# Patient Record
Sex: Female | Born: 1950
Health system: Southern US, Community
[De-identification: ages and names within clinical notes are randomized; demographics above are authoritative.]

## PROBLEM LIST (undated history)

## (undated) DIAGNOSIS — I2510 Atherosclerotic heart disease of native coronary artery without angina pectoris: Secondary | ICD-10-CM

## (undated) DIAGNOSIS — I10 Essential (primary) hypertension: Secondary | ICD-10-CM

## (undated) DIAGNOSIS — M8000XA Age-related osteoporosis with current pathological fracture, unspecified site, initial encounter for fracture: Secondary | ICD-10-CM

## (undated) DIAGNOSIS — C44222 Squamous cell carcinoma of skin of right ear and external auricular canal: Secondary | ICD-10-CM

## (undated) DIAGNOSIS — J449 Chronic obstructive pulmonary disease, unspecified: Secondary | ICD-10-CM

## (undated) DIAGNOSIS — M4850XA Collapsed vertebra, not elsewhere classified, site unspecified, initial encounter for fracture: Secondary | ICD-10-CM

## (undated) HISTORY — DX: Squamous cell carcinoma of skin of right ear and external auricular canal: C44.222

## (undated) HISTORY — DX: Collapsed vertebra, not elsewhere classified, site unspecified, initial encounter for fracture: M48.50XA

## (undated) HISTORY — PX: TONSILLECTOMY: SUR1361

## (undated) HISTORY — DX: Atherosclerotic heart disease of native coronary artery without angina pectoris: I25.10

## (undated) HISTORY — DX: Age-related osteoporosis with current pathological fracture, unspecified site, initial encounter for fracture: M80.00XA

## (undated) HISTORY — PX: EYE SURGERY: SHX253

---

## 2015-02-23 ENCOUNTER — Encounter (HOSPITAL_COMMUNITY): Payer: Self-pay

## 2015-02-23 ENCOUNTER — Observation Stay (HOSPITAL_COMMUNITY)
Admission: EM | Admit: 2015-02-23 | Discharge: 2015-02-24 | Disposition: A | Discharge status: Home / Self Care | Payer: Medicare Other | Attending: Internal Medicine | Admitting: Internal Medicine

## 2015-02-23 ENCOUNTER — Emergency Department (HOSPITAL_COMMUNITY): Payer: Medicare Other

## 2015-02-23 DIAGNOSIS — R109 Unspecified abdominal pain: Principal | ICD-10-CM | POA: Insufficient documentation

## 2015-02-23 DIAGNOSIS — E876 Hypokalemia: Secondary | ICD-10-CM | POA: Diagnosis not present

## 2015-02-23 DIAGNOSIS — Z87891 Personal history of nicotine dependence: Secondary | ICD-10-CM | POA: Diagnosis not present

## 2015-02-23 DIAGNOSIS — R740 Nonspecific elevation of levels of transaminase and lactic acid dehydrogenase [LDH]: Secondary | ICD-10-CM

## 2015-02-23 DIAGNOSIS — R55 Syncope and collapse: Secondary | ICD-10-CM | POA: Diagnosis not present

## 2015-02-23 DIAGNOSIS — R7401 Elevation of levels of liver transaminase levels: Secondary | ICD-10-CM

## 2015-02-23 DIAGNOSIS — Z87442 Personal history of urinary calculi: Secondary | ICD-10-CM | POA: Insufficient documentation

## 2015-02-23 LAB — CBC WITH DIFFERENTIAL/PLATELET
Basophils Absolute: 0 10*3/uL (ref 0.0–0.1)
Basophils Relative: 0 %
Eosinophils Absolute: 0 10*3/uL (ref 0.0–0.7)
Eosinophils Relative: 1 %
HCT: 39.7 % (ref 36.0–46.0)
Hemoglobin: 13.9 g/dL (ref 12.0–15.0)
Lymphocytes Relative: 4 %
Lymphs Abs: 0.2 10*3/uL — ABNORMAL LOW (ref 0.7–4.0)
MCH: 31 pg (ref 26.0–34.0)
MCHC: 35 g/dL (ref 30.0–36.0)
MCV: 88.4 fL (ref 78.0–100.0)
Monocytes Absolute: 0.1 10*3/uL (ref 0.1–1.0)
Monocytes Relative: 2 %
Neutro Abs: 4.1 10*3/uL (ref 1.7–7.7)
Neutrophils Relative %: 93 %
Platelets: 129 10*3/uL — ABNORMAL LOW (ref 150–400)
RBC: 4.49 MIL/uL (ref 3.87–5.11)
RDW: 12.8 % (ref 11.5–15.5)
WBC: 4.4 10*3/uL (ref 4.0–10.5)

## 2015-02-23 LAB — COMPREHENSIVE METABOLIC PANEL
ALT: 352 U/L — ABNORMAL HIGH (ref 14–54)
AST: 446 U/L — ABNORMAL HIGH (ref 15–41)
Albumin: 3.7 g/dL (ref 3.5–5.0)
Alkaline Phosphatase: 142 U/L — ABNORMAL HIGH (ref 38–126)
Anion gap: 11 (ref 5–15)
BUN: 18 mg/dL (ref 6–20)
CO2: 27 mmol/L (ref 22–32)
Calcium: 9 mg/dL (ref 8.9–10.3)
Chloride: 94 mmol/L — ABNORMAL LOW (ref 101–111)
Creatinine, Ser: 0.69 mg/dL (ref 0.44–1.00)
GFR calc Af Amer: >60 mL/min (ref >60)
GFR calc non Af Amer: >60 mL/min (ref >60)
Glucose, Bld: 138 mg/dL — ABNORMAL HIGH (ref 65–99)
Potassium: 3 mmol/L — ABNORMAL LOW (ref 3.5–5.1)
Sodium: 132 mmol/L — ABNORMAL LOW (ref 135–145)
Total Bilirubin: 1 mg/dL (ref 0.3–1.2)
Total Protein: 7.1 g/dL (ref 6.5–8.1)

## 2015-02-23 LAB — URINE MICROSCOPIC-ADD ON
Bacteria, UA: NONE SEEN
Squamous Epithelial / LPF: NONE SEEN
WBC, UA: NONE SEEN WBC/hpf (ref 0–5)

## 2015-02-23 LAB — URINALYSIS, ROUTINE W REFLEX MICROSCOPIC
Glucose, UA: NEGATIVE mg/dL
Hgb urine dipstick: NEGATIVE
Ketones, ur: 40 mg/dL — AB
Leukocytes, UA: NEGATIVE
Nitrite: NEGATIVE
Protein, ur: 30 mg/dL — AB
Specific Gravity, Urine: 1.02 (ref 1.005–1.030)
pH: 6 (ref 5.0–8.0)

## 2015-02-23 LAB — CBG MONITORING, ED: Glucose-Capillary: 130 mg/dL — ABNORMAL HIGH (ref 65–99)

## 2015-02-23 LAB — LACTIC ACID, PLASMA
Lactic Acid, Venous: 2.4 mmol/L (ref 0.5–2.0)
Lactic Acid, Venous: 1.7 mmol/L (ref 0.5–2.0)

## 2015-02-23 LAB — ETHANOL: Alcohol, Ethyl (B): <5 mg/dL (ref <5)

## 2015-02-23 LAB — LIPASE, BLOOD: Lipase: 25 U/L (ref 11–51)

## 2015-02-23 LAB — ACETAMINOPHEN LEVEL: Acetaminophen (Tylenol), Serum: <10 ug/mL — ABNORMAL LOW (ref 10–30)

## 2015-02-23 LAB — TROPONIN I: Troponin I: 0.04 ng/mL — ABNORMAL HIGH (ref <0.031)

## 2015-02-23 MED ORDER — ONDANSETRON HCL 4 MG PO TABS: 4.0000 mg | ORAL_TABLET | Freq: Four times a day (QID) | ORAL | Status: DC | PRN

## 2015-02-23 MED ORDER — POTASSIUM CHLORIDE 10 MEQ/100ML IV SOLN: 10.0000 meq | INTRAVENOUS | Status: DC

## 2015-02-23 MED ORDER — ONDANSETRON HCL 4 MG/2ML IJ SOLN: 4.0000 mg | Freq: Four times a day (QID) | INTRAMUSCULAR | Status: DC | PRN

## 2015-02-23 MED ORDER — ALPRAZOLAM 0.5 MG PO TABS
0.5000 mg | ORAL_TABLET | Freq: Once | ORAL | Status: AC
  Administered 2015-02-24: 0.5 mg via ORAL
  Filled 2015-02-23: qty 1

## 2015-02-23 MED ORDER — SODIUM CHLORIDE 0.9 % IV SOLN
INTRAVENOUS | Status: AC
  Administered 2015-02-23: via INTRAVENOUS

## 2015-02-23 MED ORDER — POTASSIUM CHLORIDE 10 MEQ/100ML IV SOLN
10.0000 meq | Freq: Once | INTRAVENOUS | Status: AC
  Administered 2015-02-23: 10 meq via INTRAVENOUS
  Filled 2015-02-23: qty 100

## 2015-02-23 MED ORDER — ENOXAPARIN SODIUM 40 MG/0.4ML ~~LOC~~ SOLN
40.0000 mg | SUBCUTANEOUS | Status: DC
  Administered 2015-02-24: 40 mg via SUBCUTANEOUS
  Filled 2015-02-23: qty 0.4

## 2015-02-23 MED ORDER — SODIUM CHLORIDE 0.9 % IV SOLN
INTRAVENOUS | Status: DC
  Administered 2015-02-23: 21:00:00 via INTRAVENOUS

## 2015-02-23 MED ORDER — SODIUM CHLORIDE 0.9 % IV SOLN
INTRAVENOUS | Status: DC
  Administered 2015-02-23 – 2015-02-24 (×2): via INTRAVENOUS

## 2015-02-23 MED ORDER — OXYCODONE HCL 5 MG PO TABS: 5.0000 mg | ORAL_TABLET | ORAL | Status: DC | PRN

## 2015-02-23 NOTE — ED Notes (Signed)
MD Thurnell Garbe at bedside.

## 2015-02-23 NOTE — ED Notes (Signed)
I was nauseated, and I have been having pain in my back and abdomen. Possible kidney stones. She has vomited twice and passed out 3 times. The fire department came out and her vital signs were good and we agreed to bring her here.

## 2015-02-23 NOTE — H&P (Signed)
PCP:   No primary care provider on file.   Chief Complaint:  Beth [name] out  HPI:   64 year old female who  has a past medical history of Kidney stone. today came to the hospital with chief complaint of passing out. Patient had 2 episodes of syncope today. The first episode occurred when she was getting up from bed and second episode when her husband and grandson were putting her back to bed. Each episode lasted only for a few seconds. There was no associated seizure-like activity, no tongue biting or loss of bladder or bowel control.  No history of seizures in the past. Patient has been having intermittent nausea and vomiting for past 2 days. She also had right flank pain which they thought was due to kidney stone and was getting Apple Cider vinegar twice a day along with lemon water as recommended by her niece who is herbalist.  She denies any fever. No history of heart problems. Patient does not take any antihypertensive medications.  In the ED lab work revealed transaminitis. And hypokalemia. Patient denies taking excessive Tylenol. Acetaminophen level was less than 10. Alcohol level negative.  Patient also had mild elevation of troponin and lactate of  2.4.   Allergies:   Allergies  Allergen Reactions  . Cefzil [Cefprozil] Other (See Comments)    Burning sensation in lower legs.       Past Medical History  Diagnosis Date  . Kidney stone     Past Surgical History  Procedure Laterality Date  . Tonsillectomy    . Eye surgery      Prior to Admission medications   Medication Sig Start Date End Date Taking? Authorizing Provider  acetaminophen (TYLENOL) 500 MG tablet Take 1,000 mg by mouth every 6 (six) hours as needed for mild pain.   Yes Historical Provider, MD    Social History:  reports that she has quit smoking. She does not have any smokeless tobacco history on file. She reports that she does not drink alcohol. Her drug history is not on file.  No family history on  file.  Filed Weights   02/23/15 1938  Weight: 56.7 kg (125 lb)    All the positives are listed in BOLD  Review of Systems:  HEENT: Headache, blurred vision, runny nose, sore throat Neck: Hypothyroidism, hyperthyroidism,,lymphadenopathy Chest : Shortness of breath, history of COPD, Asthma Heart : Chest pain, history of coronary arterey disease GI:  Nausea, vomiting, diarrhea, constipation, GERD GU: Dysuria, urgency, frequency of urination, hematuria Neuro: Stroke, seizures, syncope Psych: Depression, anxiety, hallucinations   Physical Exam: Blood pressure 113/68, pulse 86, temperature 98.4 F (36.9 C), temperature source Oral, resp. rate 18, height '5\' 4"'$  (1.626 m), weight 56.7 kg (125 lb), SpO2 96 %. Constitutional:   Patient is a well-developed and well-nourished female* in no acute distress and cooperative with exam. Head: Normocephalic and atraumatic Mouth: Mucus membranes moist Eyes: PERRL, EOMI, conjunctivae normal Neck: Supple, No Thyromegaly Cardiovascular: RRR, S1 normal, S2 normal Pulmonary/Chest: CTAB, no wheezes, rales, or rhonchi Abdominal: Soft. Non-tender, non-distended, bowel sounds are normal, no masses, organomegaly, or guarding present.  Neurological: A&O x3, Strength is normal and symmetric bilaterally, cranial nerve II-XII are grossly intact, no focal motor deficit, sensory intact to light touch bilaterally.  Extremities : No Cyanosis, Clubbing or Edema  Labs on Admission:  Basic Metabolic Panel:  Recent Labs Lab 02/23/15 2005  NA 132*  K 3.0*  CL 94*  CO2 27  GLUCOSE 138*  BUN 18  CREATININE 0.69  CALCIUM 9.0   Liver Function Tests:  Recent Labs Lab 02/23/15 2005  AST 446*  ALT 352*  ALKPHOS 142*  BILITOT 1.0  PROT 7.1  ALBUMIN 3.7    Recent Labs Lab 02/23/15 2005  LIPASE 25   No results for input(s): AMMONIA in the last 168 hours. CBC:  Recent Labs Lab 02/23/15 2005  WBC 4.4  NEUTROABS 4.1  HGB 13.9  HCT 39.7  MCV 88.4   PLT 129*   Cardiac Enzymes:  Recent Labs Lab 02/23/15 2005  TROPONINI 0.04*     CBG:  Recent Labs Lab 02/23/15 2015  GLUCAP 130*    Radiological Exams on Admission: Dg Chest 2 View  02/23/2015  CLINICAL DATA:  Nausea and vomiting.  Syncope today. EXAM: CHEST  2 VIEW COMPARISON:  Included lung bases from CT abdomen, performed concurrently. FINDINGS: The lungs are hyperinflated. Linear atelectasis in the right middle lobe. There is a prominent right epicardial fat pad. Heart size is normal. Mild tortuosity of the thoracic aorta. No pulmonary edema, confluent airspace disease, pleural effusion or pneumothorax. Mild compression deformity in the mid thoracic vertebra. IMPRESSION: 1. Hyperinflation.  Linear atelectasis in the right middle lobe. 2. Compression deformity in the midthoracic spine, age indeterminate Electronically Signed   By: Jeb Levering M.D.   On: 02/23/2015 21:02   Ct Renal Stone Study  02/23/2015  CLINICAL DATA:  Nausea and vomiting, bilateral flank pain radiating into mid lower back. EXAM: CT ABDOMEN AND PELVIS WITHOUT CONTRAST TECHNIQUE: Multidetector CT imaging of the abdomen and pelvis was performed following the standard protocol without IV contrast. COMPARISON:  None. FINDINGS: Liver, gallbladder, spleen, pancreas, and adrenal glands are within normal limits for a noncontrast study. Kidneys appear normal without stone or hydronephrosis. No ureteral or bladder calculi identified. Bladder is decompressed but grossly normal in appearance. Fairly extensive diverticulosis noted throughout the sigmoid and lower descending colon but no focal inflammatory change to suggest acute diverticulitis. Bowel is normal in caliber throughout. No bowel wall thickening or evidence of bowel wall inflammation. Appendix is normal. Heavy atherosclerotic changes seen along the walls of the normal-caliber abdominal aorta. No periaortic fluid or edema. No free fluid or abscess collections  seen. No free intraperitoneal air. No enlarged lymph nodes seen. Adnexal regions are unremarkable. Degenerative changes are seen throughout the thoracolumbar spine but no acute osseous abnormality. Mild scarring/ atelectasis noted at each lung base. IMPRESSION: 1. Colonic diverticulosis without evidence of acute diverticulitis. 2. Overall, no evidence of acute intra-abdominal or intrapelvic abnormality. No renal or ureteral calculi. No bowel obstruction or evidence of bowel wall inflammation. No free fluid. No free intraperitoneal air. No evidence of acute solid organ abnormality. Appendix is normal. 3. Degenerative changes of the thoracolumbar spine, moderate in degree. No acute osseous abnormality. 4. Prominent atherosclerotic calcifications of the abdominal aorta. No aortic aneurysm. Electronically Signed   By: Franki Cabot M.D.   On: 02/23/2015 21:03    EKG: Independently reviewed. Normal sinus rhythm   Assessment/Plan Active Problems:   Syncope   Transaminitis   Hypokalemia   Elevated troponin  Syncope  Patient presenting with syncope likely orthostatic from dehydration. We'll check orthostatic vital signs every shift. Will start IV fluid hydration normal saline at 125 mL per hour Check echocardiogram in a.m. Patient has mild elevation of troponin 0.04, will cycle the cardiac enzymes. EKG shows no significant abnormality.  Right flank pain Resolved,? Cause Patient denies pain at this time, CT abdomen pelvis shows no  kidney stone Continue oxycodone when necessary for pain  Elevated lactate Patient has mild elevation of lactate 2.4, likely from dehydration and hypotension. Will repeat lactic acid levels.  Transaminitis Patient has elevated ALT and AST along with alkaline phosphatase , CT abdomen showed no significant abnormality. Will check abdominal ultrasound in a.m.  Hypokalemia  Likely from nausea and vomiting and poor by mouth intake. Will replace potassium and check BMP in  a.m.  DVT prophylaxis Lovenox  Code status: Full code  Family discussion: Admission, patients condition and plan of care including tests being ordered have been discussed with the patient and her husband at bedside* who indicate understanding and agree with the plan and Code Status.   Time Spent on Admission: 60 min  [facility] Pager: [phone] 02/23/2015, 10:26 PM  If 7PM-7AM, please contact night-coverage  [email or web address]  Password [email or web address]

## 2015-02-23 NOTE — ED Notes (Signed)
CRITICAL VALUE ALERT  Critical value received:  Lactic Acid 2.4  Date of notification:  02/23/15  Time of notification:  2110  Critical value read back:Yes.    Nurse who received alert:  JRM  MD notified (1st page):  EDP  Time of first page:  2110  MD notified (2nd page):  Time of second page:  Responding MD:  EDP  Time MD responded:  2110

## 2015-02-23 NOTE — ED Provider Notes (Signed)
CSN: [id number]     Arrival date & time 02/23/15  1933 History   First MD Initiated Contact with Patient 02/23/15 1943     Chief Complaint  Patient presents with  . Loss of Consciousness  . Flank Pain  . Emesis     HPI  Pt was seen at [facility]. Per pt and her family, c/o sudden onset and resolution of 3 separate episodes of syncope that occurred today. Pt states for the past 2 weeks she has had intermittent low back and abd "pain" that she "thought was a kidney stone." Has been associated with several episodes of N/V/D, decreased PO intake, and generalized weakness. Pt's family states they went to check on her and "found her on the floor," s/p syncopal episode. Pt states she "just gets lightheaded" before she "passes out." Denies CP/palpitations, no SOB/cough, no black or blood in stools or emesis, no fevers, no rash, no focal motor weakness, no tingling/numbness in extremities, no slurred speech, no facial droop, no headache, no neck pain.    Past Medical History  Diagnosis Date  . Kidney stone    Past Surgical History  Procedure Laterality Date  . Tonsillectomy    . Eye surgery      Social History  Substance Use Topics  . Smoking status: Former Research scientist (life sciences)  . Smokeless tobacco: None  . Alcohol Use: No    Review of Systems ROS: Statement: All systems negative except as marked or noted in the HPI; Constitutional: Negative for fever and chills. ; ; Eyes: Negative for eye pain, redness and discharge. ; ; ENMT: Negative for ear pain, hoarseness, nasal congestion, sinus pressure and sore throat. ; ; Cardiovascular: Negative for chest pain, palpitations, diaphoresis, dyspnea and peripheral edema. ; ; Respiratory: Negative for cough, wheezing and stridor. ; ; Gastrointestinal: +N/V/D, abd pain. Negative for blood in stool, hematemesis, jaundice and rectal bleeding. . ; ; Genitourinary: Negative for dysuria and hematuria. ; ; Musculoskeletal: +LBP. Negative for neck pain. Negative for swelling and  deformity..; ; Skin: Negative for pruritus, rash, abrasions, blisters, bruising and skin lesion.; ; Neuro: Negative for headache and neck stiffness. Negative for extremity weakness, paresthesias, involuntary movement, seizure and +lightheadedness, +syncope.      Allergies  Cefzil  Home Medications   Prior to Admission medications   Not on File   BP 106/62 mmHg  Pulse 91  Temp(Src) 98.4 F (36.9 C) (Oral)  Resp 23  Ht '5\' 4"'$  (1.626 m)  Wt 125 lb (56.7 kg)  BMI 21.45 kg/m2  SpO2 97%   20:06 Orthostatic Vital Signs AH  Orthostatic Lying  - BP- Lying: 115/59 mmHg ; Pulse- Lying: 86  Orthostatic Sitting - BP- Sitting: 106/62 mmHg ; Pulse- Sitting: 90  Orthostatic Standing at 0 minutes - BP- Standing at 0 minutes: 110/57 mmHg ; Pulse- Standing at 0 minutes: 108       Physical Exam  1950: Physical examination:  Nursing notes reviewed; Vital signs and O2 SAT reviewed;  Constitutional: Well developed, Well nourished, In no acute distress; Head:  Normocephalic, atraumatic; Eyes: EOMI, PERRL, No scleral icterus; ENMT: Mouth and pharynx normal, Mucous membranes dry; Neck: Supple, Full range of motion, No lymphadenopathy; Cardiovascular: Regular rate and rhythm, No gallop; Respiratory: Breath sounds clear & equal bilaterally, No wheezes.  Speaking full sentences with ease, Normal respiratory effort/excursion; Chest: Nontender, Movement normal; Abdomen: Soft, Nontender, Nondistended, Normal bowel sounds; Genitourinary: No CVA tenderness; Spine:  No midline CS, TS, LS tenderness. No rash.;; Extremities: Pulses normal,  No tenderness, No edema, No calf edema or asymmetry.; Neuro: AA&Ox3, Major CN grossly intact. No facial droop. Speech clear. No gross focal motor or sensory deficits in extremities.; Skin: Color normal, Warm, Dry.   ED Course  Procedures (including critical care time) Labs Review   Imaging Review  I have personally reviewed and evaluated these images and lab results as part of  my medical decision-making.   EKG Interpretation   Date/Time:  Saturday February 23 2015 20:05:27 EST Ventricular Rate:  87 PR Interval:  117 QRS Duration: 99 QT Interval:  377 QTC Calculation: 453 R Axis:   75 Text Interpretation:  Sinus rhythm Borderline short PR interval No old  tracing to compare Confirmed by [facility]  MD, Nunzio Cory [phone]) on  02/23/2015 8:28:35 PM      MDM  MDM Reviewed: nursing note and vitals Interpretation: ECG, labs, x-ray and CT scan      Results for orders placed or performed during the hospital encounter of 02/23/15  Urinalysis, Routine w reflex microscopic  Result Value Ref Range   Color, Urine YELLOW YELLOW   APPearance CLEAR CLEAR   Specific Gravity, Urine 1.020 1.005 - 1.030   pH 6.0 5.0 - 8.0   Glucose, UA NEGATIVE NEGATIVE mg/dL   Hgb urine dipstick NEGATIVE NEGATIVE   Bilirubin Urine MODERATE (A) NEGATIVE   Ketones, ur 40 (A) NEGATIVE mg/dL   Protein, ur 30 (A) NEGATIVE mg/dL   Nitrite NEGATIVE NEGATIVE   Leukocytes, UA NEGATIVE NEGATIVE  Comprehensive metabolic panel  Result Value Ref Range   Sodium 132 (L) 135 - 145 mmol/L   Potassium 3.0 (L) 3.5 - 5.1 mmol/L   Chloride 94 (L) 101 - 111 mmol/L   CO2 27 22 - 32 mmol/L   Glucose, Bld 138 (H) 65 - 99 mg/dL   BUN 18 6 - 20 mg/dL   Creatinine, Ser 0.69 0.44 - 1.00 mg/dL   Calcium 9.0 8.9 - 10.3 mg/dL   Total Protein 7.1 6.5 - 8.1 g/dL   Albumin 3.7 3.5 - 5.0 g/dL   AST 446 (H) 15 - 41 U/L   ALT 352 (H) 14 - 54 U/L   Alkaline Phosphatase 142 (H) 38 - 126 U/L   Total Bilirubin 1.0 0.3 - 1.2 mg/dL   GFR calc non Af Amer >60 >60 mL/min   GFR calc Af Amer >60 >60 mL/min   Anion gap 11 5 - 15  Lipase, blood  Result Value Ref Range   Lipase 25 11 - 51 U/L  Troponin I  Result Value Ref Range   Troponin I 0.04 (H) <0.031 ng/mL  Lactic acid, plasma  Result Value Ref Range   Lactic Acid, Venous 2.4 (HH) 0.5 - 2.0 mmol/L  CBC with Differential  Result Value Ref Range   WBC 4.4  4.0 - 10.5 K/uL   RBC 4.49 3.87 - 5.11 MIL/uL   Hemoglobin 13.9 12.0 - 15.0 g/dL   HCT 39.7 36.0 - 46.0 %   MCV 88.4 78.0 - 100.0 fL   MCH 31.0 26.0 - 34.0 pg   MCHC 35.0 30.0 - 36.0 g/dL   RDW 12.8 11.5 - 15.5 %   Platelets 129 (L) 150 - 400 K/uL   Neutrophils Relative % 93 %   Neutro Abs 4.1 1.7 - 7.7 K/uL   Lymphocytes Relative 4 %   Lymphs Abs 0.2 (L) 0.7 - 4.0 K/uL   Monocytes Relative 2 %   Monocytes Absolute 0.1 0.1 - 1.0 K/uL  Eosinophils Relative 1 %   Eosinophils Absolute 0.0 0.0 - 0.7 K/uL   Basophils Relative 0 %   Basophils Absolute 0.0 0.0 - 0.1 K/uL  Urine microscopic-add on  Result Value Ref Range   Squamous Epithelial / LPF NONE SEEN NONE SEEN   WBC, UA NONE SEEN 0 - 5 WBC/hpf   RBC / HPF 0-5 0 - 5 RBC/hpf   Bacteria, UA NONE SEEN NONE SEEN   Urine-Other MUCOUS PRESENT   CBG monitoring, ED  Result Value Ref Range   Glucose-Capillary 130 (H) 65 - 99 mg/dL   Comment 1 Notify RN    Comment 2 Document in Chart    Dg Chest 2 View 02/23/2015  CLINICAL DATA:  Nausea and vomiting.  Syncope today. EXAM: CHEST  2 VIEW COMPARISON:  Included lung bases from CT abdomen, performed concurrently. FINDINGS: The lungs are hyperinflated. Linear atelectasis in the right middle lobe. There is a prominent right epicardial fat pad. Heart size is normal. Mild tortuosity of the thoracic aorta. No pulmonary edema, confluent airspace disease, pleural effusion or pneumothorax. Mild compression deformity in the mid thoracic vertebra. IMPRESSION: 1. Hyperinflation.  Linear atelectasis in the right middle lobe. 2. Compression deformity in the midthoracic spine, age indeterminate Electronically Signed   By: Jeb Levering M.D.   On: 02/23/2015 21:02   Ct Renal Stone Study 02/23/2015  CLINICAL DATA:  Nausea and vomiting, bilateral flank pain radiating into mid lower back. EXAM: CT ABDOMEN AND PELVIS WITHOUT CONTRAST TECHNIQUE: Multidetector CT imaging of the abdomen and pelvis was performed  following the standard protocol without IV contrast. COMPARISON:  None. FINDINGS: Liver, gallbladder, spleen, pancreas, and adrenal glands are within normal limits for a noncontrast study. Kidneys appear normal without stone or hydronephrosis. No ureteral or bladder calculi identified. Bladder is decompressed but grossly normal in appearance. Fairly extensive diverticulosis noted throughout the sigmoid and lower descending colon but no focal inflammatory change to suggest acute diverticulitis. Bowel is normal in caliber throughout. No bowel wall thickening or evidence of bowel wall inflammation. Appendix is normal. Heavy atherosclerotic changes seen along the walls of the normal-caliber abdominal aorta. No periaortic fluid or edema. No free fluid or abscess collections seen. No free intraperitoneal air. No enlarged lymph nodes seen. Adnexal regions are unremarkable. Degenerative changes are seen throughout the thoracolumbar spine but no acute osseous abnormality. Mild scarring/ atelectasis noted at each lung base. IMPRESSION: 1. Colonic diverticulosis without evidence of acute diverticulitis. 2. Overall, no evidence of acute intra-abdominal or intrapelvic abnormality. No renal or ureteral calculi. No bowel obstruction or evidence of bowel wall inflammation. No free fluid. No free intraperitoneal air. No evidence of acute solid organ abnormality. Appendix is normal. 3. Degenerative changes of the thoracolumbar spine, moderate in degree. No acute osseous abnormality. 4. Prominent atherosclerotic calcifications of the abdominal aorta. No aortic aneurysm. Electronically Signed   By: Franki Cabot M.D.   On: 02/23/2015 21:03    2145:  Potassium repleted IV. LFT's elevated, but pt denies overuse of etoh or APAP; additional labs ordered. Tropoin mildly elevated, but EKG without acute STTW changes and pt denies CP. Judicious IVF given for mildly elevated lactic acid. Dx and testing d/w pt and family.  Questions answered.   Verb understanding, agreeable to observation admit.  T/C to [facility] Dr. Darrick Meigs, case discussed, including:  HPI, pertinent PM/SHx, VS/PE, dx testing, ED course and treatment:  Agreeable to admit, requests to write temporary orders, obtain observation tele bed to team APAdmits.  Francine Graven, DO 02/26/15 2025

## 2015-02-24 ENCOUNTER — Observation Stay (HOSPITAL_COMMUNITY): Payer: Medicare Other

## 2015-02-24 DIAGNOSIS — R55 Syncope and collapse: Secondary | ICD-10-CM | POA: Diagnosis not present

## 2015-02-24 DIAGNOSIS — E876 Hypokalemia: Secondary | ICD-10-CM | POA: Diagnosis not present

## 2015-02-24 DIAGNOSIS — R740 Nonspecific elevation of levels of transaminase and lactic acid dehydrogenase [LDH]: Secondary | ICD-10-CM | POA: Diagnosis not present

## 2015-02-24 LAB — CBC
HCT: 41.8 % (ref 36.0–46.0)
Hemoglobin: 13.9 g/dL (ref 12.0–15.0)
MCH: 30 pg (ref 26.0–34.0)
MCHC: 33.3 g/dL (ref 30.0–36.0)
MCV: 90.1 fL (ref 78.0–100.0)
Platelets: 134 10*3/uL — ABNORMAL LOW (ref 150–400)
RBC: 4.64 MIL/uL (ref 3.87–5.11)
RDW: 13.2 % (ref 11.5–15.5)
WBC: 3.3 10*3/uL — ABNORMAL LOW (ref 4.0–10.5)

## 2015-02-24 LAB — COMPREHENSIVE METABOLIC PANEL
ALT: 363 U/L — ABNORMAL HIGH (ref 14–54)
AST: 424 U/L — ABNORMAL HIGH (ref 15–41)
Albumin: 3.5 g/dL (ref 3.5–5.0)
Alkaline Phosphatase: 125 U/L (ref 38–126)
Anion gap: 9 (ref 5–15)
BUN: 13 mg/dL (ref 6–20)
CO2: 23 mmol/L (ref 22–32)
Calcium: 8.5 mg/dL — ABNORMAL LOW (ref 8.9–10.3)
Chloride: 107 mmol/L (ref 101–111)
Creatinine, Ser: 0.65 mg/dL (ref 0.44–1.00)
GFR calc Af Amer: >60 mL/min (ref >60)
GFR calc non Af Amer: >60 mL/min (ref >60)
Glucose, Bld: 88 mg/dL (ref 65–99)
Potassium: 4.2 mmol/L (ref 3.5–5.1)
Sodium: 139 mmol/L (ref 135–145)
Total Bilirubin: 0.5 mg/dL (ref 0.3–1.2)
Total Protein: 6.8 g/dL (ref 6.5–8.1)

## 2015-02-24 LAB — TROPONIN I
Troponin I: 0.03 ng/mL (ref <0.031)
Troponin I: 0.03 ng/mL (ref <0.031)
Troponin I: <0.03 ng/mL (ref <0.031)

## 2015-02-24 LAB — MAGNESIUM: Magnesium: 1.8 mg/dL (ref 1.7–2.4)

## 2015-02-24 MED ORDER — POTASSIUM CHLORIDE CRYS ER 20 MEQ PO TBCR
40.0000 meq | EXTENDED_RELEASE_TABLET | ORAL | Status: AC
  Administered 2015-02-24 (×2): 40 meq via ORAL
  Filled 2015-02-24 (×2): qty 2

## 2015-02-24 NOTE — Discharge Instructions (Signed)
Discontinue use of Tylenol indefinitely.

## 2015-02-24 NOTE — Progress Notes (Signed)
Patient d/c home  before able to sign.

## 2015-02-24 NOTE — Discharge Summary (Signed)
Physician Discharge Summary  Beth [name] FAO:[id number] DOB: 08-03-1950 DOA: 02/23/2015  PCP: No primary care provider on file.  Admit date: 02/23/2015 Discharge date: 02/24/2015  Time spent: 45 minutes  Recommendations for Outpatient Follow-up:  -Will be discharged home today. -Advised to follow-up with primary care provider in 2 weeks at which time her liver function tests should be evaluated to ensure downward trend.   Discharge Diagnoses:  Active Problems:   Syncope   Transaminitis   Hypokalemia   Discharge Condition: Stable and improved  Filed Weights   02/23/15 1938 02/23/15 2342  Weight: 56.7 kg (125 lb) 56.7 kg (125 lb)    History of present illness:  As per Dr. Darrick Meigs 54/60: 64 year old female who  has a past medical history of Kidney stone. today came to the hospital with chief complaint of passing out. Patient had 2 episodes of syncope today. The first episode occurred when she was getting up from bed and second episode when her husband and grandson were putting her back to bed. Each episode lasted only for a few seconds. There was no associated seizure-like activity, no tongue biting or loss of bladder or bowel control.  No history of seizures in the past. Patient has been having intermittent nausea and vomiting for past 2 days. She also had right flank pain which they thought was due to kidney stone and was getting Apple Cider vinegar twice a day along with lemon water as recommended by her niece who is herbalist.  She denies any fever. No history of heart problems. Patient does not take any antihypertensive medications.  In the ED lab work revealed transaminitis. And hypokalemia. Patient denies taking excessive Tylenol. Acetaminophen level was less than 10. Alcohol level negative.  Patient also had mild elevation of troponin and lactate of 2.4.    Hospital Course:   Syncopal episode -Has had no arrhythmias on telemetry. -Suspect her syncope was related  to hypotension and orthostasis from dehydration. -Blood pressure has normalized, she is ambulating without dizziness or gait imbalance. -2-D echocardiogram has been done however results are pending at time of discharge, I do not anticipate any abnormalities given lack of findings on chest auscultation.  Transaminitis -Abdominal ultrasound shows fatty liver. -AST and ALT in the 400s and 300s respectively. -Suspect related to hypotension as well. -We'll need close follow-up in the outpatient setting. -Advised to cease Tylenol use.  Elevated lactic acid -Suspect related to dehydration and hypotension culminating in poor perfusion, resolved.  Procedures:  None   Consultations:  None  Discharge Instructions      Discharge Instructions    Increase activity slowly    Complete by:  As directed             Medication List    STOP taking these medications        acetaminophen 500 MG tablet  Commonly known as:  TYLENOL       Allergies  Allergen Reactions  . Cefzil [Cefprozil] Other (See Comments)    Burning sensation in lower legs.    Follow-up Information    Schedule an appointment as soon as possible for a visit in 2 weeks to follow up.   Why:  with your regular provider       The results of significant diagnostics from this hospitalization (including imaging, microbiology, ancillary and laboratory) are listed below for reference.    Significant Diagnostic Studies: Dg Chest 2 View  02/23/2015  CLINICAL DATA:  Nausea and vomiting.  Syncope today.  EXAM: CHEST  2 VIEW COMPARISON:  Included lung bases from CT abdomen, performed concurrently. FINDINGS: The lungs are hyperinflated. Linear atelectasis in the right middle lobe. There is a prominent right epicardial fat pad. Heart size is normal. Mild tortuosity of the thoracic aorta. No pulmonary edema, confluent airspace disease, pleural effusion or pneumothorax. Mild compression deformity in the mid thoracic vertebra.  IMPRESSION: 1. Hyperinflation.  Linear atelectasis in the right middle lobe. 2. Compression deformity in the midthoracic spine, age indeterminate Electronically Signed   By: Jeb Levering M.D.   On: 02/23/2015 21:02   US Abdomen Complete  02/24/2015  CLINICAL DATA:  Bilateral flank pain. Nausea and vomiting for 2 days. EXAM: ULTRASOUND ABDOMEN COMPLETE COMPARISON:  CT of the abdomen and pelvis 02/23/2015 FINDINGS: Gallbladder: No gallstones or wall thickening visualized. No sonographic Murphy sign noted. Maximal wall thickness is 1.8 mm, within normal limits. Common bile duct: Diameter: 1.8 mm, within normal limits Liver: The liver is mildly hyperechoic. No discrete lesions are present. IVC: No abnormality visualized. Pancreas: Visualized portion unremarkable. Spleen: The spleen is of normal size and echotexture measuring 7.6 cm maximally. Right Kidney: Length: 9.9 cm, within normal limits. Echogenicity within normal limits. No mass or hydronephrosis visualized. Left Kidney: Length: 9.9 cm, within normal limits. Echogenicity within normal limits. No mass or hydronephrosis visualized. Abdominal aorta: No aneurysm visualized. Other findings: None. IMPRESSION: 1. Mild fatty infiltration of liver. 2. No other acute or focal lesion to explain the patient's symptoms. Electronically Signed   By: San Morelle M.D.   On: 02/24/2015 14:07   Ct Renal Stone Study  02/23/2015  CLINICAL DATA:  Nausea and vomiting, bilateral flank pain radiating into mid lower back. EXAM: CT ABDOMEN AND PELVIS WITHOUT CONTRAST TECHNIQUE: Multidetector CT imaging of the abdomen and pelvis was performed following the standard protocol without IV contrast. COMPARISON:  None. FINDINGS: Liver, gallbladder, spleen, pancreas, and adrenal glands are within normal limits for a noncontrast study. Kidneys appear normal without stone or hydronephrosis. No ureteral or bladder calculi identified. Bladder is decompressed but grossly normal in  appearance. Fairly extensive diverticulosis noted throughout the sigmoid and lower descending colon but no focal inflammatory change to suggest acute diverticulitis. Bowel is normal in caliber throughout. No bowel wall thickening or evidence of bowel wall inflammation. Appendix is normal. Heavy atherosclerotic changes seen along the walls of the normal-caliber abdominal aorta. No periaortic fluid or edema. No free fluid or abscess collections seen. No free intraperitoneal air. No enlarged lymph nodes seen. Adnexal regions are unremarkable. Degenerative changes are seen throughout the thoracolumbar spine but no acute osseous abnormality. Mild scarring/ atelectasis noted at each lung base. IMPRESSION: 1. Colonic diverticulosis without evidence of acute diverticulitis. 2. Overall, no evidence of acute intra-abdominal or intrapelvic abnormality. No renal or ureteral calculi. No bowel obstruction or evidence of bowel wall inflammation. No free fluid. No free intraperitoneal air. No evidence of acute solid organ abnormality. Appendix is normal. 3. Degenerative changes of the thoracolumbar spine, moderate in degree. No acute osseous abnormality. 4. Prominent atherosclerotic calcifications of the abdominal aorta. No aortic aneurysm. Electronically Signed   By: Franki Cabot M.D.   On: 02/23/2015 21:03    Microbiology: No results found for this or any previous visit (from the past 240 hour(s)).   Labs: Basic Metabolic Panel:  Recent Labs Lab 02/23/15 2005 02/24/15 0333 02/24/15 0627  NA 132*  --  139  K 3.0*  --  4.2  CL 94*  --  107  CO2 27  --  23  GLUCOSE 138*  --  88  BUN 18  --  13  CREATININE 0.69  --  0.65  CALCIUM 9.0  --  8.5*  MG  --  1.8  --    Liver Function Tests:  Recent Labs Lab 02/23/15 2005 02/24/15 0627  AST 446* 424*  ALT 352* 363*  ALKPHOS 142* 125  BILITOT 1.0 0.5  PROT 7.1 6.8  ALBUMIN 3.7 3.5    Recent Labs Lab 02/23/15 2005  LIPASE 25   No results for  input(s): AMMONIA in the last 168 hours. CBC:  Recent Labs Lab 02/23/15 2005 02/24/15 0627  WBC 4.4 3.3*  NEUTROABS 4.1  --   HGB 13.9 13.9  HCT 39.7 41.8  MCV 88.4 90.1  PLT 129* 134*   Cardiac Enzymes:  Recent Labs Lab 02/23/15 2005 02/24/15 0333 02/24/15 0627 02/24/15 1205  TROPONINI 0.04* 0.03 0.03 <0.03   BNP: BNP (last 3 results) No results for input(s): BNP in the last 8760 hours.  ProBNP (last 3 results) No results for input(s): PROBNP in the last 8760 hours.  CBG:  Recent Labs Lab 02/23/15 2015  GLUCAP 130*       Signed:  HERNANDEZ ACOSTA,ESTELA  [facility] Pager: [phone] 02/24/2015, 3:16 PM

## 2015-02-24 NOTE — Progress Notes (Signed)
Discharged PT per MD order and protocol. Reviewed discharge teaching and handouts given. Pt verbalized understanding and left with all belongings. VSS. IV catheter D/C.  Patient wheeled down by staff member. Oswald Hillock, RN

## 2015-02-25 LAB — HEPATITIS PANEL, ACUTE
HCV Ab: <0.1 s/co ratio (ref 0.0–0.9)
Hep A IgM: NEGATIVE
Hep B C IgM: NEGATIVE
Hepatitis B Surface Ag: NEGATIVE

## 2015-12-09 DIAGNOSIS — Z23 Encounter for immunization: Secondary | ICD-10-CM | POA: Diagnosis not present

## 2017-01-25 ENCOUNTER — Ambulatory Visit (INDEPENDENT_AMBULATORY_CARE_PROVIDER_SITE_OTHER): Payer: Medicare Other | Admitting: Physician Assistant

## 2017-01-25 ENCOUNTER — Other Ambulatory Visit: Payer: Self-pay

## 2017-01-25 ENCOUNTER — Encounter: Payer: Self-pay | Admitting: Physician Assistant

## 2017-01-25 ENCOUNTER — Ambulatory Visit (INDEPENDENT_AMBULATORY_CARE_PROVIDER_SITE_OTHER): Payer: Medicare Other

## 2017-01-25 VITALS — BP 150/98 | HR 102 | Temp 98.0°F | Resp 16 | Ht 62.5 in | Wt 123.4 lb

## 2017-01-25 DIAGNOSIS — R059 Cough, unspecified: Secondary | ICD-10-CM

## 2017-01-25 DIAGNOSIS — R05 Cough: Secondary | ICD-10-CM | POA: Diagnosis not present

## 2017-01-25 DIAGNOSIS — J209 Acute bronchitis, unspecified: Secondary | ICD-10-CM | POA: Diagnosis not present

## 2017-01-25 MED ORDER — AZITHROMYCIN 250 MG PO TABS: ORAL_TABLET | ORAL | 0 refills | Status: DC

## 2017-01-25 MED ORDER — PREDNISONE 20 MG PO TABS: ORAL_TABLET | ORAL | 0 refills | Status: DC

## 2017-01-25 MED ORDER — BENZONATATE 100 MG PO CAPS: 100.0000 mg | ORAL_CAPSULE | Freq: Three times a day (TID) | ORAL | 0 refills | Status: DC | PRN

## 2017-01-25 NOTE — Progress Notes (Signed)
Beth [name]  MRN: [id number] DOB: September 30, 1950  PCP: Patient, No Pcp Per  Subjective:  Pt is a 66 year old female who presents to clinic for cough x > one week.  Endorses shob. She is taking chloricedin and tylenol. Denies fever, chills, night sweats, n/v.  H/o chronic bronchitis, no treatment.  She lives in [place] and is having a very hard time finding a PCP.   Review of Systems  Constitutional: Negative for chills, diaphoresis, fatigue and fever.  Respiratory: Positive for cough and shortness of breath. Negative for chest tightness and wheezing.   Cardiovascular: Negative for chest pain and palpitations.    Patient Active Problem List   Diagnosis Date Noted  . Faintness   . Syncope 02/23/2015  . Transaminitis 02/23/2015  . Hypokalemia 02/23/2015    Current Outpatient Medications on File Prior to Visit  Medication Sig Dispense Refill  . acetaminophen (TYLENOL) 500 MG tablet Take 500 mg every 6 (six) hours as needed by mouth.     No current facility-administered medications on file prior to visit.     Allergies  Allergen Reactions  . Cefzil [Cefprozil] Other (See Comments)    Burning sensation in lower legs.      Objective:  BP (!) 184/98   Pulse (!) 105   Temp 98 F (36.7 C) (Oral)   Resp 16   Ht 5' 2.5" (1.588 m)   Wt 123 lb 6.4 oz (56 kg)   SpO2 96%   BMI 22.21 kg/m   Physical Exam  Constitutional: She is oriented to person, place, and time and well-developed, well-nourished, and in no distress. No distress.  HENT:  Right Ear: Tympanic membrane normal.  Left Ear: Tympanic membrane normal.  Mouth/Throat: Oropharynx is clear and moist and mucous membranes are normal.  Cardiovascular: Normal rate, regular rhythm and normal heart sounds.  Pulmonary/Chest: Effort normal and breath sounds normal. She has no wheezes. She has no rales.  Neurological: She is alert and oriented to person, place, and time. GCS score is 15.  Skin: Skin is warm and dry.    Psychiatric: Mood, memory, affect and judgment normal.  Vitals reviewed.   Dg Chest 2 View  Result Date: 01/25/2017 CLINICAL DATA:  Cough for 10 days. EXAM: CHEST  2 VIEW COMPARISON:  02/23/2015 FINDINGS: The cardiac silhouette, mediastinal and hilar contours are normal in stable. There is tortuosity and calcification of the thoracic aorta. Prominent epicardial fat noted on the right side. The lungs are clear. No infiltrates, edema or effusions. No worrisome pulmonary lesions. Mild chronic emphysematous changes are noted with hyperinflation. The bony thorax is intact. Stable midthoracic compression deformities. IMPRESSION: Chronic emphysematous changes. No acute cardiopulmonary findings. Electronically Signed   By: Marijo Sanes M.D.   On: 01/25/2017 12:22    Assessment and Plan :  1. Cough - DG Chest 2 View; Future - predniSONE (DELTASONE) 20 MG tablet; Take 3 PO QAM x3days, 2 PO QAM x3days, 1 PO QAM x3days  Dispense: 18 tablet; Refill: 0 - benzonatate (TESSALON) 100 MG capsule; Take 1-2 capsules (100-200 mg total) 3 (three) times daily as needed by mouth for cough.  Dispense: 40 capsule; Refill: 0 2. Acute bronchitis, unspecified organism - azithromycin (ZITHROMAX) 250 MG tablet; Take 2 tabs PO x 1 dose, then 1 tab PO QD x 4 days  Dispense: 6 tablet; Refill: 0 - Negative chest x-ray. Worsening cough and tachycardia. Plan to cover with Azithromycin. Stay well hydrated. RTC in 5-7 days if no improvement. She  understands and agrees with plan.    Mercer Pod, PA-C  [facility] 01/25/2017 11:46 AM

## 2017-01-25 NOTE — Patient Instructions (Addendum)
Come back if you are not improving in 5-7 days. Stay well hydrated. Get lost of rest.  Take your blood pressure a few times a week. If it is >140/90, come back and we will start blood pressure medications (see below for DASH diet)  Advil or ibuprofen for pain. Do not take Aspirin.  Throat lozenges (if you are not at risk for choking) or sprays may be used to soothe your throat. Drink enough water and fluids to keep your urine clear or pale yellow. For sore throat: ? Gargle with 8 oz of salt water ( tsp of salt per 1 qt of water) as often as every 1-2 hours to soothe your throat.  Gargle liquid benadryl.  Use Elderberry syrup.   For sore throat try using a honey-based tea. Use 3 teaspoons of honey with juice squeezed from half lemon. Place shaved pieces of ginger into 1/2-1 cup of water and warm over stove top. Then mix the ingredients and repeat every 4 hours as needed.  Cough Syrup Recipe: Sweet Lemon & Honey Thyme  Ingredients a handful of fresh thyme sprigs   1 pint of water (2 cups)  1/2 cup honey (raw is best, but regular will do)  1/2 lemon chopped Instructions 1. Place the lemon in the pint jar and cover with the honey. The honey will macerate the lemons and draw out liquids which taste so delicious! 2. Meanwhile, toss the thyme leaves into a saucepan and cover them with the water. 3. Bring the water to a gentle simmer and reduce it to half, about a cup of tea. 4. When the tea is reduced and cooled a bit, strain the sprigs & leaves, add it into the pint jar and stir it well. 5. Give it a shake and use a spoonful as needed. 6. Store your homemade cough syrup in the refrigerator for about a month.  What causes a cough? In adults, common causes of a cough include: ?An infection of the airways or lungs (such as the common cold) ?Postnasal drip - Postnasal drip is when mucus from the nose drips down or flows along the back of the throat. Postnasal drip can happen when people  have: .A cold .Allergies .A sinus infection - The sinuses are hollow areas in the bones of the face that open into the nose. ?Lung conditions, like asthma and chronic obstructive pulmonary disease (COPD) - Both of these conditions can make it hard to breathe. COPD is usually caused by smoking. ?Acid reflux - Acid reflux is when the acid that is normally in your stomach backs up into your esophagus (the tube that carries food from your mouth to your stomach). ?A side effect from blood pressure medicines called "ACE inhibitors" ?Smoking cigarettes  Is there anything I can do on my own to get rid of my cough? Yes. To help get rid of your cough, you can: ?Use a humidifier in your bedroom ?Use an over-the-counter cough medicine, or suck on cough drops or hard candy ?Stop smoking, if you smoke ?If you have allergies, avoid the things you are allergic to (like pollen, dust, animals, or mold) If you have acid reflux, your doctor or nurse will tell you which lifestyle changes can help reduce symptoms.   DASH Eating Plan DASH stands for "Dietary Approaches to Stop Hypertension." The DASH eating plan is a healthy eating plan that has been shown to reduce high blood pressure (hypertension). It may also reduce your risk for type 2 diabetes, heart disease,  and stroke. The DASH eating plan may also help with weight loss. What are tips for following this plan? General guidelines  Avoid eating more than 2,300 mg (milligrams) of salt (sodium) a day. If you have hypertension, you may need to reduce your sodium intake to 1,500 mg a day.  Limit alcohol intake to no more than 1 drink a day for nonpregnant women and 2 drinks a day for men. One drink equals 12 oz of beer, 5 oz of wine, or 1 oz of hard liquor.  Work with your health care provider to maintain a healthy body weight or to lose weight. Ask what an ideal weight is for you.  Get at least 30 minutes of exercise that causes your heart to beat faster  (aerobic exercise) most days of the week. Activities may include walking, swimming, or biking.  Work with your health care provider or diet and nutrition specialist (dietitian) to adjust your eating plan to your individual calorie needs. Reading food labels  Check food labels for the amount of sodium per serving. Choose foods with less than 5 percent of the Daily Value of sodium. Generally, foods with less than 300 mg of sodium per serving fit into this eating plan.  To find whole grains, look for the word "whole" as the first word in the ingredient list. Shopping  Buy products labeled as "low-sodium" or "no salt added."  Buy fresh foods. Avoid canned foods and premade or frozen meals. Cooking  Avoid adding salt when cooking. Use salt-free seasonings or herbs instead of table salt or sea salt. Check with your health care provider or pharmacist before using salt substitutes.  Do not fry foods. Cook foods using healthy methods such as baking, boiling, grilling, and broiling instead.  Cook with heart-healthy oils, such as olive, canola, soybean, or sunflower oil. Meal planning   Eat a balanced diet that includes: ? 5 or more servings of fruits and vegetables each day. At each meal, try to fill half of your plate with fruits and vegetables. ? Up to 6-8 servings of whole grains each day. ? Less than 6 oz of lean meat, poultry, or fish each day. A 3-oz serving of meat is about the same size as a deck of cards. One egg equals 1 oz. ? 2 servings of low-fat dairy each day. ? A serving of nuts, seeds, or beans 5 times each week. ? Heart-healthy fats. Healthy fats called Omega-3 fatty acids are found in foods such as flaxseeds and coldwater fish, like sardines, salmon, and mackerel.  Limit how much you eat of the following: ? Canned or prepackaged foods. ? Food that is high in trans fat, such as fried foods. ? Food that is high in saturated fat, such as fatty meat. ? Sweets, desserts, sugary  drinks, and other foods with added sugar. ? Full-fat dairy products.  Do not salt foods before eating.  Try to eat at least 2 vegetarian meals each week.  Eat more home-cooked food and less restaurant, buffet, and fast food.  When eating at a restaurant, ask that your food be prepared with less salt or no salt, if possible. What foods are recommended? The items listed may not be a complete list. Talk with your dietitian about what dietary choices are best for you. Grains Whole-grain or whole-wheat bread. Whole-grain or whole-wheat pasta. Brown rice. [place]. Bulgur. Whole-grain and low-sodium cereals. Pita bread. Low-fat, low-sodium crackers. Whole-wheat flour tortillas. Vegetables Fresh or frozen vegetables (raw, steamed, roasted, or  grilled). Low-sodium or reduced-sodium tomato and vegetable juice. Low-sodium or reduced-sodium tomato sauce and tomato paste. Low-sodium or reduced-sodium canned vegetables. Fruits All fresh, dried, or frozen fruit. Canned fruit in natural juice (without added sugar). Meat and other protein foods Skinless chicken or [place]. Ground chicken or [place]. Pork with fat trimmed off. Fish and seafood. Egg whites. Dried beans, peas, or lentils. Unsalted nuts, nut butters, and seeds. Unsalted canned beans. Lean cuts of beef with fat trimmed off. Low-sodium, lean deli meat. Dairy Low-fat (1%) or fat-free (skim) milk. Fat-free, low-fat, or reduced-fat cheeses. Nonfat, low-sodium ricotta or cottage cheese. Low-fat or nonfat yogurt. Low-fat, low-sodium cheese. Fats and oils Soft margarine without trans fats. Vegetable oil. Low-fat, reduced-fat, or light mayonnaise and salad dressings (reduced-sodium). Canola, safflower, olive, soybean, and sunflower oils. Avocado. Seasoning and other foods Herbs. Spices. Seasoning mixes without salt. Unsalted popcorn and pretzels. Fat-free sweets. What foods are not recommended? The items listed may not be a complete list. Talk  with your dietitian about what dietary choices are best for you. Grains Baked goods made with fat, such as croissants, muffins, or some breads. Dry pasta or rice meal packs. Vegetables Creamed or fried vegetables. Vegetables in a cheese sauce. Regular canned vegetables (not low-sodium or reduced-sodium). Regular canned tomato sauce and paste (not low-sodium or reduced-sodium). Regular tomato and vegetable juice (not low-sodium or reduced-sodium). Angie Fava. Olives. Fruits Canned fruit in a light or heavy syrup. Fried fruit. Fruit in cream or butter sauce. Meat and other protein foods Fatty cuts of meat. Ribs. Fried meat. Berniece Salines. Sausage. Bologna and other processed lunch meats. Salami. Fatback. Hotdogs. Bratwurst. Salted nuts and seeds. Canned beans with added salt. Canned or smoked fish. Whole eggs or egg yolks. Chicken or [place] with skin. Dairy Whole or 2% milk, cream, and half-and-half. Whole or full-fat cream cheese. Whole-fat or sweetened yogurt. Full-fat cheese. Nondairy creamers. Whipped toppings. Processed cheese and cheese spreads. Fats and oils Butter. Stick margarine. Lard. Shortening. Ghee. Bacon fat. Tropical oils, such as coconut, palm kernel, or palm oil. Seasoning and other foods Salted popcorn and pretzels. Onion salt, garlic salt, seasoned salt, table salt, and sea salt. Worcestershire sauce. Tartar sauce. Barbecue sauce. Teriyaki sauce. Soy sauce, including reduced-sodium. Steak sauce. Canned and packaged gravies. Fish sauce. Oyster sauce. Cocktail sauce. Horseradish that you find on the shelf. Ketchup. Mustard. Meat flavorings and tenderizers. Bouillon cubes. Hot sauce and Tabasco sauce. Premade or packaged marinades. Premade or packaged taco seasonings. Relishes. Regular salad dressings. Where to find more information:  National Heart, Lung, and [place]: https://wilson-eaton.com/  American Heart Association: www.heart.org Summary  The DASH eating plan is a healthy eating plan  that has been shown to reduce high blood pressure (hypertension). It may also reduce your risk for type 2 diabetes, heart disease, and stroke.  With the DASH eating plan, you should limit salt (sodium) intake to 2,300 mg a day. If you have hypertension, you may need to reduce your sodium intake to 1,500 mg a day.  When on the DASH eating plan, aim to eat more fresh fruits and vegetables, whole grains, lean proteins, low-fat dairy, and heart-healthy fats.  Work with your health care provider or diet and nutrition specialist (dietitian) to adjust your eating plan to your individual calorie needs. This information is not intended to replace advice given to you by your health care provider. Make sure you discuss any questions you have with your health care provider. Document Released: 02/12/2011 Document Revised: 02/17/2016 Document Reviewed: 02/17/2016 Elsevier  Interactive Patient Education  2017 [employer].   IF you received an x-ray today, you will receive an invoice from [facility]. Please contact [facility] at [phone] with questions or concerns regarding your invoice.   IF you received labwork today, you will receive an invoice from [facility]. Please contact [facility] at [phone] with questions or concerns regarding your invoice.   Our billing staff will not be able to assist you with questions regarding bills from these companies.  You will be contacted with the lab results as soon as they are available. The fastest way to get your results is to activate your My Chart account. Instructions are located on the last page of this paperwork. If you have not heard from [place] regarding the results in 2 weeks, please contact this office.

## 2017-02-12 ENCOUNTER — Encounter: Payer: Self-pay | Admitting: Physician Assistant

## 2017-02-18 ENCOUNTER — Other Ambulatory Visit: Payer: Self-pay

## 2017-02-18 ENCOUNTER — Ambulatory Visit (INDEPENDENT_AMBULATORY_CARE_PROVIDER_SITE_OTHER): Payer: Medicare Other

## 2017-02-18 ENCOUNTER — Encounter: Payer: Self-pay | Admitting: Family Medicine

## 2017-02-18 ENCOUNTER — Ambulatory Visit (INDEPENDENT_AMBULATORY_CARE_PROVIDER_SITE_OTHER): Payer: Medicare Other | Admitting: Family Medicine

## 2017-02-18 VITALS — BP 150/100 | HR 119 | Temp 98.3°F | Resp 16 | Ht 62.5 in | Wt 125.2 lb

## 2017-02-18 DIAGNOSIS — I160 Hypertensive urgency: Secondary | ICD-10-CM | POA: Diagnosis not present

## 2017-02-18 DIAGNOSIS — Z1239 Encounter for other screening for malignant neoplasm of breast: Secondary | ICD-10-CM

## 2017-02-18 DIAGNOSIS — M4850XA Collapsed vertebra, not elsewhere classified, site unspecified, initial encounter for fracture: Secondary | ICD-10-CM

## 2017-02-18 DIAGNOSIS — M549 Dorsalgia, unspecified: Secondary | ICD-10-CM

## 2017-02-18 DIAGNOSIS — Z1231 Encounter for screening mammogram for malignant neoplasm of breast: Secondary | ICD-10-CM

## 2017-02-18 DIAGNOSIS — Z9189 Other specified personal risk factors, not elsewhere classified: Secondary | ICD-10-CM | POA: Diagnosis not present

## 2017-02-18 DIAGNOSIS — Z780 Asymptomatic menopausal state: Secondary | ICD-10-CM

## 2017-02-18 DIAGNOSIS — R740 Nonspecific elevation of levels of transaminase and lactic acid dehydrogenase [LDH]: Secondary | ICD-10-CM | POA: Diagnosis not present

## 2017-02-18 DIAGNOSIS — M4804 Spinal stenosis, thoracic region: Secondary | ICD-10-CM | POA: Diagnosis not present

## 2017-02-18 DIAGNOSIS — Z13820 Encounter for screening for osteoporosis: Secondary | ICD-10-CM | POA: Diagnosis not present

## 2017-02-18 DIAGNOSIS — R7401 Elevation of levels of liver transaminase levels: Secondary | ICD-10-CM

## 2017-02-18 HISTORY — DX: Collapsed vertebra, not elsewhere classified, site unspecified, initial encounter for fracture: M48.50XA

## 2017-02-18 LAB — POCT CBC
Granulocyte percent: 70.5 %G (ref 37–80)
HCT, POC: 43.2 % (ref 37.7–47.9)
Hemoglobin: 14.3 g/dL (ref 12.2–16.2)
Lymph, poc: 1.9 (ref 0.6–3.4)
MCH, POC: 30.1 pg (ref 27–31.2)
MCHC: 33.2 g/dL (ref 31.8–35.4)
MCV: 90.6 fL (ref 80–97)
MID (cbc): 0.3 (ref 0–0.9)
MPV: 7.2 fL (ref 0–99.8)
POC Granulocyte: 5.4 (ref 2–6.9)
POC LYMPH PERCENT: 25.1 %L (ref 10–50)
POC MID %: 4.4 %M (ref 0–12)
Platelet Count, POC: 261 10*3/uL (ref 142–424)
RBC: 4.77 M/uL (ref 4.04–5.48)
RDW, POC: 13.9 %
WBC: 7.7 10*3/uL (ref 4.6–10.2)

## 2017-02-18 LAB — POC MICROSCOPIC URINALYSIS (UMFC): Mucus: ABSENT

## 2017-02-18 LAB — POCT URINALYSIS DIP (MANUAL ENTRY)
Blood, UA: NEGATIVE
Glucose, UA: NEGATIVE mg/dL
Leukocytes, UA: NEGATIVE
Nitrite, UA: NEGATIVE
Protein Ur, POC: NEGATIVE mg/dL
Spec Grav, UA: >=1.03 — AB (ref 1.010–1.025)
Urobilinogen, UA: 0.2 E.U./dL
pH, UA: 5.5 (ref 5.0–8.0)

## 2017-02-18 MED ORDER — CLONIDINE HCL 0.1 MG PO TABS
0.1000 mg | ORAL_TABLET | Freq: Once | ORAL | Status: AC
  Administered 2017-02-18: 0.1 mg via ORAL

## 2017-02-18 MED ORDER — HYDROCODONE-ACETAMINOPHEN 5-325 MG PO TABS: 1.0000 | ORAL_TABLET | Freq: Four times a day (QID) | ORAL | 0 refills | Status: DC | PRN

## 2017-02-18 MED ORDER — TIZANIDINE HCL 4 MG PO TABS: 4.0000 mg | ORAL_TABLET | Freq: Four times a day (QID) | ORAL | 0 refills | Status: DC | PRN

## 2017-02-18 NOTE — Progress Notes (Signed)
Subjective:    Patient ID: Beth [name], female    DOB: 1950/06/24, 66 y.o.   MRN: [id number] Chief Complaint  Patient presents with  . Back Pain    upper mid back for a couple weeks has gotten worse in last few days , pressure under left eye on/off x 2 wks    HPI  Beth [name] is a 66 yo pt who is new to me. She was seen at our office once last mo for an acute cough diagnosed as bronchitis and was complaining with upper back pain at that time which initially felt better after bronchitis treatment but then 2 weeks later began to worsen again so she was advised to add tylenol into her prn advil, keep well hydrated, and try self massage with a tennis ball or foam roller after a heating pad.   She has never had any back issues until she became ill with bronchitis last mo.   Trying to cut down on smoking  HTN: Monitors BP at home - several days ago BP was 139/83.  Has to grip sides of ribs to help with pain   H/o transaminitis  Has been taking 4 regular tylenol a day along with 4 advil a day for the last several weeks.   Past Medical History:  Diagnosis Date  . Kidney stone    Past Surgical History:  Procedure Laterality Date  . EYE SURGERY    . TONSILLECTOMY     Current Outpatient Medications on File Prior to Visit  Medication Sig Dispense Refill  . acetaminophen (TYLENOL) 500 MG tablet Take 500 mg every 6 (six) hours as needed by mouth.    Marland Kitchen azithromycin (ZITHROMAX) 250 MG tablet Take 2 tabs PO x 1 dose, then 1 tab PO QD x 4 days (Patient not taking: Reported on 02/18/2017) 6 tablet 0  . benzonatate (TESSALON) 100 MG capsule Take 1-2 capsules (100-200 mg total) 3 (three) times daily as needed by mouth for cough. (Patient not taking: Reported on 02/18/2017) 40 capsule 0  . predniSONE (DELTASONE) 20 MG tablet Take 3 PO QAM x3days, 2 PO QAM x3days, 1 PO QAM x3days (Patient not taking: Reported on 02/18/2017) 18 tablet 0   No current facility-administered medications on file prior  to visit.    Allergies  Allergen Reactions  . Cefzil [Cefprozil] Other (See Comments)    Burning sensation in lower legs.    History reviewed. No pertinent family history. Social History   Socioeconomic History  . Marital status: Married    Spouse name: None  . Number of children: None  . Years of education: None  . Highest education level: None  Social Needs  . Financial resource strain: None  . Food insecurity - worry: None  . Food insecurity - inability: None  . Transportation needs - medical: None  . Transportation needs - non-medical: None  Occupational History  . None  Tobacco Use  . Smoking status: Former Research scientist (life sciences)  . Smokeless tobacco: Never Used  Substance and Sexual Activity  . Alcohol use: No  . Drug use: No  . Sexual activity: None  Other Topics Concern  . None  Social History Narrative  . None   Depression screen [facility] 2/9 02/18/2017 01/25/2017  Decreased Interest 0 0  Down, Depressed, Hopeless 0 0  PHQ - 2 Score 0 0    Review of Systems See hpi    Objective:   Physical Exam  Constitutional: She is oriented to person, place, and time.  She appears well-developed and well-nourished. No distress.  HENT:  Head: Normocephalic and atraumatic.  Right Ear: External ear normal.  Left Ear: External ear normal.  Eyes: Conjunctivae are normal. No scleral icterus.  Neck: Normal range of motion. Neck supple. No thyromegaly present.  Cardiovascular: Regular rhythm, normal heart sounds and intact distal pulses. Tachycardia present.  Pulmonary/Chest: Effort normal and breath sounds normal. No respiratory distress.  Musculoskeletal: She exhibits no edema.  Lymphadenopathy:    She has no cervical adenopathy.  Neurological: She is alert and oriented to person, place, and time.  Skin: Skin is warm and dry. She is not diaphoretic. No erythema.  Psychiatric: She has a normal mood and affect. Her behavior is normal.      Pulse (!) 119   Temp 98.3 F (36.8 C)    Resp 16   Ht 5' 2.5" (1.588 m)   Wt 125 lb 3.2 oz (56.8 kg)   SpO2 92%   BMI 22.53 kg/m  EKG: Sinus tach, no acute ischemic changes noted. No significant change noted when compared to prior EKG done 02/23/2015.   I have personally reviewed the EKG tracing and agree with the computer interpretation of Sinus tachycardia - short PR syndrome PRi=114. Low voltage with rightward P-axis and rotation - possible pulmonary disease ABNORMAL.  Results for orders placed or performed in visit on 02/18/17  POCT CBC  Result Value Ref Range   WBC 7.7 4.6 - 10.2 K/uL   Lymph, poc 1.9 0.6 - 3.4   POC LYMPH PERCENT 25.1 10 - 50 %L   MID (cbc) 0.3 0 - 0.9   POC MID % 4.4 0 - 12 %M   POC Granulocyte 5.4 2 - 6.9   Granulocyte percent 70.5 37 - 80 %G   RBC 4.77 4.04 - 5.48 M/uL   Hemoglobin 14.3 12.2 - 16.2 g/dL   HCT, POC 43.2 37.7 - 47.9 %   MCV 90.6 80 - 97 fL   MCH, POC 30.1 27 - 31.2 pg   MCHC 33.2 31.8 - 35.4 g/dL   RDW, POC 13.9 %   Platelet Count, POC 261 142 - 424 K/uL   MPV 7.2 0 - 99.8 fL  POCT urinalysis dipstick  Result Value Ref Range   Color, UA yellow yellow   Clarity, UA clear clear   Glucose, UA negative negative mg/dL   Bilirubin, UA small (A) negative   Ketones, POC UA small (15) (A) negative mg/dL   Spec Grav, UA >=1.030 (A) 1.010 - 1.025   Blood, UA negative negative   pH, UA 5.5 5.0 - 8.0   Protein Ur, POC negative negative mg/dL   Urobilinogen, UA 0.2 0.2 or 1.0 E.U./dL   Nitrite, UA Negative Negative   Leukocytes, UA Negative Negative  POCT Microscopic Urinalysis ([facility])  Result Value Ref Range   WBC,UR,HPF,POC Few (A) None WBC/hpf   RBC,UR,HPF,POC None None RBC/hpf   Bacteria Few (A) None, Too numerous to count   Mucus Absent Absent   Epithelial Cells, UR Per Microscopy Few (A) None, Too numerous to count cells/hpf   Crystals Moderate        Assessment & Plan:   1. Hypertensive urgency   2. Mid-back pain, acute   3. Transaminitis   4. Non-traumatic  compression fracture of vertebral column, initial encounter ([place])   5. Screening for breast cancer   6. Screening for osteoporosis   7. At high risk for osteoporosis   8. Postmenopausal estrogen deficiency  Orders Placed This Encounter  Procedures  . DG Thoracic Spine 2 View    Standing Status:   Future    Number of Occurrences:   1    Standing Expiration Date:   02/18/2018    Order Specific Question:   Reason for Exam (SYMPTOM  OR DIAGNOSIS REQUIRED)    Answer:   acute bilateral mid back pain - around T10-T12 - numerous risk factors for osteoporosis/compression frx    Order Specific Question:   Preferred imaging location?    Answer:   [facility]  . DG Bone Density     02/19/17-SPOKE W/ JOHN @ [facility] HE IS GOING TO HAVE THE ORDER CORRECTED AND RESENT-BC    Standing Status:   Future    Standing Expiration Date:   04/21/2018    Order Specific Question:   Reason for Exam (SYMPTOM  OR DIAGNOSIS REQUIRED)    Answer:   3 thoracic compression fracture, screen for osteoporosis, postmenopausal estrogen deficiency    Order Specific Question:   Preferred imaging location?    Answer:   [facility]  . MM Digital Screening    Standing Status:   Future    Standing Expiration Date:   04/21/2018    Order Specific Question:   Reason for Exam (SYMPTOM  OR DIAGNOSIS REQUIRED)    Answer:   screening for breast cancer    Order Specific Question:   Preferred imaging location?    Answer:   [facility]  . Comprehensive metabolic panel  . Ambulatory referral to Orthopedic Surgery    Referral Priority:   Medium    Referral Type:   Surgical    Referral Reason:   Specialty Services Required    Requested Specialty:   Orthopedic Surgery    Number of Visits Requested:   1  . POCT CBC  . POCT urinalysis dipstick  . POCT Microscopic Urinalysis ([facility])  . EKG 12-Lead    Meds ordered this encounter  Medications  . cloNIDine (CATAPRES) tablet 0.1 mg  . HYDROcodone-acetaminophen (NORCO/VICODIN) 5-325  MG tablet    Sig: Take 1 tablet by mouth every 6 (six) hours as needed for moderate pain.    Dispense:  20 tablet    Refill:  0  . tiZANidine (ZANAFLEX) 4 MG tablet    Sig: Take 1 tablet (4 mg total) by mouth every 6 (six) hours as needed for muscle spasms.    Dispense:  30 tablet    Refill:  0     Delman Cheadle, M.D.  Primary Care at [facility] [address] [place], [place] [address] [phone] phone [phone] fax  02/21/17 10:27 AM

## 2017-02-18 NOTE — Patient Instructions (Addendum)
You need to schedule your mammogram and your DEXA bone scan.  Please call [facility] at [phone] to schedule.    IF you received an x-ray today, you will receive an invoice from [facility]. Please contact [facility] at [phone] with questions or concerns regarding your invoice.   IF you received labwork today, you will receive an invoice from [facility]. Please contact [facility] at [phone] with questions or concerns regarding your invoice.   Our billing staff will not be able to assist you with questions regarding bills from these companies.  You will be contacted with the lab results as soon as they are available. The fastest way to get your results is to activate your My Chart account. Instructions are located on the last page of this paperwork. If you have not heard from [place] regarding the results in 2 weeks, please contact this office.       Spinal Compression Fracture A spinal compression fracture is a collapse of the bones that form the spine (vertebrae). With this type of fracture, the vertebrae become squashed (compressed) into a wedge shape. Most compression fractures happen in the middle or lower part of the spine. What are the causes? This condition may be caused by:  Thinning and loss of density in the bones (osteoporosis). This is the most common cause.  A fall.  A car or motorcycle accident.  Cancer.  Trauma, such as a heavy, direct hit to the head.  What increases the risk? You may be at greater risk for a spinal compression fracture if you:  Are 31 years old or older.  Have osteoporosis.  Have certain types of cancer, including: ? Multiple myeloma. ? Lymphoma. ? Prostate cancer. ? Lung cancer. ? Breast cancer.  What are the signs or symptoms? Symptoms of this condition include:  Severe pain.  Pain that gets worse over time.  Pain that is worse when you stand, walk, sit, or bend.  Sudden pain  that is so bad that it is hard for you to move.  Bending or humping of the spine.  Gradual loss of height.  Numbness, tingling, or weakness in the back and legs.  Trouble walking.  Your symptoms will depend on the cause of the fracture and how quickly it develops. For example, fractures that are caused by osteoporosis can cause few symptoms, no symptoms, or symptoms that develop slowly over time. How is this diagnosed? This condition may be diagnosed based on symptoms, medical history, and a physical exam. During the physical exam, your health care provider may tap along the length of your spine to check for tenderness. Tests may be done to confirm the diagnosis. They may include:  A bone density test to check for osteoporosis.  Imaging tests, such as a spine X-ray, a CT scan, or MRI.  How is this treated? Treatment for this condition depends on the cause and severity of the condition.Some fractures, such as those that are caused by osteoporosis, may heal on their own with supportive care. This may include:  Pain medicine.  Rest.  A back brace.  Physical therapy exercises.  Medicine that reduces bone pain.  Calcium and vitamin D supplements.  Fractures that cause the back to become misshapen, cause nerve pain or weakness, or do not respond to other treatment may be treated with a surgical procedure, such as:  Vertebroplasty. In this procedure, bone cement is injected into the collapsed vertebrae to stabilize them.  Balloon kyphoplasty. In this procedure,  the collapsed vertebrae are expanded with a balloon and then bone cement is injected into them.  Spinal fusion. In this procedure, the collapsed vertebrae are connected (fused) to normal vertebrae.  Follow these instructions at home: General instructions  Take medicines only as directed by your health care provider.  Do not drive or operate heavy machinery while taking pain medicine.  If directed, apply ice to the  injured area: ? Put ice in a plastic bag. ? Place a towel between your skin and the bag. ? Leave the ice on for 30 minutes every two hours at first. Then apply the ice as needed.  Wear your neck brace or back brace as directed by your health care provider.  Do not drink alcohol. Alcohol can interfere with your treatment.  Keep all follow-up visits as directed by your health care provider. This is important. It can help to prevent permanent injury, disability, and long-lasting (chronic) pain. Activity  Stay in bed (on bed rest) only as directed by your health care provider. Being on bed rest for too long can make your condition worse.  Return to your normal activities as directed by your health care provider. Ask what activities are safe for you.  Do exercises to improve motion and strength in your back (physical therapy), as recommended by your health care provider.  Exercise regularly as directed by your health care provider. Contact a health care provider if:  You have a fever.  You develop a cough that makes your pain worse.  Your pain medicine is not helping.  Your pain does not get better over time.  You cannot return to your normal activities as planned or expected. Get help right away if:  Your pain is very bad and it suddenly gets worse.  You are unable to move any body part (paralysis) that is below the level of your injury.  You have numbness, tingling, or weakness in any body part that is below the level of your injury.  You cannot control your bladder or bowels. This information is not intended to replace advice given to you by your health care provider. Make sure you discuss any questions you have with your health care provider. Document Released: 02/23/2005 Document Revised: 10/22/2015 Document Reviewed: 02/27/2014 Elsevier Interactive Patient Education  [employer].

## 2017-02-19 ENCOUNTER — Telehealth: Payer: Self-pay | Admitting: Family Medicine

## 2017-02-19 ENCOUNTER — Telehealth: Payer: Self-pay

## 2017-02-19 ENCOUNTER — Ambulatory Visit: Payer: Self-pay | Admitting: *Deleted

## 2017-02-19 LAB — COMPREHENSIVE METABOLIC PANEL
ALT: 19 IU/L (ref 0–32)
AST: 30 IU/L (ref 0–40)
Albumin/Globulin Ratio: 1.8 (ref 1.2–2.2)
Albumin: 4.7 g/dL (ref 3.6–4.8)
Alkaline Phosphatase: 101 IU/L (ref 39–117)
BUN/Creatinine Ratio: 28 (ref 12–28)
BUN: 18 mg/dL (ref 8–27)
Bilirubin Total: <0.2 mg/dL (ref 0.0–1.2)
CO2: 25 mmol/L (ref 20–29)
Calcium: 9.9 mg/dL (ref 8.7–10.3)
Chloride: 99 mmol/L (ref 96–106)
Creatinine, Ser: 0.64 mg/dL (ref 0.57–1.00)
GFR calc Af Amer: 106 mL/min/{1.73_m2} (ref >59)
GFR calc non Af Amer: 92 mL/min/{1.73_m2} (ref >59)
Globulin, Total: 2.6 g/dL (ref 1.5–4.5)
Glucose: 121 mg/dL — ABNORMAL HIGH (ref 65–99)
Potassium: 4.3 mmol/L (ref 3.5–5.2)
Sodium: 140 mmol/L (ref 134–144)
Total Protein: 7.3 g/dL (ref 6.0–8.5)

## 2017-02-19 MED ORDER — ONDANSETRON HCL 4 MG PO TABS: 4.0000 mg | ORAL_TABLET | Freq: Three times a day (TID) | ORAL | 0 refills | Status: DC | PRN

## 2017-02-19 MED ORDER — METHOCARBAMOL 500 MG PO TABS: 500.0000 mg | ORAL_TABLET | Freq: Three times a day (TID) | ORAL | 0 refills | Status: DC

## 2017-02-19 NOTE — Telephone Encounter (Signed)
Message sent to Dr. Brigitte Pulse.  Advised in person.

## 2017-02-19 NOTE — Addendum Note (Signed)
Addended by: Mercie Eon, Virjean Boman B on: 02/19/2017 12:40 PM   Modules accepted: Orders

## 2017-02-19 NOTE — Telephone Encounter (Signed)
Pt's husband states that the patient is vomiting from the Zanaflex. Every time she takes it she gets nauseated and vomits. Wanted to know if something else can be called in for her. Awaiting a call back. Pt was also triaged.

## 2017-02-19 NOTE — Telephone Encounter (Signed)
Received call from the breast center. They are unable to use osteoperosis screening or post menopausal as dx codes for the bone density exam. They said that they cannot use these because medicare will not pay for these tests with those dx codes.

## 2017-02-19 NOTE — Telephone Encounter (Signed)
  Reason for Disposition . Vomiting a prescription medication  Answer Assessment - Initial Assessment Questions 1. NAUSEA SEVERITY: "How bad is the nausea?" (e.g., mild, moderate, severe; dehydration, weight loss)   - MILD: loss of appetite without change in eating habits   - MODERATE: decreased oral intake without significant weight loss, dehydration, or malnutrition   - SEVERE: inadequate caloric or fluid intake, significant weight loss, symptoms of dehydration     severe 2. ONSET: "When did the nausea begin?"     Last night 3. VOMITING: "Any vomiting?" If so, ask: "How many times today?"     yes 4. RECURRENT SYMPTOM: "Have you had nausea before?" If so, ask: "When was the last time?" "What happened that time?"     n/a 5. CAUSE: "What do you think is causing the nausea?"      medication 6. PREGNANCY: "Is there any chance you are pregnant?" (e.g., unprotected intercourse, missed birth control pill, broken condom)     no  Protocols used: VOMITING-A-AH, NAUSEA-A-AH

## 2017-02-19 NOTE — Telephone Encounter (Signed)
So what will they pay for? If I knew, I would have used it. . .   Please call and ask if they did not tell youl

## 2017-02-19 NOTE — Telephone Encounter (Signed)
Pt's husband states that every time she takes the muscle relaxer she gets nauseated and vomits. Requesting a new muscle relaxer called in for patient.  Home care advice given to husband for patient.

## 2017-02-19 NOTE — Telephone Encounter (Signed)
Copied from [place]. Topic: Inquiry >> Feb 19, 2017  2:38 PM Neva Seat wrote: Dr Brigitte Pulse ordered Tizanidine yesterday was picked up - 30 pills Dr. Linna Darner ordered Methocarbamol for pt today.  Pharmacist is asking if pt needs to stop the Tizanidine? Isac Caddy [phone]

## 2017-02-19 NOTE — Telephone Encounter (Signed)
Can just stop the zanaflex. By no means is that a necessary medicine. Pt has prn hydrocodone and has an appt sched w/ me for f/u on Mon 12/17 morning which can be changed to tomorrow Sat 12/15 if pain is uncontrolled.

## 2017-02-19 NOTE — Telephone Encounter (Addendum)
Correction, Dr. Brigitte Pulse was not in office.  Checked with Dr. Linna Darner. He rx change to Robaxin 500mg  #30, 1 tab TID and Zofran 4mg  #10, 1q/6-8 hrs.    Called husband, discussed pt needs food on her stomach to be able to take Robaxin and Vicodin.  Discussed meds above Dr. Linna Darner prescribed.  Stressed good positions for sleep, with knees flexed, pillow under knees to keep muscles relaxed. Advised Zofran may make pt sleepy.  Advised to take Tylenol ES - 2 every 8 hours if pain is not severe.  Advised to give pt Gatoraid and force fluids if possible to avoid dehydration.  Advised to use stool softener and stimulant when using Vicodin to ensure pt does not become constipated.   Pts husband verbalized understanding.

## 2017-02-20 NOTE — Telephone Encounter (Signed)
Phone call to [facility] in [place]. Spoke with pharmacy staff, medication has already been clarified. Closing note.

## 2017-02-20 NOTE — Telephone Encounter (Signed)
Duplicate encounter, closing note.

## 2017-02-22 ENCOUNTER — Other Ambulatory Visit: Payer: Self-pay

## 2017-02-22 ENCOUNTER — Ambulatory Visit (INDEPENDENT_AMBULATORY_CARE_PROVIDER_SITE_OTHER): Payer: Medicare Other | Admitting: Family Medicine

## 2017-02-22 ENCOUNTER — Encounter: Payer: Self-pay | Admitting: Family Medicine

## 2017-02-22 ENCOUNTER — Telehealth: Payer: Self-pay | Admitting: Family Medicine

## 2017-02-22 VITALS — BP 148/94 | HR 99 | Temp 98.3°F | Resp 16 | Ht 62.5 in | Wt 123.8 lb

## 2017-02-22 DIAGNOSIS — I160 Hypertensive urgency: Secondary | ICD-10-CM | POA: Diagnosis not present

## 2017-02-22 DIAGNOSIS — H61891 Other specified disorders of right external ear: Secondary | ICD-10-CM

## 2017-02-22 DIAGNOSIS — M4850XD Collapsed vertebra, not elsewhere classified, site unspecified, subsequent encounter for fracture with routine healing: Secondary | ICD-10-CM | POA: Diagnosis not present

## 2017-02-22 DIAGNOSIS — H9201 Otalgia, right ear: Secondary | ICD-10-CM

## 2017-02-22 MED ORDER — LIDOCAINE-PRILOCAINE 2.5-2.5 % EX CREA: 1.0000 "application " | TOPICAL_CREAM | CUTANEOUS | 0 refills | Status: DC | PRN

## 2017-02-22 MED ORDER — MELOXICAM 15 MG PO TABS: 15.0000 mg | ORAL_TABLET | Freq: Every day | ORAL | 0 refills | Status: DC

## 2017-02-22 MED ORDER — LISINOPRIL-HYDROCHLOROTHIAZIDE 10-12.5 MG PO TABS: 1.0000 | ORAL_TABLET | Freq: Every day | ORAL | 1 refills | Status: DC

## 2017-02-22 MED ORDER — ALENDRONATE SODIUM 70 MG PO TABS: 70.0000 mg | ORAL_TABLET | ORAL | 0 refills | Status: DC

## 2017-02-22 MED ORDER — MUPIROCIN 2 % EX OINT: 1.0000 "application " | TOPICAL_OINTMENT | Freq: Four times a day (QID) | CUTANEOUS | 0 refills | Status: DC

## 2017-02-22 NOTE — Progress Notes (Signed)
Subjective:    Patient ID: Beth [name], female    DOB: 06-18-1950, 66 y.o.   MRN: [id number] Chief Complaint  Patient presents with  . Follow-up    hypertensive urgency     HPI  Beth [name] is a delightful 66 yo woman who has been out of medical care for several years.   She was c/o severe back pain and cough, and was found to have 3 thoracic compression frxs - one which had progressed since her last imaging several weeks prior so referred to ortho spine surgery since she was still in such pain. She reports her back pain is better today - still there but not cringing in pain.  She has used a few vicodin which has helped. Unable to tolerate the zanaflex due to n/v. However, her back is finally started to feel better!!  HTN: Monitors BP at home - several days ago BP was 139/83. BP uncontrolled at last OV 4d prior but pt attributed to white coat HTN and pain. Given clonidone 0.1mg  in office with minimal response.     Her ear has had a sore on it for a very long time - years. She has tried neosporin which helps sometimes.  Last night she woke up 3x with her ear being in pain which is much worse than prior.    Past Medical History:  Diagnosis Date  . Kidney stone    Past Surgical History:  Procedure Laterality Date  . EYE SURGERY    . TONSILLECTOMY     Current Outpatient Medications on File Prior to Visit  Medication Sig Dispense Refill  . acetaminophen (TYLENOL) 500 MG tablet Take 500 mg every 6 (six) hours as needed by mouth.    Marland Kitchen HYDROcodone-acetaminophen (NORCO/VICODIN) 5-325 MG tablet Take 1 tablet by mouth every 6 (six) hours as needed for moderate pain. 20 tablet 0  . methocarbamol (ROBAXIN) 500 MG tablet Take 1 tablet (500 mg total) by mouth 3 (three) times daily. 30 tablet 0   No current facility-administered medications on file prior to visit.    Allergies  Allergen Reactions  . Cefzil [Cefprozil] Other (See Comments)    Burning sensation in lower legs.   [name]  [Tizanidine Hcl] Nausea And Vomiting   History reviewed. No pertinent family history. Social History   Socioeconomic History  . Marital status: Married    Spouse name: None  . Number of children: None  . Years of education: None  . Highest education level: None  Social Needs  . Financial resource strain: None  . Food insecurity - worry: None  . Food insecurity - inability: None  . Transportation needs - medical: None  . Transportation needs - non-medical: None  Occupational History  . None  Tobacco Use  . Smoking status: Former Research scientist (life sciences)  . Smokeless tobacco: Never Used  Substance and Sexual Activity  . Alcohol use: No  . Drug use: No  . Sexual activity: None  Other Topics Concern  . None  Social History Narrative  . None   Depression screen [facility] 2/9 02/22/2017 02/18/2017 01/25/2017  Decreased Interest 0 0 0  Down, Depressed, Hopeless 0 0 0  PHQ - 2 Score 0 0 0    Review of Systems See hpi    Objective:   Physical Exam  Constitutional: She is oriented to person, place, and time. She appears well-developed and well-nourished. No distress.  HENT:  Head: Normocephalic and atraumatic.  Right Ear: External ear normal.  Left Ear:  External ear normal.  Intermittently painful subcu nodule on pinna  Eyes: Conjunctivae are normal. No scleral icterus.  Neck: Normal range of motion. Neck supple. No thyromegaly present.  Cardiovascular: Normal rate, regular rhythm, normal heart sounds and intact distal pulses.  Pulmonary/Chest: Effort normal and breath sounds normal. No respiratory distress.  Musculoskeletal: She exhibits no edema.  Lymphadenopathy:    She has no cervical adenopathy.  Neurological: She is alert and oriented to person, place, and time.  Skin: Skin is warm and dry. She is not diaphoretic. No erythema.  Psychiatric: She has a normal mood and affect. Her behavior is normal.      BP (!) 178/90   Pulse 99   Temp 98.3 F (36.8 C)   Resp 16   Ht 5' 2.5"  (1.588 m)   Wt 123 lb 12.8 oz (56.2 kg)   SpO2 93%   BMI 22.28 kg/m     Assessment & Plan:   1. Nodule of external ear, right - appears benign subcu nodule - cyst vs granuloma but as slowly growing overtime which is causing increasing pain will refer to ENT for further eval.   2. Non-traumatic compression fracture of vertebra with routine healing, subsequent encounter - start qam meloxicam - watch closely to ensure doesn't elevated BP. Sxs are improving. Has ortho spine referral P. Needs to sched baseline DEXA but due to current frx will def need to at last start bisphosphonate and ca/vit D supp so start on fosamax.  3. Hypertensive urgency - severe white coat HTN but still mildly elev outside of office - start lisinopril-hctz 10-12.5  4. Pain of right ear structure - try topical numbing qhs while ENT appt P since wakes her from sleep o/n when she lays on it    Orders Placed This Encounter  Procedures  . Ambulatory referral to ENT    Referral Priority:   Routine    Referral Type:   Consultation    Referral Reason:   Specialty Services Required    Requested Specialty:   Otolaryngology    Number of Visits Requested:   1    Meds ordered this encounter  Medications  . lidocaine-prilocaine (EMLA) cream    Sig: Apply 1 application topically as needed. To right ear lesion    Dispense:  30 g    Refill:  0  . lisinopril-hydrochlorothiazide (PRINZIDE,ZESTORETIC) 10-12.5 MG tablet    Sig: Take 1 tablet by mouth daily.    Dispense:  30 tablet    Refill:  1  . meloxicam (MOBIC) 15 MG tablet    Sig: Take 1 tablet (15 mg total) by mouth daily.    Dispense:  30 tablet    Refill:  0  . mupirocin ointment (BACTROBAN) 2 %    Sig: Apply 1 application topically 4 (four) times daily.    Dispense:  30 g    Refill:  0  . alendronate (FOSAMAX) 70 MG tablet    Sig: Take 1 tablet (70 mg total) by mouth once a week. Take with a full glass of water on an empty stomach.    Dispense:  12 tablet     Refill:  0    Delman Cheadle, M.D.  Primary Care at [facility] [address] [place], [place] [address] [phone] phone [phone] fax  02/28/17 9:59 AM

## 2017-02-22 NOTE — Patient Instructions (Addendum)
Start extra-strength acetaminophen (500mg ) 2 tabs 2-3 times a day and take the meloxicam every morning for your back pain. Do not use with any other otc pain medication other than tylenol/acetaminophen - so no aleve, ibuprofen, motrin, advil, etc.  You can using the methocarbamol and/or hydrocodone during the day as needed for additional pain control.     Start the blood pressure medicine lisinopril-hctz every morning.  Wet warm compresses to right ear several times throughout the day followed by mupirocin.   Apply the lidocaine cream 1-2 hours before bed and reapply as needed.  Start the fosamax to help keep your bones strong.Take this once a week, first thing in the morning on an empty stomach with water only. For the next 30 minutes you must remain upright (do not lay back down as we want to use gravity to ensure that the pill remains in the stomach). It is very difficult for your body to absorb this medicine so wait at least 30 minutes before eating, drinking, or taking any other medications/vitamins/supplements. It is recommended to have any needed dental work completed prior to starting this medication. We will get a DEXA bone scan asap to get a baseline osteoporosis score that we can recheck every 2 years to see if we have you on the right treatment.   While your body can take calcium out of your bones, it can no longer put calcium back into your bones which is why it is so important to make sure you that you are getting an adequate oral intake of 1200 mg of calcium on a daily basis through your diet and/or supplements. Calcium cannot be absorbed in large doses so do not take >500mg  at a time. You cannot absorb calcium well without adequate vitamin D so make sure to get at least 800 iu of vitamin D daily. Weight-bearing exercise for at least 30 minutes 3 days a week can also help.  The main dietary sources of calcium include milk and other dairy products, such as cottage cheese, yogurt, or  hard cheese, and green vegetables, such as kale and broccoli. A rough method of estimating dietary calcium intake is to multiply the number of dairy servings consumed each day by 300 mg. One serving is 8 oz of milk (236 mL) or yogurt (224 g), 1 oz (28 g) of hard cheese, or 16 oz (448 g) of cottage cheese.   Recheck in 2 weeks for BP. DO take you medicine that morning.  You need to schedule your mammogram and your DEXA bone scan.  Please call [place] at [phone] to schedule.   IF you received an x-ray today, you will receive an invoice from [facility]. Please contact [facility] at [phone] with questions or concerns regarding your invoice.   IF you received labwork today, you will receive an invoice from [place]. Please contact [facility] at [phone] with questions or concerns regarding your invoice.   Our billing staff will not be able to assist you with questions regarding bills from these companies.  You will be contacted with the lab results as soon as they are available. The fastest way to get your results is to activate your My Chart account. Instructions are located on the last page of this paperwork. If you have not heard from [place] regarding the results in 2 weeks, please contact this office.     Calcium Intake Recommendations Calcium is a mineral that affects many functions in the body, including:  Blood clotting.  Blood vessel function.  Nerve impulse conduction.  Hormone secretion.  Muscle contraction.  Bone and teeth functions.  Most of your body's calcium supply is stored in your bones and teeth. When your calcium stores are low, you may be at risk for low bone mass, bone loss, and bone fractures. Consuming enough calcium helps to grow healthy bones and teeth and to prevent breakdown over time. It is very important that you get enough calcium if you are:  A child undergoing rapid growth.  An adolescent  girl.  A pre- or post-menopausal woman.  A woman whose menstrual cycle has stopped due to anorexia nervosa or regular intense exercise.  An individual with lactose intolerance or a milk allergy.  A vegetarian.  What is my plan? Try to consume the recommended amount of calcium daily based on your age. Depending on your overall health, your health care provider may recommend increased calcium intake.General daily calcium intake recommendations by age are:  Birth to 6 months: 200 mg.  Infants 7 to 12 months: 260 mg.  Children 1 to 3 years: 700 mg.  Children 4 to 8 years: 1,000 mg.  Children 9 to 13 years: 1,300 mg.  Teens 14 to 18 years: 1,300 mg.  Adults 19 to 50 years: 1,000 mg.  Adult women 51 to 70 years: 1,200 mg.  Adult men 51 to 70 years: 1,000 mg.  Adults 71 years and older: 1,200 mg.  Pregnant and breastfeeding teens: 1,300 mg.  Pregnant and breastfeeding adults: 1,000 mg.  What do I need to know about calcium intake?  In order for the body to absorb calcium, it needs vitamin D. You can get vitamin D through: ? Direct exposure of the skin to sunlight. ? Foods, such as egg yolks, liver, saltwater fish, and fortified milk. ? Supplements.  Consuming too much calcium may cause: ? Constipation. ? Decreased absorption of iron and zinc. ? Kidney stones.  Calcium supplements may interact with certain medicines. Check with your health care provider before starting any calcium supplements.  Try to get most of your calcium from food. What foods can I eat? Grains  Fortified oatmeal. Fortified ready-to-eat cereals. Fortified frozen waffles. Vegetables Turnip greens. Broccoli. Fruits Fortified orange juice. Meats and Other Protein Sources Canned sardines with bones. Canned salmon with bones. Soy beans. Tofu. Baked beans. Almonds. [place] nuts. Sunflower seeds. Dairy Milk. Yogurt. Cheese. Cottage cheese. Beverages Fortified soy milk. Fortified rice  milk. Sweets/Desserts Pudding. Ice Cream. Milkshakes. Blackstrap molasses. The items listed above may not be a complete list of recommended foods or beverages. Contact your dietitian for more options. What foods can affect my calcium intake? It may be more difficult for your body to use calcium or calcium may leave your body more quickly if you consume large amounts of:  Sodium.  Protein.  Caffeine.  Alcohol.  This information is not intended to replace advice given to you by your health care provider. Make sure you discuss any questions you have with your health care provider. Document Released: 10/08/2003 Document Revised: 09/13/2015 Document Reviewed: 08/01/2013 Elsevier Interactive Patient Education  2018 [place] protect organs, store calcium, and anchor muscles. Good health habits, such as eating nutritious foods and exercising regularly, are important for maintaining healthy bones. They can also help to prevent a condition that causes bones to lose density and become weak and brittle (osteoporosis). Why is bone mass important? Bone mass refers to the amount of bone tissue that you have. The  higher your bone mass, the stronger your bones. An important step toward having healthy bones throughout life is to have strong and dense bones during childhood. A young adult who has a high bone mass is more likely to have a high bone mass later in life. Bone mass at its greatest it is called peak bone mass. A large decline in bone mass occurs in older adults. In women, it occurs about the time of menopause. During this time, it is important to practice good health habits, because if more bone is lost than what is replaced, the bones will become less healthy and more likely to break (fracture). If you find that you have a low bone mass, you may be able to prevent osteoporosis or further bone loss by changing your diet and lifestyle. How can I find out if my bone mass is  low? Bone mass can be measured with an X-ray test that is called a bone mineral density (BMD) test. This test is recommended for all women who are age 64 or older. It may also be recommended for men who are age 26 or older, or for people who are more likely to develop osteoporosis due to:  Having bones that break easily.  Having a long-term disease that weakens bones, such as kidney disease or rheumatoid arthritis.  Having menopause earlier than normal.  Taking medicine that weakens bones, such as steroids, thyroid hormones, or hormone treatment for breast cancer or prostate cancer.  Smoking.  Drinking three or more alcoholic drinks each day.  What are the nutritional recommendations for healthy bones? To have healthy bones, you need to get enough of the right minerals and vitamins. Most nutrition experts recommend getting these nutrients from the foods that you eat. Nutritional recommendations vary from person to person. Ask your health care provider what is healthy for you. Here are some general guidelines. Calcium Recommendations Calcium is the most important (essential) mineral for bone health. Most people can get enough calcium from their diet, but supplements may be recommended for people who are at risk for osteoporosis. Good sources of calcium include:  Dairy products, such as low-fat or nonfat milk, cheese, and yogurt.  Dark green leafy vegetables, such as bok choy and broccoli.  Calcium-fortified foods, such as orange juice, cereal, bread, soy beverages, and tofu products.  Nuts, such as almonds.  Follow these recommended amounts for daily calcium intake:  Children, age 57?3: 700 mg.  Children, age 2?8: 1,000 mg.  Children, age 22?13: 1,300 mg.  Teens, age 68?18: 1,300 mg.  Adults, age 81?50: 1,000 mg.  Adults, age 24?70: ? Men: 1,000 mg. ? Women: 1,200 mg.  Adults, age 23 or older: 1,200 mg.  Pregnant and breastfeeding females: ? Teens: 1,300 mg. ? Adults:  1,000 mg.  Vitamin D Recommendations Vitamin D is the most essential vitamin for bone health. It helps the body to absorb calcium. Sunlight stimulates the skin to make vitamin D, so be sure to get enough sunlight. If you live in a cold climate or you do not get outside often, your health care provider may recommend that you take vitamin D supplements. Good sources of vitamin D in your diet include:  Egg yolks.  Saltwater fish.  Milk and cereal fortified with vitamin D.  Follow these recommended amounts for daily vitamin D intake:  Children and teens, age 67?18: 9 international units.  Adults, age 28 or younger: 400-800 international units.  Adults, age 33 or older: 800-1,000 international units.  Other  Nutrients Other nutrients for bone health include:  Phosphorus. This mineral is found in meat, poultry, dairy foods, nuts, and legumes. The recommended daily intake for adult men and adult women is 700 mg.  Magnesium. This mineral is found in seeds, nuts, dark green vegetables, and legumes. The recommended daily intake for adult men is 400?420 mg. For adult women, it is 310?320 mg.  Vitamin K. This vitamin is found in green leafy vegetables. The recommended daily intake is 120 mg for adult men and 90 mg for adult women.  What type of physical activity is best for building and maintaining healthy bones? Weight-bearing and strength-building activities are important for building and maintaining peak bone mass. Weight-bearing activities cause muscles and bones to work against gravity. Strength-building activities increases muscle strength that supports bones. Weight-bearing and muscle-building activities include:  Walking and hiking.  Jogging and running.  Dancing.  Gym exercises.  Lifting weights.  Tennis and racquetball.  Climbing stairs.  Aerobics.  Adults should get at least 30 minutes of moderate physical activity on most days. Children should get at least 60 minutes of  moderate physical activity on most days. Ask your health care provide what type of exercise is best for you. Where can I find more information? For more information, check out the following websites:  [place]: [employer].se  [employer] of Health: http://www.niams.AnonymousEar.fr.asp  This information is not intended to replace advice given to you by your health care provider. Make sure you discuss any questions you have with your health care provider. Document Released: 05/16/2003 Document Revised: 09/13/2015 Document Reviewed: 02/28/2014 Elsevier Interactive Patient Education  [employer].

## 2017-02-22 NOTE — Telephone Encounter (Signed)
Copied from [place] [id number]. Topic: Quick Communication - See Telephone Encounter >> Feb 22, 2017  4:04 PM Ether Griffins B wrote: CRM for notification. See Telephone encounter for:  Pt calling in pharmacy hasnt received the fosamax. Advised pt to contact pharmacy again. 02/22/17.

## 2017-02-23 ENCOUNTER — Ambulatory Visit: Payer: Medicare Other | Admitting: Physician Assistant

## 2017-02-24 ENCOUNTER — Telehealth: Payer: Self-pay | Admitting: Family Medicine

## 2017-02-24 DIAGNOSIS — M4850XA Collapsed vertebra, not elsewhere classified, site unspecified, initial encounter for fracture: Secondary | ICD-10-CM

## 2017-02-24 DIAGNOSIS — E2839 Other primary ovarian failure: Secondary | ICD-10-CM

## 2017-02-24 NOTE — Telephone Encounter (Signed)
Incoming call from [place] at [facility]  She states she needs different diagnosis for bone density exam to be covered. Beth [name] states screening for osteoporosis and risk for osteoporosis can't be used as diagnosis for first exam. Postmenopausal also can't be accepted by medicare as diagnosis.   Estrogen deficiency, compression fracture diagnosis would work for [employer] to cover exam.   New order pended for provider signature. Provider, please sign.   Beth [name] states she will call patient to schedule once new order is in.

## 2017-02-25 NOTE — Telephone Encounter (Signed)
Perfect!  Signed.  [place] YOU SO SO SO SO MUCH for finding out what does cover, entering new order and new diagnosis and linking it, Lizzie!!!! You ARE WONDERFUL!!!!!!!!!

## 2017-03-03 ENCOUNTER — Telehealth: Payer: Self-pay | Admitting: Family Medicine

## 2017-03-03 NOTE — Telephone Encounter (Signed)
Copied from [place] [id number]. Topic: Quick Communication - See Telephone Encounter >> Mar 03, 2017 11:31 AM Aurelio Brash B wrote: CRM for notification. See Telephone encounter for:  Pt was prescribed meloxican  but has not taken any after reading the possible side effects,  pt is requesting  a different med   [facility]  03/03/17.

## 2017-03-05 NOTE — Telephone Encounter (Signed)
Message forwarded to Dr. Brigitte Pulse - Pt has appt 12/31

## 2017-03-07 NOTE — Telephone Encounter (Signed)
Meloxicam has some of the least side effects in that class so no nsaid that I can switch her to that is necessarily any better - if she doesn't want to use that, it is perfectly find to use otc naproxen 2 tabs twice a day with food instead - similar side effect profile and has to watch out for irritation of the stomach lining/GI bleed so stop if any abdominal pain or black/tarry stools.

## 2017-03-08 ENCOUNTER — Ambulatory Visit (INDEPENDENT_AMBULATORY_CARE_PROVIDER_SITE_OTHER): Payer: Medicare Other | Admitting: Family Medicine

## 2017-03-08 ENCOUNTER — Encounter: Payer: Self-pay | Admitting: Family Medicine

## 2017-03-08 ENCOUNTER — Other Ambulatory Visit: Payer: Self-pay

## 2017-03-08 VITALS — BP 136/84 | HR 116 | Temp 98.1°F | Resp 18 | Ht 62.5 in | Wt 121.6 lb

## 2017-03-08 DIAGNOSIS — M4850XS Collapsed vertebra, not elsewhere classified, site unspecified, sequela of fracture: Secondary | ICD-10-CM | POA: Diagnosis not present

## 2017-03-08 DIAGNOSIS — M546 Pain in thoracic spine: Secondary | ICD-10-CM | POA: Diagnosis not present

## 2017-03-08 DIAGNOSIS — I10 Essential (primary) hypertension: Secondary | ICD-10-CM | POA: Diagnosis not present

## 2017-03-08 DIAGNOSIS — M8000XD Age-related osteoporosis with current pathological fracture, unspecified site, subsequent encounter for fracture with routine healing: Secondary | ICD-10-CM | POA: Diagnosis not present

## 2017-03-08 DIAGNOSIS — H61891 Other specified disorders of right external ear: Secondary | ICD-10-CM | POA: Diagnosis not present

## 2017-03-08 DIAGNOSIS — Z5181 Encounter for therapeutic drug level monitoring: Secondary | ICD-10-CM | POA: Diagnosis not present

## 2017-03-08 DIAGNOSIS — Z23 Encounter for immunization: Secondary | ICD-10-CM

## 2017-03-08 DIAGNOSIS — J449 Chronic obstructive pulmonary disease, unspecified: Secondary | ICD-10-CM

## 2017-03-08 DIAGNOSIS — L858 Other specified epidermal thickening: Secondary | ICD-10-CM

## 2017-03-08 MED ORDER — TRAMADOL HCL 50 MG PO TABS: 50.0000 mg | ORAL_TABLET | Freq: Three times a day (TID) | ORAL | 1 refills | Status: DC | PRN

## 2017-03-08 MED ORDER — ALBUTEROL SULFATE HFA 108 (90 BASE) MCG/ACT IN AERS: 2.0000 | INHALATION_SPRAY | RESPIRATORY_TRACT | 1 refills | Status: DC | PRN

## 2017-03-08 MED ORDER — HYDROCODONE-ACETAMINOPHEN 5-325 MG PO TABS: 1.0000 | ORAL_TABLET | Freq: Every day | ORAL | 0 refills | Status: DC

## 2017-03-08 MED ORDER — ALBUTEROL SULFATE (2.5 MG/3ML) 0.083% IN NEBU
2.5000 mg | INHALATION_SOLUTION | Freq: Once | RESPIRATORY_TRACT | Status: AC
  Administered 2017-03-08: 2.5 mg via RESPIRATORY_TRACT

## 2017-03-08 NOTE — Patient Instructions (Addendum)
Please call [facility] to schedule a consult appointment.   I have also referred you to [facility] for physical therapy for the osteoporosis and compression fractures.  Call ENT Dr. Tamsen Roers Teoh to schedule appointment for evaluation of removal of the ear nodule.   Continue on the acetaminophen 650mg  to 1000mg  two to three times a day and 1/2 tab of the meloxicam every morning or every other day.  If you develop any abdominal pain, chest pain, black/tarry stools, or notice INCREASING BLOOD PRESSURE, then please stop the meloxicam.  You can use the tramadol as needed for breakthrough pain during the day and a hydrocodone at night before bed. If you have any side effects to the tramadol and cannot tolerate it, try 1/2 tab of the hydrocodone during the day.   IF you received an x-ray today, you will receive an invoice from [facility]. Please contact [facility] at [phone] with questions or concerns regarding your invoice.   IF you received labwork today, you will receive an invoice from [facility]. Please contact [facility] at [phone] with questions or concerns regarding your invoice.   Our billing staff will not be able to assist you with questions regarding bills from these companies.  You will be contacted with the lab results as soon as they are available. The fastest way to get your results is to activate your My Chart account. Instructions are located on the last page of this paperwork. If you have not heard from [place] regarding the results in 2 weeks, please contact this office.     How to Use a Metered Dose Inhaler A metered dose inhaler is a handheld device for taking medicine that must be breathed into the lungs (inhaled). The device can be used to deliver a variety of inhaled medicines, including:  Quick relief or rescue medicines, such as bronchodilators.  Controller medicines, such as corticosteroids.  The medicine is  delivered by pushing down on a metal canister to release a preset amount of spray and medicine. Each device contains the amount of medicine that is needed for a preset number of uses (inhalations). Your health care provider may recommend that you use a spacer with your inhaler to help you take the medicine more effectively. A spacer is a plastic tube with a mouthpiece on one end and an opening that connects to the inhaler on the other end. A spacer holds the medicine in a tube for a short time, which allows you to inhale more medicine. What are the risks? If you do not use your inhaler correctly, medicine might not reach your lungs to help you breathe. Inhaler medicine can cause side effects, such as:  Mouth or throat infection.  Cough.  Hoarseness.  Headache.  Nausea and vomiting.  Lung infection (pneumonia) in people who have a lung condition called COPD.  How to use a metered dose inhaler without a spacer 1. Remove the cap from the inhaler. 2. If you are using the inhaler for the first time, shake it for 5 seconds, turn it away from your face, then release 4 puffs into the air. This is called priming. 3. Shake the inhaler for 5 seconds. 4. Position the inhaler so the top of the canister faces up. 5. Put your index finger on the top of the medicine canister. Support the bottom of the inhaler with your thumb. 6. Breathe out normally and as completely as possible, away from the inhaler. 7. Either place the  inhaler between your teeth and close your lips tightly around the mouthpiece, or hold the inhaler 1-2 inches (2.5-5 cm) away from your open mouth. Keep your tongue down out of the way. If you are unsure which technique to use, ask your health care provider. 8. Press the canister down with your index finger to release the medicine, then inhale deeply and slowly through your mouth (not your nose) until your lungs are completely filled. Inhaling should take 4-6 seconds. 9. Hold the medicine  in your lungs for 5-10 seconds (10 seconds is best). This helps the medicine get into the small airways of your lungs. 10. With your lips in a tight circle (pursed), breathe out slowly. 11. Repeat steps 3-10 until you have taken the number of puffs that your health care provider directed. Wait about 1 minute between puffs or as directed. 12. Put the cap on the inhaler. 13. If you are using a steroid inhaler, rinse your mouth with water, gargle, and spit out the water. Do not swallow the water. How to use a metered dose inhaler with a spacer 1. Remove the cap from the inhaler. 2. If you are using the inhaler for the first time, shake it for 5 seconds, turn it away from your face, then release 4 puffs into the air. This is called priming. 3. Shake the inhaler for 5 seconds. 4. Place the open end of the spacer onto the inhaler mouthpiece. 5. Position the inhaler so the top of the canister faces up and the spacer mouthpiece faces you. 6. Put your index finger on the top of the medicine canister. Support the bottom of the inhaler and the spacer with your thumb. 7. Breathe out normally and as completely as possible, away from the spacer. 8. Place the spacer between your teeth and close your lips tightly around it. Keep your tongue down out of the way. 9. Press the canister down with your index finger to release the medicine, then inhale deeply and slowly through your mouth (not your nose) until your lungs are completely filled. Inhaling should take 4-6 seconds. 10. Hold the medicine in your lungs for 5-10 seconds (10 seconds is best). This helps the medicine get into the small airways of your lungs. 11. With your lips in a tight circle (pursed), breathe out slowly. 12. Repeat steps 3-11 until you have taken the number of puffs that your health care provider directed. Wait about 1 minute between puffs or as directed. 13. Remove the spacer from the inhaler and put the cap on the inhaler. 14. If you are  using a steroid inhaler, rinse your mouth with water, gargle, and spit out the water. Do not swallow the water. Follow these instructions at home:  Take your inhaled medicine only as told by your health care provider. Do not use the inhaler more than directed by your health care provider.  Keep all follow-up visits as told by your health care provider. This is important.  If your inhaler has a counter, you can check it to determine how full your inhaler is. If your inhaler does not have a counter, ask your health care provider when you will need to refill your inhaler and write the refill date on a calendar or on your inhaler canister. Note that you cannot know when an inhaler is empty by shaking it.  Follow directions on the package insert for care and cleaning of your inhaler and spacer. Contact a health care provider if:  Symptoms are only  partially relieved with your inhaler.  You are having trouble using your inhaler.  You have an increase in phlegm.  You have headaches. Get help right away if:  You feel little or no relief after using your inhaler.  You have dizziness.  You have a fast heart rate.  You have chills or a fever.  You have night sweats.  There is blood in your phlegm. Summary  A metered dose inhaler is a handheld device for taking medicine that must be breathed into the lungs (inhaled).  The medicine is delivered by pushing down on a metal canister to release a preset amount of spray and medicine.  Each device contains the amount of medicine that is needed for a preset number of uses (inhalations). This information is not intended to replace advice given to you by your health care provider. Make sure you discuss any questions you have with your health care provider. Document Released: 02/23/2005 Document Revised: 01/14/2016 Document Reviewed: 01/14/2016 Elsevier Interactive Patient Education  2017 [employer]   Chronic Obstructive Pulmonary  Disease Chronic obstructive pulmonary disease (COPD) is a long-term (chronic) lung problem. When you have COPD, it is hard for air to get in and out of your lungs. The way your lungs work will never return to normal. Usually the condition gets worse over time. There are things you can do to keep yourself as healthy as possible. Your doctor may treat your condition with:  Medicines.  Quitting smoking, if you smoke.  Rehabilitation. This may involve a team of specialists.  Oxygen.  Exercise and changes to your diet.  Lung surgery.  Comfort measures (palliative care).  Follow these instructions at home: Medicines  Take over-the-counter and prescription medicines only as told by your doctor.  Talk to your doctor before taking any cough or allergy medicines. You may need to avoid medicines that cause your lungs to be dry. Lifestyle  If you smoke, stop. Smoking makes the problem worse. If you need help quitting, ask your doctor.  Avoid being around things that make your breathing worse. This may include smoke, chemicals, and fumes.  Stay active, but remember to also rest.  Learn and use tips on how to relax.  Make sure you get enough sleep. Most adults need at least 7 hours a night.  Eat healthy foods. Eat smaller meals more often. Rest before meals. Controlled breathing  Learn and use tips on how to control your breathing as told by your doctor. Try: ? Breathing in (inhaling) through your nose for 1 second. Then, pucker your lips and breath out (exhale) through your lips for 2 seconds. ? Putting one hand on your belly (abdomen). Breathe in slowly through your nose for 1 second. Your hand on your belly should move out. Pucker your lips and breathe out slowly through your lips. Your hand on your belly should move in as you breathe out. Controlled coughing  Learn and use controlled coughing to clear mucus from your lungs. The steps are: 1. Lean your head a little  forward. 2. Breathe in deeply. 3. Try to hold your breath for 3 seconds. 4. Keep your mouth slightly open while coughing 2 times. 5. Spit any mucus out into a tissue. 6. Rest and do the steps again 1 or 2 times as needed. General instructions  Make sure you get all the shots (vaccines) that your doctor recommends. Ask your doctor about a flu shot and a pneumonia shot.  Use oxygen therapy and therapy to help  improve your lungs (pulmonary rehabilitation) if told by your doctor. If you need home oxygen therapy, ask your doctor if you should buy a tool to measure your oxygen level (oximeter).  Make a COPD action plan with your doctor. This helps you know what to do if you feel worse than usual.  Manage any other conditions you have as told by your doctor.  Avoid going outside when it is very hot, cold, or humid.  Avoid people who have a sickness you can catch (contagious).  Keep all follow-up visits as told by your doctor. This is important. Contact a doctor if:  You cough up more mucus than usual.  There is a change in the color or thickness of the mucus.  It is harder to breathe than usual.  Your breathing is faster than usual.  You have trouble sleeping.  You need to use your medicines more often than usual.  You have trouble doing your normal activities such as getting dressed or walking around the house. Get help right away if:  You have shortness of breath while resting.  You have shortness of breath that stops you from: ? Being able to talk. ? Doing normal activities.  Your chest hurts for longer than 5 minutes.  Your skin color is more blue than usual.  Your pulse oximeter shows that you have low oxygen for longer than 5 minutes.  You have a fever.  You feel too tired to breathe normally. Summary  Chronic obstructive pulmonary disease (COPD) is a long-term lung problem.  The way your lungs work will never return to normal. Usually the condition gets worse  over time. There are things you can do to keep yourself as healthy as possible.  Take over-the-counter and prescription medicines only as told by your doctor.  If you smoke, stop. Smoking makes the problem worse. This information is not intended to replace advice given to you by your health care provider. Make sure you discuss any questions you have with your health care provider. Document Released: 08/12/2007 Document Revised: 08/01/2015 Document Reviewed: 10/20/2012 Elsevier Interactive Patient Education  2017 [employer].

## 2017-03-08 NOTE — Progress Notes (Signed)
Subjective:    Patient ID: Beth [name], female    DOB: Sep 08, 1950, 66 y.o.   MRN: [id number] Chief Complaint  Patient presents with  . Follow-up    patient presents to follow up on blood pressure as well as discuss pain medications    HPI  HTN: Monitors BP at home - several days ago BP was 139/83. BP uncontrolled at last OV 4d prior but pt attributed to white coat HTN and pain. Given clonidone 0.1mg  in office with minimal response.  Started pt on lisinopril-hctz 10-12.5 qd at visit 2 wks prior. BP at home has been 270-350 systolic - was 093G. No trouble tolerating the BP medications  Compression fractures: Advised APAP 1000mg  2-3x/d along with meloxicam 15mg . Did not want to start meloxicam that I rx'd to treat her back pain from compression fractures due to its side effect profile and the black box warning so has needed more hydrocodone - using qd.  Needed rare prn vicodin and/or methocarbamol for breakthrough pain.  However, the methocarbamol and tylenol really don't seem to touch her back at all and hasn't ever tried the hydrocodone during the day.  Has never tried tramadol.   Has appt with ortho on 1/21.  Back pain is keeping her from housework, cooking, grocery shopping - her husband is having to take care of everything.  Osteoporosis:Started fosamax 2 wks ago. Advised to sched mammogram and bone scan  Feels like she has had worsening COPD sxs  - have DOE.   Past Medical History:  Diagnosis Date  . Kidney stone    Past Surgical History:  Procedure Laterality Date  . EYE SURGERY    . TONSILLECTOMY     Current Outpatient Medications on File Prior to Visit  Medication Sig Dispense Refill  . acetaminophen (TYLENOL) 500 MG tablet Take 500 mg every 6 (six) hours as needed by mouth.    Marland Kitchen alendronate (FOSAMAX) 70 MG tablet Take 1 tablet (70 mg total) by mouth once a week. Take with a full glass of water on an empty stomach. 12 tablet 0  . lidocaine-prilocaine (EMLA) cream Apply 1  application topically as needed. To right ear lesion 30 g 0  . lisinopril-hydrochlorothiazide (PRINZIDE,ZESTORETIC) 10-12.5 MG tablet Take 1 tablet by mouth daily. 30 tablet 1  . mupirocin ointment (BACTROBAN) 2 % Apply 1 application topically 4 (four) times daily. 30 g 0  . meloxicam (MOBIC) 15 MG tablet Take 1 tablet (15 mg total) by mouth daily. (Patient not taking: Reported on 03/08/2017) 30 tablet 0   No current facility-administered medications on file prior to visit.    Allergies  Allergen Reactions  . Cefzil [Cefprozil] Other (See Comments)    Burning sensation in lower legs.   [name] [Tizanidine Hcl] Nausea And Vomiting   No family history on file. Social History   Socioeconomic History  . Marital status: Married    Spouse name: None  . Number of children: None  . Years of education: None  . Highest education level: None  Social Needs  . Financial resource strain: None  . Food insecurity - worry: None  . Food insecurity - inability: None  . Transportation needs - medical: None  . Transportation needs - non-medical: None  Occupational History  . None  Tobacco Use  . Smoking status: Former Research scientist (life sciences)  . Smokeless tobacco: Never Used  Substance and Sexual Activity  . Alcohol use: No  . Drug use: No  . Sexual activity: None  Other Topics  Concern  . None  Social History Narrative  . None   Depression screen [place] 2/9 03/08/2017 02/22/2017 02/18/2017 01/25/2017  Decreased Interest 0 0 0 0  Down, Depressed, Hopeless 0 0 0 0  PHQ - 2 Score 0 0 0 0    Review of Systems See hpi    Objective:   Physical Exam  Constitutional: She is oriented to person, place, and time. She appears well-developed and well-nourished. No distress.  HENT:  Head: Normocephalic and atraumatic.  Right Ear: External ear normal.  Left Ear: External ear normal.  Eyes: Conjunctivae are normal. No scleral icterus.  Neck: Normal range of motion. Neck supple. No thyromegaly present.    Cardiovascular: Normal rate, regular rhythm, normal heart sounds and intact distal pulses.  Pulmonary/Chest: Effort normal. No respiratory distress. She has decreased breath sounds. She has rales (bibasilar inspiratory, mild).  Musculoskeletal: She exhibits no edema.  Lymphadenopathy:    She has no cervical adenopathy.  Neurological: She is alert and oriented to person, place, and time.  Skin: Skin is warm and dry. She is not diaphoretic. No erythema.  Psychiatric: She has a normal mood and affect. Her behavior is normal.          BP (!) 144/82 (BP Location: Left Arm, Patient Position: Sitting, Cuff Size: Normal)   Pulse (!) 116   Temp 98.1 F (36.7 C) (Oral)   Resp 18   Ht 5' 2.5" (1.588 m)   Wt 121 lb 9.6 oz (55.2 kg)   SpO2 94%   BMI 21.89 kg/m     Office Spirometry Results:     Assessment & Plan:  Needs prevnar-13 at next OV.  1. Essential hypertension   2. Need for prophylactic vaccination and inoculation against influenza   3. Medication monitoring encounter   4. Non-traumatic compression fracture of vertebral column, sequela - Schedule eval with ortho spine.  Will benefit from PT so [facility].  5. Acute bilateral thoracic back pain - pt willing to try meloxicam 15 - start 1/2 tab qd or qod if can't cut.   6. Chronic obstructive pulmonary disease, unspecified COPD type ([place]) - no prior diagnosis but pt has presumed. Requested inhaler today - has never used one before. Pre and post spirometry done  7. Nodule of external ear, right - sched appt w/ ENT though I guess may need derm instead - possible Mohs. . .   8. Cutaneous horn - on tip of nose towards right side - removed with cryo  9. Age-related osteoporosis with current pathological fracture with routine healing, subsequent encounter     Orders Placed This Encounter  Procedures  . Flu Vaccine QUAD 36+ mos IM  . Comprehensive metabolic panel  . Care order/instruction:     Scheduling Instructions:     Spirometry IF NEB IS ORDERED PLEASE DO A SPIROMETRY BEFORE AND AFTER.  . Care order/instruction:    Scheduling Instructions:     Recheck BP    Meds ordered this encounter  Medications  . traMADol (ULTRAM) 50 MG tablet    Sig: Take 1 tablet (50 mg total) by mouth every 8 (eight) hours as needed.    Dispense:  30 tablet    Refill:  1  . albuterol (PROVENTIL HFA;VENTOLIN HFA) 108 (90 Base) MCG/ACT inhaler    Sig: Inhale 2 puffs into the lungs every 4 (four) hours as needed for wheezing or shortness of breath (cough, shortness of breath or wheezing.).  Dispense:  1 Inhaler    Refill:  1  . albuterol (PROVENTIL) (2.5 MG/3ML) 0.083% nebulizer solution 2.5 mg  . HYDROcodone-acetaminophen (NORCO/VICODIN) 5-325 MG tablet    Sig: Take 1 tablet by mouth at bedtime.    Dispense:  30 tablet    Refill:  0    Delman Cheadle, M.D.  Primary Care at [facility] [address] [place], [place] [address] [phone] phone [phone] fax  03/11/17 9:58 AM

## 2017-03-09 DIAGNOSIS — C44222 Squamous cell carcinoma of skin of right ear and external auricular canal: Secondary | ICD-10-CM

## 2017-03-09 HISTORY — DX: Squamous cell carcinoma of skin of right ear and external auricular canal: C44.222

## 2017-03-09 LAB — COMPREHENSIVE METABOLIC PANEL
ALT: 18 IU/L (ref 0–32)
AST: 26 IU/L (ref 0–40)
Albumin/Globulin Ratio: 1.5 (ref 1.2–2.2)
Albumin: 4.9 g/dL — ABNORMAL HIGH (ref 3.6–4.8)
Alkaline Phosphatase: 92 IU/L (ref 39–117)
BUN/Creatinine Ratio: 20 (ref 12–28)
BUN: 13 mg/dL (ref 8–27)
Bilirubin Total: 0.4 mg/dL (ref 0.0–1.2)
CO2: 24 mmol/L (ref 20–29)
Calcium: 9.9 mg/dL (ref 8.7–10.3)
Chloride: 98 mmol/L (ref 96–106)
Creatinine, Ser: 0.64 mg/dL (ref 0.57–1.00)
GFR calc Af Amer: 106 mL/min/{1.73_m2} (ref >59)
GFR calc non Af Amer: 92 mL/min/{1.73_m2} (ref >59)
Globulin, Total: 3.2 g/dL (ref 1.5–4.5)
Glucose: 104 mg/dL — ABNORMAL HIGH (ref 65–99)
Potassium: 4.3 mmol/L (ref 3.5–5.2)
Sodium: 141 mmol/L (ref 134–144)
Total Protein: 8.1 g/dL (ref 6.0–8.5)

## 2017-03-10 NOTE — Telephone Encounter (Signed)
Pt seen 12/31

## 2017-03-11 DIAGNOSIS — J449 Chronic obstructive pulmonary disease, unspecified: Secondary | ICD-10-CM | POA: Insufficient documentation

## 2017-03-11 DIAGNOSIS — M8000XA Age-related osteoporosis with current pathological fracture, unspecified site, initial encounter for fracture: Secondary | ICD-10-CM

## 2017-03-11 DIAGNOSIS — M546 Pain in thoracic spine: Secondary | ICD-10-CM | POA: Insufficient documentation

## 2017-03-11 DIAGNOSIS — R030 Elevated blood-pressure reading, without diagnosis of hypertension: Secondary | ICD-10-CM | POA: Insufficient documentation

## 2017-03-11 HISTORY — DX: Age-related osteoporosis with current pathological fracture, unspecified site, initial encounter for fracture: M80.00XA

## 2017-03-18 ENCOUNTER — Ambulatory Visit (INDEPENDENT_AMBULATORY_CARE_PROVIDER_SITE_OTHER): Payer: Medicare Other | Admitting: Otolaryngology

## 2017-03-18 DIAGNOSIS — C44201 Unspecified malignant neoplasm of skin of unspecified ear and external auricular canal: Secondary | ICD-10-CM | POA: Diagnosis not present

## 2017-03-18 DIAGNOSIS — C44222 Squamous cell carcinoma of skin of right ear and external auricular canal: Secondary | ICD-10-CM | POA: Diagnosis not present

## 2017-03-24 ENCOUNTER — Other Ambulatory Visit: Payer: Self-pay | Admitting: Otolaryngology

## 2017-03-29 ENCOUNTER — Ambulatory Visit
Admission: RE | Admit: 2017-03-29 | Discharge: 2017-03-29 | Disposition: A | Discharge status: Home / Self Care | Payer: Medicare Other | Source: Ambulatory Visit | Attending: Family Medicine | Admitting: Family Medicine

## 2017-03-29 DIAGNOSIS — E2839 Other primary ovarian failure: Secondary | ICD-10-CM

## 2017-03-29 DIAGNOSIS — M810 Age-related osteoporosis without current pathological fracture: Secondary | ICD-10-CM | POA: Diagnosis not present

## 2017-03-29 DIAGNOSIS — Z1239 Encounter for other screening for malignant neoplasm of breast: Secondary | ICD-10-CM

## 2017-03-29 DIAGNOSIS — M4850XA Collapsed vertebra, not elsewhere classified, site unspecified, initial encounter for fracture: Secondary | ICD-10-CM

## 2017-03-29 DIAGNOSIS — Z1231 Encounter for screening mammogram for malignant neoplasm of breast: Secondary | ICD-10-CM | POA: Diagnosis not present

## 2017-03-29 DIAGNOSIS — Z780 Asymptomatic menopausal state: Secondary | ICD-10-CM | POA: Diagnosis not present

## 2017-03-29 LAB — HM DEXA SCAN

## 2017-03-30 ENCOUNTER — Other Ambulatory Visit: Payer: Self-pay | Admitting: Family Medicine

## 2017-03-30 DIAGNOSIS — M8008XA Age-related osteoporosis with current pathological fracture, vertebra(e), initial encounter for fracture: Secondary | ICD-10-CM | POA: Diagnosis not present

## 2017-03-30 DIAGNOSIS — R928 Other abnormal and inconclusive findings on diagnostic imaging of breast: Secondary | ICD-10-CM

## 2017-04-02 ENCOUNTER — Ambulatory Visit
Admission: RE | Admit: 2017-04-02 | Discharge: 2017-04-02 | Disposition: A | Discharge status: Home / Self Care | Payer: Medicare Other | Source: Ambulatory Visit | Attending: Family Medicine | Admitting: Family Medicine

## 2017-04-02 DIAGNOSIS — N6489 Other specified disorders of breast: Secondary | ICD-10-CM | POA: Diagnosis not present

## 2017-04-02 DIAGNOSIS — R928 Other abnormal and inconclusive findings on diagnostic imaging of breast: Secondary | ICD-10-CM | POA: Diagnosis not present

## 2017-04-05 ENCOUNTER — Other Ambulatory Visit: Payer: Self-pay

## 2017-04-05 ENCOUNTER — Encounter (HOSPITAL_BASED_OUTPATIENT_CLINIC_OR_DEPARTMENT_OTHER): Payer: Self-pay | Admitting: *Deleted

## 2017-04-12 ENCOUNTER — Encounter (HOSPITAL_BASED_OUTPATIENT_CLINIC_OR_DEPARTMENT_OTHER)
Admission: RE | Disposition: A | Discharge status: Home / Self Care | Payer: Self-pay | Source: Ambulatory Visit | Attending: Otolaryngology

## 2017-04-12 ENCOUNTER — Encounter (HOSPITAL_BASED_OUTPATIENT_CLINIC_OR_DEPARTMENT_OTHER): Payer: Self-pay

## 2017-04-12 ENCOUNTER — Ambulatory Visit (HOSPITAL_BASED_OUTPATIENT_CLINIC_OR_DEPARTMENT_OTHER): Payer: Medicare Other | Admitting: Certified Registered"

## 2017-04-12 ENCOUNTER — Ambulatory Visit (HOSPITAL_BASED_OUTPATIENT_CLINIC_OR_DEPARTMENT_OTHER)
Admission: RE | Admit: 2017-04-12 | Discharge: 2017-04-12 | Disposition: A | Discharge status: Home / Self Care | Payer: Medicare Other | Source: Ambulatory Visit | Attending: Otolaryngology | Admitting: Otolaryngology

## 2017-04-12 ENCOUNTER — Other Ambulatory Visit: Payer: Self-pay

## 2017-04-12 DIAGNOSIS — C44222 Squamous cell carcinoma of skin of right ear and external auricular canal: Secondary | ICD-10-CM | POA: Diagnosis not present

## 2017-04-12 DIAGNOSIS — Z87891 Personal history of nicotine dependence: Secondary | ICD-10-CM | POA: Insufficient documentation

## 2017-04-12 DIAGNOSIS — Z888 Allergy status to other drugs, medicaments and biological substances status: Secondary | ICD-10-CM | POA: Diagnosis not present

## 2017-04-12 DIAGNOSIS — J449 Chronic obstructive pulmonary disease, unspecified: Secondary | ICD-10-CM | POA: Diagnosis not present

## 2017-04-12 DIAGNOSIS — Z79899 Other long term (current) drug therapy: Secondary | ICD-10-CM | POA: Insufficient documentation

## 2017-04-12 DIAGNOSIS — E876 Hypokalemia: Secondary | ICD-10-CM | POA: Diagnosis not present

## 2017-04-12 DIAGNOSIS — I10 Essential (primary) hypertension: Secondary | ICD-10-CM | POA: Insufficient documentation

## 2017-04-12 DIAGNOSIS — C44292 Other specified malignant neoplasm of skin of right ear and external auricular canal: Secondary | ICD-10-CM | POA: Diagnosis not present

## 2017-04-12 DIAGNOSIS — L988 Other specified disorders of the skin and subcutaneous tissue: Secondary | ICD-10-CM | POA: Diagnosis not present

## 2017-04-12 DIAGNOSIS — L98499 Non-pressure chronic ulcer of skin of other sites with unspecified severity: Secondary | ICD-10-CM | POA: Diagnosis not present

## 2017-04-12 HISTORY — PX: SKIN FULL THICKNESS GRAFT: SHX442

## 2017-04-12 HISTORY — DX: Chronic obstructive pulmonary disease, unspecified: J44.9

## 2017-04-12 HISTORY — DX: Essential (primary) hypertension: I10

## 2017-04-12 HISTORY — PX: MASS EXCISION: SHX2000

## 2017-04-12 SURGERY — EXCISION MASS
Anesthesia: General | Site: Ear | Laterality: Right

## 2017-04-12 MED ORDER — LACTATED RINGERS IV SOLN
INTRAVENOUS | Status: DC
  Administered 2017-04-12: 10:00:00 via INTRAVENOUS

## 2017-04-12 MED ORDER — NEOSTIGMINE METHYLSULFATE 10 MG/10ML IV SOLN
INTRAVENOUS | Status: DC | PRN
  Administered 2017-04-12: 3 mg via INTRAVENOUS

## 2017-04-12 MED ORDER — GLYCOPYRROLATE 0.2 MG/ML IJ SOLN
INTRAMUSCULAR | Status: DC | PRN
  Administered 2017-04-12: 0.6 mg via INTRAVENOUS

## 2017-04-12 MED ORDER — DEXAMETHASONE SODIUM PHOSPHATE 10 MG/ML IJ SOLN
INTRAMUSCULAR | Status: AC
  Filled 2017-04-12: qty 1

## 2017-04-12 MED ORDER — DEXAMETHASONE SODIUM PHOSPHATE 4 MG/ML IJ SOLN
INTRAMUSCULAR | Status: DC | PRN
  Administered 2017-04-12: 8 mg via INTRAVENOUS

## 2017-04-12 MED ORDER — ROCURONIUM BROMIDE 100 MG/10ML IV SOLN
INTRAVENOUS | Status: DC | PRN
  Administered 2017-04-12: 40 mg via INTRAVENOUS

## 2017-04-12 MED ORDER — ONDANSETRON HCL 4 MG/2ML IJ SOLN
INTRAMUSCULAR | Status: DC | PRN
  Administered 2017-04-12 (×2): 4 mg via INTRAVENOUS

## 2017-04-12 MED ORDER — PHENYLEPHRINE HCL 10 MG/ML IJ SOLN
INTRAMUSCULAR | Status: DC | PRN
  Administered 2017-04-12: 120 ug via INTRAVENOUS
  Administered 2017-04-12: 40 ug via INTRAVENOUS
  Administered 2017-04-12: 120 ug via INTRAVENOUS
  Administered 2017-04-12 (×4): 80 ug via INTRAVENOUS

## 2017-04-12 MED ORDER — LIDOCAINE 2% (20 MG/ML) 5 ML SYRINGE
INTRAMUSCULAR | Status: AC
  Filled 2017-04-12: qty 5

## 2017-04-12 MED ORDER — BACITRACIN ZINC 500 UNIT/GM EX OINT
TOPICAL_OINTMENT | CUTANEOUS | Status: AC
  Filled 2017-04-12: qty 0.9

## 2017-04-12 MED ORDER — MIDAZOLAM HCL 2 MG/2ML IJ SOLN
1.0000 mg | INTRAMUSCULAR | Status: DC | PRN
  Administered 2017-04-12: 1 mg via INTRAVENOUS

## 2017-04-12 MED ORDER — CLINDAMYCIN PHOSPHATE 600 MG/50ML IV SOLN
INTRAVENOUS | Status: DC | PRN
  Administered 2017-04-12: 600 mg via INTRAVENOUS

## 2017-04-12 MED ORDER — PROPOFOL 10 MG/ML IV BOLUS
INTRAVENOUS | Status: DC | PRN
  Administered 2017-04-12 (×2): 100 mg via INTRAVENOUS

## 2017-04-12 MED ORDER — FENTANYL CITRATE (PF) 100 MCG/2ML IJ SOLN
INTRAMUSCULAR | Status: AC
  Filled 2017-04-12: qty 2

## 2017-04-12 MED ORDER — OXYCODONE HCL 5 MG PO TABS: 5.0000 mg | ORAL_TABLET | Freq: Once | ORAL | Status: DC | PRN

## 2017-04-12 MED ORDER — FENTANYL CITRATE (PF) 100 MCG/2ML IJ SOLN: 25.0000 ug | INTRAMUSCULAR | Status: DC | PRN

## 2017-04-12 MED ORDER — BACITRACIN 500 UNIT/GM EX OINT
TOPICAL_OINTMENT | CUTANEOUS | Status: DC | PRN
  Administered 2017-04-12: 1 via TOPICAL

## 2017-04-12 MED ORDER — OXYCODONE-ACETAMINOPHEN 5-325 MG PO TABS: 1.0000 | ORAL_TABLET | ORAL | 0 refills | Status: DC | PRN

## 2017-04-12 MED ORDER — PROPOFOL 10 MG/ML IV BOLUS
INTRAVENOUS | Status: AC
  Filled 2017-04-12: qty 40

## 2017-04-12 MED ORDER — FENTANYL CITRATE (PF) 100 MCG/2ML IJ SOLN
50.0000 ug | INTRAMUSCULAR | Status: AC | PRN
  Administered 2017-04-12 (×2): 50 ug via INTRAVENOUS
  Administered 2017-04-12: 100 ug via INTRAVENOUS

## 2017-04-12 MED ORDER — ONDANSETRON HCL 4 MG/2ML IJ SOLN: 4.0000 mg | Freq: Four times a day (QID) | INTRAMUSCULAR | Status: DC | PRN

## 2017-04-12 MED ORDER — ONDANSETRON HCL 4 MG/2ML IJ SOLN
INTRAMUSCULAR | Status: AC
  Filled 2017-04-12: qty 2

## 2017-04-12 MED ORDER — MIDAZOLAM HCL 2 MG/2ML IJ SOLN
INTRAMUSCULAR | Status: AC
  Filled 2017-04-12: qty 2

## 2017-04-12 MED ORDER — OXYCODONE HCL 5 MG/5ML PO SOLN: 5.0000 mg | Freq: Once | ORAL | Status: DC | PRN

## 2017-04-12 MED ORDER — ROCURONIUM BROMIDE 10 MG/ML (PF) SYRINGE
PREFILLED_SYRINGE | INTRAVENOUS | Status: AC
  Filled 2017-04-12: qty 5

## 2017-04-12 MED ORDER — EPHEDRINE SULFATE 50 MG/ML IJ SOLN
INTRAMUSCULAR | Status: DC | PRN
  Administered 2017-04-12 (×3): 10 mg via INTRAVENOUS

## 2017-04-12 MED ORDER — SCOPOLAMINE 1 MG/3DAYS TD PT72: 1.0000 | MEDICATED_PATCH | Freq: Once | TRANSDERMAL | Status: DC | PRN

## 2017-04-12 MED ORDER — LIDOCAINE-EPINEPHRINE 1 %-1:100000 IJ SOLN
INTRAMUSCULAR | Status: DC | PRN
  Administered 2017-04-12: 5.5 mL

## 2017-04-12 MED ORDER — CLINDAMYCIN HCL 300 MG PO CAPS: 300.0000 mg | ORAL_CAPSULE | Freq: Three times a day (TID) | ORAL | 0 refills | Status: AC

## 2017-04-12 MED ORDER — CLINDAMYCIN PHOSPHATE 600 MG/50ML IV SOLN
INTRAVENOUS | Status: AC
  Filled 2017-04-12: qty 50

## 2017-04-12 SURGICAL SUPPLY — 42 items
APPLICATOR COTTON TIP 6IN STRL (MISCELLANEOUS) IMPLANT
BLADE SURG 15 STRL LF DISP TIS (BLADE) ×4 IMPLANT
BLADE SURG 15 STRL SS (BLADE) ×2
CANISTER SUCT 1200ML W/VALVE (MISCELLANEOUS) ×3 IMPLANT
COTTONBALL LRG STERILE PKG (GAUZE/BANDAGES/DRESSINGS) IMPLANT
COVER BACK TABLE 60X90IN (DRAPES) ×3 IMPLANT
COVER MAYO STAND STRL (DRAPES) ×3 IMPLANT
DECANTER SPIKE VIAL GLASS SM (MISCELLANEOUS) IMPLANT
DRAPE IMP U-DRAPE 54X76 (DRAPES) IMPLANT
DRAPE MICROSCOPE WILD 40.5X102 (DRAPES) IMPLANT
DRSG GLASSCOCK MASTOID ADT (GAUZE/BANDAGES/DRESSINGS) ×3 IMPLANT
ELECT COATED BLADE 2.86 ST (ELECTRODE) IMPLANT
ELECT NEEDLE BLADE 2-5/6 (NEEDLE) ×3 IMPLANT
ELECT REM PT RETURN 9FT ADLT (ELECTROSURGICAL) ×3
ELECTRODE REM PT RTRN 9FT ADLT (ELECTROSURGICAL) ×2 IMPLANT
GAUZE SPONGE 4X4 12PLY STRL (GAUZE/BANDAGES/DRESSINGS) ×3 IMPLANT
GAUZE SPONGE 4X4 12PLY STRL LF (GAUZE/BANDAGES/DRESSINGS) IMPLANT
GAUZE XEROFORM 5X9 LF (GAUZE/BANDAGES/DRESSINGS) ×3 IMPLANT
GLOVE BIO SURGEON STRL SZ7 (GLOVE) ×3 IMPLANT
GLOVE BIO SURGEON STRL SZ7.5 (GLOVE) ×6 IMPLANT
GLOVE SURG SS PI 7.0 STRL IVOR (GLOVE) ×6 IMPLANT
GOWN STRL REUS W/ TWL LRG LVL3 (GOWN DISPOSABLE) ×8 IMPLANT
GOWN STRL REUS W/ TWL XL LVL3 (GOWN DISPOSABLE) ×2 IMPLANT
GOWN STRL REUS W/TWL LRG LVL3 (GOWN DISPOSABLE) ×4
GOWN STRL REUS W/TWL XL LVL3 (GOWN DISPOSABLE) ×1
IV SET EXT 30 76VOL 4 MALE LL (IV SETS) IMPLANT
NEEDLE BLUNT 17GA (NEEDLE) IMPLANT
NEEDLE PRECISIONGLIDE 27X1.5 (NEEDLE) ×3 IMPLANT
NS IRRIG 1000ML POUR BTL (IV SOLUTION) ×3 IMPLANT
PACK BASIN DAY SURGERY FS (CUSTOM PROCEDURE TRAY) ×3 IMPLANT
PENCIL BUTTON HOLSTER BLD 10FT (ELECTRODE) ×3 IMPLANT
SHEET MEDIUM DRAPE 40X70 STRL (DRAPES) ×3 IMPLANT
SPONGE SURGIFOAM ABS GEL 12-7 (HEMOSTASIS) IMPLANT
SUT CHROMIC 4 0 P 3 18 (SUTURE) IMPLANT
SUT MON AB 5-0 P3 18 (SUTURE) ×3 IMPLANT
SUT PLAIN 5 0 P 3 18 (SUTURE) IMPLANT
SUT PROLENE 4 0 PS 2 18 (SUTURE) ×3 IMPLANT
SUT VICRYL 4-0 PS2 18IN ABS (SUTURE) ×3 IMPLANT
SYR CONTROL 10ML LL (SYRINGE) ×3 IMPLANT
TOWEL OR 17X24 6PK STRL BLUE (TOWEL DISPOSABLE) ×3 IMPLANT
TRAY DSU PREP LF (CUSTOM PROCEDURE TRAY) ×3 IMPLANT
TUBE CONNECTING 20X1/4 (TUBING) ×3 IMPLANT

## 2017-04-12 NOTE — Anesthesia Procedure Notes (Signed)
Procedure Name: Intubation Date/Time: 04/12/2017 11:06 AM Performed by: Verita Lamb, CRNA Pre-anesthesia Checklist: Patient identified, Emergency Drugs available, Suction available and Patient being monitored Patient Re-evaluated:Patient Re-evaluated prior to induction Oxygen Delivery Method: Circle system utilized Preoxygenation: Pre-oxygenation with 100% oxygen Induction Type: IV induction Ventilation: Mask ventilation without difficulty Laryngoscope Size: Mac and 3 Grade View: Grade I Tube type: Oral Tube size: 7.0 mm Number of attempts: 1 Airway Equipment and Method: Stylet and Oral airway Placement Confirmation: ETT inserted through vocal cords under direct vision,  positive ETCO2 and breath sounds checked- equal and bilateral Secured at: 22 cm Tube secured with: Tape Dental Injury: Teeth and Oropharynx as per pre-operative assessment

## 2017-04-12 NOTE — Anesthesia Preprocedure Evaluation (Signed)
Anesthesia Evaluation  Patient identified by MRN, date of birth, ID band Patient awake    Reviewed: Allergy & Precautions, H&P , NPO status , Patient's Chart, lab work & pertinent test results  Airway Mallampati: II   Neck ROM: full    Dental   Pulmonary COPD, former smoker,    breath sounds clear to auscultation       Cardiovascular hypertension,  Rhythm:regular Rate:Normal     Neuro/Psych    GI/Hepatic   Endo/Other    Renal/GU stones     Musculoskeletal   Abdominal   Peds  Hematology   Anesthesia Other Findings   Reproductive/Obstetrics                             Anesthesia Physical Anesthesia Plan  ASA: II  Anesthesia Plan: General   Post-op Pain Management:    Induction: Intravenous  PONV Risk Score and Plan: 3 and Ondansetron, Dexamethasone, Midazolam and Treatment may vary due to age or medical condition  Airway Management Planned: Oral ETT  Additional Equipment:   Intra-op Plan:   Post-operative Plan: Extubation in OR  Informed Consent: I have reviewed the patients History and Physical, chart, labs and discussed the procedure including the risks, benefits and alternatives for the proposed anesthesia with the patient or authorized representative who has indicated his/her understanding and acceptance.     Plan Discussed with: CRNA, Anesthesiologist and Surgeon  Anesthesia Plan Comments:         Anesthesia Quick Evaluation

## 2017-04-12 NOTE — H&P (Signed)
Cc: Right ear squamous cell carcinoma  HPI: The patient is a 67 y/o female with a right ear lesion. The patient is seen in consultation requested by Primary Care at [facility]. The patient has noted a lesion on her right ear for several years. The area has gradually gotten larger and has become painful when she lays on her right side. The patient has never had the lesion biopsied. The patient denies any significant history of sun exposure. Previous otologic surgery is denied.   The patient's review of systems (constitutional, eyes, ENT, cardiovascular, respiratory, GI, musculoskeletal, skin, neurologic, psychiatric, endocrine, hematologic, allergic) is noted in the ROS questionnaire.  It is reviewed with the patient.  Family health history: None.  Major events: Tonsillectomy, eye surgery.  Ongoing medical problems: Hypertension, cataracts, chronic bronchitis.  Social history: The patient is married. She is a former smoker. She denies the use of alcohol or illegal drugs.   Exam: General: Communicates without difficulty, well nourished, no acute distress. Head: Normocephalic, no evidence injury, no tenderness, facial buttresses intact without stepoff. Eyes: PERRL, EOMI. No scleral icterus, conjunctivae clear. Neuro: CN II exam reveals vision grossly intact.  No nystagmus at any point of gaze. Ears: Auricles well formed with a crusted 1cm lesion on her right ear.  Ear canals are intact without mass or lesion.  No erythema or edema is appreciated.  The TMs are intact without fluid. Nose: External evaluation reveals normal support and skin without lesions.  Dorsum is intact.  Anterior rhinoscopy reveals healthy pink mucosa over anterior aspect of inferior turbinates and intact septum.  No purulence noted. Oral:  Oral cavity and oropharynx are intact, symmetric, without erythema or edema.  Mucosa is moist without lesions. Neck: Full range of motion without pain.  There is no significant lymphadenopathy.  No  masses palpable.  Thyroid bed within normal limits to palpation.  Parotid glands and submandibular glands equal bilaterally without mass.  Trachea is midline. Neuro:  CN 2-12 grossly intact. Gait normal. Vestibular: No nystagmus at any point of gaze. The cerebellar examination is unremarkable.    Assessment A large right ear lesion is noted with the appearance suggestive of skin malignancy. The final biopsy results are consistent with squamous cell carcinoma.  Plan  1. The physical exam findings are reviewed with the patient.  2. Punch biopsy is obtained - results are consistent with squamous cell carcinoma. 3. The decision is therefore made to proceed with surgical resection of the squamous cell carcinoma, and reconstruction with full-thickness skin graft. The risks, benefits, and details of the procedure are discussed with the patient. Questions are invited and answered.

## 2017-04-12 NOTE — Transfer of Care (Signed)
Immediate Anesthesia Transfer of Care Note  Patient: Beth [name]  Procedure(s) Performed: EXCISION OF RIGHT EAR  MASS (Right Ear) SKIN GRAFT FULL THICKNESS FROM ABDOMEN TO RIGHT EAR (Left Abdomen)  Patient Location: PACU  Anesthesia Type:General  Level of Consciousness: awake, alert  and sedated  Airway & Oxygen Therapy: Patient Spontanous Breathing and Patient connected to face mask oxygen  Post-op Assessment: Report given to RN and Post -op Vital signs reviewed and stable  Post vital signs: Reviewed and stable  Last Vitals:  Vitals:   04/12/17 0920 04/12/17 1315  BP: (!) 166/67 (!) 148/75  Pulse: (!) 112 (!) 102  Resp: 20 (!) 21  Temp: 36.7 C (P) 36.9 C  SpO2: 100% 100%    Last Pain:  Vitals:   04/12/17 0920  TempSrc: Oral         Complications: No apparent anesthesia complications

## 2017-04-12 NOTE — Anesthesia Postprocedure Evaluation (Signed)
Anesthesia Post Note  Patient: Beth [name]  Procedure(s) Performed: EXCISION OF RIGHT EAR  MASS (Right Ear) SKIN GRAFT FULL THICKNESS FROM ABDOMEN TO RIGHT EAR (Left Abdomen)     Patient location during evaluation: PACU Anesthesia Type: General Level of consciousness: awake and alert Pain management: pain level controlled Vital Signs Assessment: post-procedure vital signs reviewed and stable Respiratory status: spontaneous breathing, nonlabored ventilation, respiratory function stable and patient connected to nasal cannula oxygen Cardiovascular status: blood pressure returned to baseline and stable Postop Assessment: no apparent nausea or vomiting Anesthetic complications: no    Last Vitals:  Vitals:   04/12/17 1338 04/12/17 1400  BP: 138/64 (!) 154/66  Pulse: 85 77  Resp: 17 16  Temp:  36.5 C  SpO2: 99% 96%    Last Pain:  Vitals:   04/12/17 1338  TempSrc:   PainSc: 0-No pain                 Nasif Bos S

## 2017-04-12 NOTE — Op Note (Signed)
DATE OF PROCEDURE:  04/12/2017                              OPERATIVE REPORT  SURGEON:  Leta Baptist, MD  PREOPERATIVE DIAGNOSES: 1. Right ear squamous cell carcinoma  POSTOPERATIVE DIAGNOSES: 1. Right ear squamous cell carcinoma  PROCEDURE PERFORMED:   1. Excision of right ear squamous cell carcinoma (2.5 cm diameter) 2. Full-thickness skin graft harvesting from abdominal wall.  ANESTHESIA:  General endotracheal tube anesthesia.  COMPLICATIONS:  None.  ESTIMATED BLOOD LOSS:  Minimal.  INDICATION FOR PROCEDURE:  Beth [name] is a 67 y.o. female with a history of an enlarging right ear lesion. Biopsy of the lesion was consistent with squamous cell carcinoma. Based on the above findings, the decision was made for the patient to undergo the above-stated procedures. The risks, benefits, alternatives, and details of the procedure were discussed with the patient.  Questions were invited and answered.  Informed consent was obtained.  DESCRIPTION:  The patient was taken to the operating room and placed supine on the operating table.  General endotracheal tube anesthesia was administered by the anesthesiologist.  The patient was positioned and prepped and draped in a standard fashion for right ear surgery.    1% lidocaine with 1-100,000 epinephrine was infiltrated around the right ear lesion. The lesion was centered around the scaphoid fossa. A circular incision was made around the lesion. The entire lesion was removed, including the underlying perichondrium. Circumferential and deep margins were also removed. The specimens was also sent to the pathology department for frozen section analysis. The margins were clear of malignancy.  Attention was then focused on harvesting the full-thickness skin graft. 1% lidocaine with 1-100,000 epinephrine was infiltrated in the left lower quadrant abdominal wall. A 3 cm circular Full-thickness skin graft was harvested in a standard fashion. The donor site was  copiously irrigated and closed in layers with 4-0 Vicryl and Dermabond.  The full-thickness skin graft was then used to cover the right ear surgical defect.  A bolster dressing was applied.  The care of the patient was turned over to the anesthesiologist.  The patient was awakened from anesthesia without difficulty.  The patient was extubated and transferred to the recovery room in good condition.  OPERATIVE FINDINGS:  Right ear squamous cell carcinoma.   SPECIMEN:  Right ear squamous cell carcinoma, peripheral margins, and deep margin.   FOLLOWUP CARE:  The patient will be discharged home once awake and alert.   The patient will follow up in my office in approximately 1 week.  Jenie Parish W Kobie Matkins 04/12/2017 1:02 PM

## 2017-04-12 NOTE — Discharge Instructions (Addendum)
The patient may remove her Glasscock dressing tomorrow. She should leave the bolster in place for one week.  The patient may resume all her previous activities and diet. She will follow-up in my office in one week.   Post Anesthesia Home Care Instructions  Activity: Get plenty of rest for the remainder of the day. A responsible individual must stay with you for 24 hours following the procedure.  For the next 24 hours, DO NOT: -Drive a car -Paediatric nurse -Drink alcoholic beverages -Take any medication unless instructed by your physician -Make any legal decisions or sign important papers.  Meals: Start with liquid foods such as gelatin or soup. Progress to regular foods as tolerated. Avoid greasy, spicy, heavy foods. If nausea and/or vomiting occur, drink only clear liquids until the nausea and/or vomiting subsides. Call your physician if vomiting continues.  Special Instructions/Symptoms: Your throat may feel dry or sore from the anesthesia or the breathing tube placed in your throat during surgery. If this causes discomfort, gargle with warm salt water. The discomfort should disappear within 24 hours.  If you had a scopolamine patch placed behind your ear for the management of post- operative nausea and/or vomiting:  1. The medication in the patch is effective for 72 hours, after which it should be removed.  Wrap patch in a tissue and discard in the trash. Wash hands thoroughly with soap and water. 2. You may remove the patch earlier than 72 hours if you experience unpleasant side effects which may include dry mouth, dizziness or visual disturbances. 3. Avoid touching the patch. Wash your hands with soap and water after contact with the patch.

## 2017-04-14 ENCOUNTER — Encounter (HOSPITAL_BASED_OUTPATIENT_CLINIC_OR_DEPARTMENT_OTHER): Payer: Self-pay | Admitting: Otolaryngology

## 2017-04-19 ENCOUNTER — Ambulatory Visit (INDEPENDENT_AMBULATORY_CARE_PROVIDER_SITE_OTHER): Payer: Medicare Other | Admitting: Otolaryngology

## 2017-04-27 DIAGNOSIS — M8008XD Age-related osteoporosis with current pathological fracture, vertebra(e), subsequent encounter for fracture with routine healing: Secondary | ICD-10-CM | POA: Diagnosis not present

## 2017-05-31 ENCOUNTER — Ambulatory Visit (INDEPENDENT_AMBULATORY_CARE_PROVIDER_SITE_OTHER): Payer: Medicare Other | Admitting: Otolaryngology

## 2017-06-24 DIAGNOSIS — M8008XD Age-related osteoporosis with current pathological fracture, vertebra(e), subsequent encounter for fracture with routine healing: Secondary | ICD-10-CM | POA: Diagnosis not present

## 2017-06-24 DIAGNOSIS — M810 Age-related osteoporosis without current pathological fracture: Secondary | ICD-10-CM | POA: Diagnosis not present

## 2017-07-10 ENCOUNTER — Encounter: Payer: Self-pay | Admitting: Family Medicine

## 2017-07-10 ENCOUNTER — Other Ambulatory Visit: Payer: Self-pay

## 2017-07-10 ENCOUNTER — Ambulatory Visit (INDEPENDENT_AMBULATORY_CARE_PROVIDER_SITE_OTHER): Payer: Medicare Other | Admitting: Family Medicine

## 2017-07-10 VITALS — BP 142/84 | HR 105 | Temp 98.4°F | Resp 16 | Ht 62.5 in | Wt 128.2 lb

## 2017-07-10 DIAGNOSIS — L570 Actinic keratosis: Secondary | ICD-10-CM

## 2017-07-10 DIAGNOSIS — L989 Disorder of the skin and subcutaneous tissue, unspecified: Secondary | ICD-10-CM

## 2017-07-10 DIAGNOSIS — L718 Other rosacea: Secondary | ICD-10-CM

## 2017-07-10 DIAGNOSIS — I2510 Atherosclerotic heart disease of native coronary artery without angina pectoris: Secondary | ICD-10-CM | POA: Diagnosis not present

## 2017-07-10 DIAGNOSIS — R030 Elevated blood-pressure reading, without diagnosis of hypertension: Secondary | ICD-10-CM

## 2017-07-10 DIAGNOSIS — Z87891 Personal history of nicotine dependence: Secondary | ICD-10-CM

## 2017-07-10 DIAGNOSIS — J449 Chronic obstructive pulmonary disease, unspecified: Secondary | ICD-10-CM | POA: Diagnosis not present

## 2017-07-10 DIAGNOSIS — J9811 Atelectasis: Secondary | ICD-10-CM | POA: Diagnosis not present

## 2017-07-10 DIAGNOSIS — Z5181 Encounter for therapeutic drug level monitoring: Secondary | ICD-10-CM

## 2017-07-10 DIAGNOSIS — M8000XD Age-related osteoporosis with current pathological fracture, unspecified site, subsequent encounter for fracture with routine healing: Secondary | ICD-10-CM

## 2017-07-10 LAB — PULMONARY FUNCTION TEST

## 2017-07-10 MED ORDER — BUDESONIDE-FORMOTEROL FUMARATE 160-4.5 MCG/ACT IN AERO: 2.0000 | INHALATION_SPRAY | Freq: Two times a day (BID) | RESPIRATORY_TRACT | 5 refills | Status: DC

## 2017-07-10 MED ORDER — METRONIDAZOLE 1 % EX GEL: Freq: Every day | CUTANEOUS | 2 refills | Status: DC

## 2017-07-10 MED ORDER — SPACER/AERO-HOLDING CHAMBERS DEVI: 1.0000 [IU] | 99 refills | Status: DC | PRN

## 2017-07-10 MED ORDER — ALENDRONATE SODIUM 70 MG PO TABS: 70.0000 mg | ORAL_TABLET | ORAL | 3 refills | Status: DC

## 2017-07-10 NOTE — Progress Notes (Addendum)
Subjective:  By signing my name below, I, Beth [name], attest that this documentation has been prepared under the direction and in the presence of Delman Cheadle, MD Electronically Signed: Ladene Artist, ED Scribe 07/10/2017 at 10:09 AM.   Patient ID: Beth [name], female    DOB: March 15, 1950, 67 y.o.   MRN: [id number]  Chief Complaint  Patient presents with  . Medication Refill    albuterol, lisinopril HCTZ, sore on nose    HPI Beth [name] is a 67 y.o. female who presents to Primary Care at [facility] f/u on HTN and other chronic medical conditions inc osteoporosis and skin cnacer.   HTN Takes Lisinopril-HCTZ 10-12.5 started 6 months prior. - Pt has been checking her BP daily outside of the office. Reports systolic readings ranging 98-110, with the highest being 127 a few wks ago. She states that she did not take her BP medication for an entire wk with systolic BP readings ranging 108-113. Denies lightheadedness, dizziness.  Osteoporosis Started fosamax 6 months prior. Bone density done 4 months prior with T score -3.1 in R femur. Has had vertebral compression fractures, using Tylenol several times/day in addition to occasional narcotics. - States Dr. Sherlyn Lick [facility] had her stop her fosamax while she started on Calcitonin nasal spray. She has completed nasal spray now. Denies side-effects from fosamax. She is only taking Tramadol once-twice/wk for back pain which does not limit her from activities. Has not used diclofenac gel for several days which she was using for dull, aching R hip pain.  COPD Prn albuterol. - Pt has been using inhaler 3-4 times/day, depending on the weather and her activity. She feels like she is not getting the medication into her lungs. Pt requests a spacer at this visit. Tried to by one otc at [facility] but was told she needs a rx.  Right Pinna Skin cancer Pt had an ulcerated skin nodule removed from her R ear requiring skin grafting on  2/4 by ENT Dr. Benjamine Mola. Very suspicious for basal cell but pathology was neg for any tumor. - Last visit was ~3 wks ago; pt states that she has a f/u in June since the reports showed cancer. She has noted some tenderness that she attributes to the healing process. Pt has applied Aveeno to the area.  Nose Pt presents with a red area to the R side of her nose. She had the area frozen off which caused a scab for a month that fell off and returned to the same state within 1 wk. She reports that the area is still tender to touch and has gradually enlarged in size over the past yr. Pt has applied neosporin without relief.  H/o Tobacco Use Quit 01/2017. Pt started smoking at age 71 up to 1 ppd.  Past Medical History:  Diagnosis Date  . COPD (chronic obstructive pulmonary disease) ([place])   . Ear lesion    right  . Hypertension   . Kidney stone    Current Outpatient Medications on File Prior to Visit  Medication Sig Dispense Refill  . acetaminophen (TYLENOL) 500 MG tablet Take 500 mg every 6 (six) hours as needed by mouth.    Marland Kitchen albuterol (PROVENTIL HFA;VENTOLIN HFA) 108 (90 Base) MCG/ACT inhaler Inhale 2 puffs into the lungs every 4 (four) hours as needed for wheezing or shortness of breath (cough, shortness of breath or wheezing.). 1 Inhaler 1  . alendronate (FOSAMAX) 70 MG tablet Take 1 tablet (70 mg total) by  mouth once a week. Take with a full glass of water on an empty stomach. 12 tablet 0  . calcitonin, salmon, (MIACALCIN/FORTICAL) 200 UNIT/ACT nasal spray Place 1 spray into alternate nostrils daily.    Marland Kitchen lisinopril-hydrochlorothiazide (PRINZIDE,ZESTORETIC) 10-12.5 MG tablet Take 1 tablet by mouth daily. 30 tablet 1  . oxyCODONE-acetaminophen (PERCOCET/ROXICET) 5-325 MG tablet Take 1 tablet by mouth every 4 (four) hours as needed for severe pain. 20 tablet 0  . traMADol (ULTRAM) 50 MG tablet Take 1 tablet (50 mg total) by mouth every 8 (eight) hours as needed. 30 tablet 1   No current  facility-administered medications on file prior to visit.    Past Surgical History:  Procedure Laterality Date  . EYE SURGERY    . MASS EXCISION Right 04/12/2017   Procedure: EXCISION OF RIGHT EAR  MASS;  Surgeon: Leta Baptist, MD;  Location: [facility];  Service: ENT;  Laterality: Right;  . SKIN FULL THICKNESS GRAFT Left 04/12/2017   Procedure: SKIN GRAFT FULL THICKNESS FROM ABDOMEN TO RIGHT EAR;  Surgeon: Leta Baptist, MD;  Location: [place];  Service: ENT;  Laterality: Left;  . TONSILLECTOMY      Allergies  Allergen Reactions  . Cefzil [Cefprozil] Other (See Comments)    Burning sensation in lower legs.   [name] [Tizanidine Hcl] Nausea And Vomiting   History reviewed. No pertinent family history. Social History   Socioeconomic History  . Marital status: Married    Spouse name: Not on file  . Number of children: Not on file  . Years of education: Not on file  . Highest education level: Not on file  Occupational History  . Not on file  Social Needs  . Financial resource strain: Not on file  . Food insecurity:    Worry: Not on file    Inability: Not on file  . Transportation needs:    Medical: Not on file    Non-medical: Not on file  Tobacco Use  . Smoking status: Former Smoker    Last attempt to quit: 02/03/2017    Years since quitting: 0.4  . Smokeless tobacco: Never Used  Substance and Sexual Activity  . Alcohol use: No  . Drug use: No  . Sexual activity: Not on file  Lifestyle  . Physical activity:    Days per week: Not on file    Minutes per session: Not on file  . Stress: Not on file  Relationships  . Social connections:    Talks on phone: Not on file    Gets together: Not on file    Attends religious service: Not on file    Active member of club or organization: Not on file    Attends meetings of clubs or organizations: Not on file    Relationship status: Not on file  Other Topics Concern  . Not on file  Social History  Narrative  . Not on file   Depression screen [facility] 2/9 07/10/2017 03/08/2017 02/22/2017 02/18/2017 01/25/2017  Decreased Interest 0 0 0 0 0  Down, Depressed, Hopeless 0 0 0 0 0  PHQ - 2 Score 0 0 0 0 0     Review of Systems  Respiratory: Positive for shortness of breath (due to COPD).   Skin: Positive for color change (R side of nose) and wound (R ear; improving).  Neurological: Negative for dizziness and light-headedness.      Objective:   Physical Exam  Constitutional: She is oriented to person, place,  and time. She appears well-developed and well-nourished. No distress.  HENT:  Head: Normocephalic and atraumatic.  Eyes: Conjunctivae and EOM are normal.  Neck: Neck supple. No tracheal deviation present.  Cardiovascular: Regular rhythm. Tachycardia present.  Pulmonary/Chest: Effort normal. No respiratory distress.  Decreased air movement.  Musculoskeletal: Normal range of motion.  Neurological: She is alert and oriented to person, place, and time.  Skin: Skin is warm and dry.  Psychiatric: She has a normal mood and affect. Her behavior is normal.  Nursing note and vitals reviewed.     BP (!) 168/80   Pulse (!) 105   Temp 98.4 F (36.9 C)   Resp 16   Ht 5' 2.5" (1.588 m)   Wt 128 lb 3.2 oz (58.2 kg)   SpO2 95%   BMI 23.07 kg/m     Office Spirometry Results: FEV1: 0.7 liters FVC: 1.87 liters FEV1/FVC: 37.4 % FVC  % Predicted: 68 % FEV1 % Predicted: 33 % FeF 25-75: 6.98 liters FeF 25-75 % Predicted: 16     Assessment & Plan:   1. Essential hypertension - severe white coat HTN - pt reports she has been off of her lisinopril-hctz 10-12.5 for the entire week and BP has still been consistently below goal outside office so OK to stay off of any BP meds for now and cont to monitor at home. Advised if SBP>160 can take 1/2 tab of her lisinopril-hctz and if SBP>180 at home can talking 1 whole tab.  Bring home BP logs to next OV.  2. Chronic obstructive pulmonary  disease, unspecified COPD type ([place]) - cannot find the spirometry we did last time anywhere in Epic so repeated today. Using rescue albuterol 3-4x/d - needs to start preventative med - start symbicort SPIROMETRY consistent with GOLD stage 3 - severe. Consider repeating PFTs on symbicort at next OV ADDENDUM: screening chest CT w/ partial collapse of right mid-lung - will refer pt to pulmonology to ensure she is being most effectively managed.  3. Age-related osteoporosis with current pathological fracture with routine healing, subsequent encounter - completed course of calcitonin from Dr. Sherlyn Lick to help heal her vertebral compression frxs so now ready to resume fosamax (was only on it sev mos before it was temporarily changed to calcitonin)  4. Medication monitoring encounter   5. History of tobacco use   6. External nasal lesion - I suspected lesion was an actinic keratosis but apparently the cryosurgery we tried last visit did almost nothing to it - lesion came back immed - which is highly atypical of an AK. Due to pt's recent diagnosis of skin cancer on her ear, I think it prudent that she see derm for further eval - make sure bx not indicated  7. Actinic keratosis of multiple sites of head and neck   8. Rosacea keratitis     Orders Placed This Encounter  Procedures  . Comprehensive metabolic panel  . Ambulatory Referral for Lung Cancer Scre    Referral Priority:   Routine    Referral Type:   Consultation    Referral Reason:   Specialty Services Required    Number of Visits Requested:   1  . Ambulatory referral to Dermatology    Referral Priority:   Routine    Referral Type:   Consultation    Referral Reason:   Specialty Services Required    Requested Specialty:   Dermatology    Number of Visits Requested:   1  . Care order/instruction:  Scheduling Instructions:     Spirometry IF NEB IS ORDERED PLEASE DO A SPIROMETRY BEFORE AND AFTER.    Meds ordered this encounter  Medications    . alendronate (FOSAMAX) 70 MG tablet    Sig: Take 1 tablet (70 mg total) by mouth once a week. Take with a full glass of water on an empty stomach.    Dispense:  12 tablet    Refill:  3  . metroNIDAZOLE (METROGEL) 1 % gel    Sig: Apply topically daily.    Dispense:  60 g    Refill:  2  . budesonide-formoterol (SYMBICORT) 160-4.5 MCG/ACT inhaler    Sig: Inhale 2 puffs into the lungs 2 (two) times daily.    Dispense:  1 Inhaler    Refill:  5  . DISCONTD: Spacer/Aero-Holding Chambers DEVI    Sig: 1 Units by Does not apply route as needed.    Dispense:  1 each    Refill:  prn    Spacer-device to use with albuterol  . Spacer/Aero-Holding Chambers DEVI    Sig: 1 Units by Does not apply route as needed. Dx: J44.9 COPD    Dispense:  1 each    Refill:  prn    Spacer-device to use with albuterol    I personally performed the services described in this documentation, which was scribed in my presence. The recorded information has been reviewed and considered, and addended by me as needed.   Delman Cheadle, M.D.  Primary Care at [facility] [address] [place], [place] [address] [phone] phone [phone] fax  07/22/17 12:36 AM

## 2017-07-10 NOTE — Patient Instructions (Addendum)
Top BP # >160 take 1/2 tab BP med - lisinopril-hctz 10-12.5  Top BP #> 180, take 1 whole tab lisinopril-hctz 10-12.5  Continue to check your BP at home one daily but vary the time of day. Bring in your readings to your next office visit.  Sign a release with the front desk so we can get a copy of Dr. Sherrell Puller records.    IF you received an x-ray today, you will receive an invoice from [facility]. Please contact [facility] at [phone] with questions or concerns regarding your invoice.   IF you received labwork today, you will receive an invoice from [facility]. Please contact [facility] at [phone] with questions or concerns regarding your invoice.   Our billing staff will not be able to assist you with questions regarding bills from these companies.  You will be contacted with the lab results as soon as they are available. The fastest way to get your results is to activate your My Chart account. Instructions are located on the last page of this paperwork. If you have not heard from [place] regarding the results in 2 weeks, please contact this office.      Osteoporosis Osteoporosis is the thinning and loss of density in the bones. Osteoporosis makes the bones more brittle, fragile, and likely to break (fracture). Over time, osteoporosis can cause the bones to become so weak that they fracture after a simple fall. The bones most likely to fracture are the bones in the hip, wrist, and spine. What are the causes? The exact cause is not known. What increases the risk? Anyone can develop osteoporosis. You may be at greater risk if you have a family history of the condition or have poor nutrition. You may also have a higher risk if you are:  Female.  77 years old or older.  A smoker.  Not physically active.  White or Asian.  Slender.  What are the signs or symptoms? A fracture might be the first sign of the disease, especially if it results from a fall or  injury that would not usually cause a bone to break. Other signs and symptoms include:  Low back and neck pain.  Stooped posture.  Height loss.  How is this diagnosed? To make a diagnosis, your health care provider may:  Take a medical history.  Perform a physical exam.  Order tests, such as: ? A bone mineral density test. ? A dual-energy X-ray absorptiometry test.  How is this treated? The goal of osteoporosis treatment is to strengthen your bones to reduce your risk of a fracture. Treatment may involve:  Making lifestyle changes, such as: ? Eating a diet rich in calcium. ? Doing weight-bearing and muscle-strengthening exercises. ? Stopping tobacco use. ? Limiting alcohol intake.  Taking medicine to slow the process of bone loss or to increase bone density.  Monitoring your levels of calcium and vitamin D.  Follow these instructions at home:  Include calcium and vitamin D in your diet. Calcium is important for bone health, and vitamin D helps the body absorb calcium.  Perform weight-bearing and muscle-strengthening exercises as directed by your health care provider.  Do not use any tobacco products, including cigarettes, chewing tobacco, and electronic cigarettes. If you need help quitting, ask your health care provider.  Limit your alcohol intake.  Take medicines only as directed by your health care provider.  Keep all follow-up visits as directed by your health care provider. This is important.  Take precautions at home to  lower your risk of falling, such as: ? Keeping rooms well lit and clutter free. ? Installing safety rails on stairs. ? Using rubber mats in the bathroom and other areas that are often wet or slippery. Get help right away if: You fall or injure yourself. This information is not intended to replace advice given to you by your health care provider. Make sure you discuss any questions you have with your health care provider. Document Released:  12/03/2004 Document Revised: 07/29/2015 Document Reviewed: 08/03/2013 Elsevier Interactive Patient Education  [employer].

## 2017-07-11 LAB — COMPREHENSIVE METABOLIC PANEL
ALT: 19 IU/L (ref 0–32)
AST: 26 IU/L (ref 0–40)
Albumin/Globulin Ratio: 1.9 (ref 1.2–2.2)
Albumin: 5 g/dL — ABNORMAL HIGH (ref 3.6–4.8)
Alkaline Phosphatase: 73 IU/L (ref 39–117)
BUN/Creatinine Ratio: 19 (ref 12–28)
BUN: 14 mg/dL (ref 8–27)
Bilirubin Total: 0.3 mg/dL (ref 0.0–1.2)
CO2: 25 mmol/L (ref 20–29)
Calcium: 10 mg/dL (ref 8.7–10.3)
Chloride: 100 mmol/L (ref 96–106)
Creatinine, Ser: 0.75 mg/dL (ref 0.57–1.00)
GFR calc Af Amer: 94 mL/min/{1.73_m2} (ref >59)
GFR calc non Af Amer: 82 mL/min/{1.73_m2} (ref >59)
Globulin, Total: 2.6 g/dL (ref 1.5–4.5)
Glucose: 127 mg/dL — ABNORMAL HIGH (ref 65–99)
Potassium: 4.7 mmol/L (ref 3.5–5.2)
Sodium: 143 mmol/L (ref 134–144)
Total Protein: 7.6 g/dL (ref 6.0–8.5)

## 2017-07-12 ENCOUNTER — Other Ambulatory Visit: Payer: Self-pay | Admitting: Acute Care

## 2017-07-12 DIAGNOSIS — Z122 Encounter for screening for malignant neoplasm of respiratory organs: Secondary | ICD-10-CM

## 2017-07-12 DIAGNOSIS — Z87891 Personal history of nicotine dependence: Secondary | ICD-10-CM

## 2017-07-21 ENCOUNTER — Ambulatory Visit (INDEPENDENT_AMBULATORY_CARE_PROVIDER_SITE_OTHER): Payer: Medicare Other | Admitting: Acute Care

## 2017-07-21 ENCOUNTER — Encounter: Payer: Self-pay | Admitting: Acute Care

## 2017-07-21 ENCOUNTER — Ambulatory Visit (INDEPENDENT_AMBULATORY_CARE_PROVIDER_SITE_OTHER)
Admission: RE | Admit: 2017-07-21 | Discharge: 2017-07-21 | Disposition: A | Discharge status: Home / Self Care | Payer: Medicare Other | Source: Ambulatory Visit | Attending: Acute Care | Admitting: Acute Care

## 2017-07-21 DIAGNOSIS — Z87891 Personal history of nicotine dependence: Secondary | ICD-10-CM

## 2017-07-21 DIAGNOSIS — Z122 Encounter for screening for malignant neoplasm of respiratory organs: Secondary | ICD-10-CM

## 2017-07-21 NOTE — Progress Notes (Signed)
Shared Decision Making Visit Lung Cancer Screening Program [phone])   Eligibility:  Age 67 y.o.  Pack Years Smoking History Calculation 52 pack year smoking history (# packs/per year x # years smoked)  Recent History of coughing up blood  no  Unexplained weight loss? no ( >Than 15 pounds within the last 6 months )  Prior History Lung / other cancer no (Diagnosis within the last 5 years already requiring surveillance chest CT Scans).  Smoking Status Former Smoker  Former Smokers: Years since quit: < 1 year  Quit Date: 01/2017  Visit Components:  Discussion included one or more decision making aids. yes  Discussion included risk/benefits of screening. yes  Discussion included potential follow up diagnostic testing for abnormal scans. yes  Discussion included meaning and risk of over diagnosis. yes  Discussion included meaning and risk of False Positives. yes  Discussion included meaning of total radiation exposure. yes  Counseling Included:  Importance of adherence to annual lung cancer LDCT screening. yes  Impact of comorbidities on ability to participate in the program. yes  Ability and willingness to under diagnostic treatment. yes  Smoking Cessation Counseling:  Current Smokers:   Discussed importance of smoking cessation. NA  Information about tobacco cessation classes and interventions provided to patient. yes  Patient provided with "ticket" for LDCT Scan. yes  Symptomatic Patient. no   Diagnosis Code: Tobacco Use Z72.0  Asymptomatic Patient yes  Counseling (Intermediate counseling: > three minutes counseling) Q0347  Former Smokers:   Discussed the importance of maintaining cigarette abstinence. yes  Diagnosis Code: Personal History of Nicotine Dependence. Q25.956  Information about tobacco cessation classes and interventions provided to patient. Yes  Patient provided with "ticket" for LDCT Scan. yes  Written Order for Lung Cancer Screening  with LDCT placed in Epic. Yes (CT Chest Lung Cancer Screening Low Dose W/O CM) [id number] Z12.2-Screening of respiratory organs Z87.891-Personal history of nicotine dependence  I spent 25 minutes of face to face time with Ms. Erney discussing the risks and benefits of lung cancer screening. We viewed a power point together that explained in detail the above noted topics. We took the time to pause the power point at intervals to allow for questions to be asked and answered to ensure understanding. We discussed that she had taken the single most powerful action possible to decrease her risk of developing lung cancer when she quit smoking. I counseled her to remain smoke free, and to contact me if she ever had the desire to smoke again so that I can provide resources and tools to help support the effort to remain smoke free. We discussed the time and location of the scan, and that either  Doroteo Glassman RN or I will call with the results within  24-48 hours of receiving them. She has my card and contact information in the event she needs to speak with me, in addition to a copy of the power point we reviewed as a resource. She verbalized understanding of all of the above and had no further questions upon leaving the office.     I explained to the patient that there has been a high incidence of coronary artery disease noted on these exams. I explained that this is a non-gated exam therefore degree or severity cannot be determined. This patient is not on statin therapy. I have asked the patient to follow-up with their PCP regarding any incidental finding of coronary artery disease and management with diet or medication as they feel is clinically  indicated. The patient verbalized understanding of the above and had no further questions.     Magdalen Spatz, NP 07/21/2017 11:27 AM

## 2017-07-22 ENCOUNTER — Encounter: Payer: Self-pay | Admitting: Family Medicine

## 2017-07-22 DIAGNOSIS — J9811 Atelectasis: Secondary | ICD-10-CM | POA: Insufficient documentation

## 2017-07-22 DIAGNOSIS — I2510 Atherosclerotic heart disease of native coronary artery without angina pectoris: Secondary | ICD-10-CM

## 2017-07-22 DIAGNOSIS — Z87891 Personal history of nicotine dependence: Secondary | ICD-10-CM | POA: Insufficient documentation

## 2017-07-22 HISTORY — DX: Atherosclerotic heart disease of native coronary artery without angina pectoris: I25.10

## 2017-07-28 ENCOUNTER — Other Ambulatory Visit: Payer: Self-pay | Admitting: Acute Care

## 2017-07-28 DIAGNOSIS — Z122 Encounter for screening for malignant neoplasm of respiratory organs: Secondary | ICD-10-CM

## 2017-07-28 DIAGNOSIS — Z87891 Personal history of nicotine dependence: Secondary | ICD-10-CM

## 2017-07-28 NOTE — Progress Notes (Signed)
Chest  

## 2017-08-03 ENCOUNTER — Encounter: Payer: Self-pay | Admitting: Family Medicine

## 2017-08-10 ENCOUNTER — Encounter: Payer: Self-pay | Admitting: Pulmonary Disease

## 2017-08-10 ENCOUNTER — Ambulatory Visit (INDEPENDENT_AMBULATORY_CARE_PROVIDER_SITE_OTHER): Payer: Medicare Other | Admitting: Pulmonary Disease

## 2017-08-10 VITALS — BP 118/80 | HR 98 | Ht 62.0 in | Wt 132.4 lb

## 2017-08-10 DIAGNOSIS — I2510 Atherosclerotic heart disease of native coronary artery without angina pectoris: Secondary | ICD-10-CM

## 2017-08-10 DIAGNOSIS — J439 Emphysema, unspecified: Secondary | ICD-10-CM

## 2017-08-10 NOTE — Progress Notes (Signed)
[facility]  Chief Complaint  Patient presents with  . pulm consult    Pt referred by Dr. Brigitte Pulse MD. Pt has increase sinus pressure and allergies in last 3 weeks, dry cough, SOB with exertion.     Vital signs: BP 118/80 (BP Location: Left Arm, Cuff Size: Normal)   Pulse 98   Ht 5\' 2"  (1.575 m)   Wt 132 lb 6.4 oz (60.1 kg)   SpO2 96%   BMI 24.22 kg/m   History of Present Illness: Beth [name] is a 67 y.o. female former smoker with emphysema and right middle lung scarring.  She has 52 pack yr history of smoking.  She was seen by Eric Form for lung cancer screening.  CT chest from 07/21/17 showed emphysema and right middle lung collapse with scarring.  No obvious mass lesion or lymphadenopathy.  She is from [place], but has lived in [place] since 1970.  She had pneumonia in 1994.  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  No history of TB.  She was started on symbicort recently.  This has helped.  Not having cough, wheeze, sputum, chest pain, fever, or hemoptysis.  She is prone to getting allergies in Spring.  She was having sinus congestion and post nasal drip, but improved recently.  Never had allergy testing.  She does okay with activity on level ground, but gets winded when walking up a hill.    Physical Exam:  General - pleasant Eyes - pupils reactive, wears glasses ENT - no sinus tenderness, no oral exudate, no LAN Cardiac - regular, no murmur Chest - no wheeze, rales Abd - soft, non tender Ext - no edema Skin - no rashes Neuro - normal strength Psych - normal mood  Discussion: 67 yo female former smoker with emphysema and Rt middle lung collapse.  She has symptomatic improvement with symbicort.  She reports having pneumonia in 1994.  No obvious adenopathy or obstructive lesion in Rt mid lung field on CT chest.  Assessment/Plan:  Emphysema. - will arrange for PFT - continue symbicort - will get  copy of lab tests from PCP >> will determine if she needs A1AT level checked  Right middle lung scarring and collapse. - f/u CXR at next visit  History of tobacco abuse. - she has f/u CT chest scheduled for November 2019   Patient Instructions  Will schedule pulmonary function test Follow up in 3 months with chest xray    Chesley Mires, MD [place] 08/10/2017, 11:14 AM  Flow Sheet  Pulmonary tests: CT chest 07/21/17 >> atherosclerosis, bullous emphysema, RML partial collapse  Review of Systems: Constitutional: Negative for fever and unexpected weight change.  HENT: Positive for congestion, postnasal drip, sinus pressure and sneezing. Negative for dental problem, ear pain, nosebleeds, rhinorrhea, sore throat and trouble swallowing.   Eyes: Negative for redness and itching.  Respiratory: Positive for cough and shortness of breath. Negative for chest tightness and wheezing.   Cardiovascular: Negative for palpitations and leg swelling.  Gastrointestinal: Negative for nausea and vomiting.  Genitourinary: Negative for dysuria.  Musculoskeletal: Negative for joint swelling.  Skin: Negative for rash.  Allergic/Immunologic: Positive for environmental allergies. Negative for food allergies and immunocompromised state.  Neurological: Negative for headaches.  Psychiatric/Behavioral: Negative for dysphoric mood. The patient is nervous/anxious.    Past Medical History: She  has a past medical history of Age-related osteoporosis with current pathological fracture (03/11/2017), COPD (  chronic obstructive pulmonary disease) ([place]), Coronary atherosclerosis of native coronary artery (07/22/2017), Ear lesion, Hypertension, Kidney stone, and Non-traumatic compression fracture of vertebral column ([place]) (02/18/2017).  Past Surgical History: She  has a past surgical history that includes Tonsillectomy; Eye surgery; Mass excision (Right, 04/12/2017); and Full thickness skin graft (Left,  04/12/2017).  Family History: Her family history includes Arthritis in her sister; Heart attack in her mother; Lung cancer in her father; Stroke in her mother.  Social History: She  reports that she quit smoking about 6 months ago. Her smoking use included cigarettes. She has never used smokeless tobacco. She reports that she does not drink alcohol or use drugs.  Medications: Allergies as of 08/10/2017      Reactions   Cefzil [cefprozil] Other (See Comments)   Burning sensation in lower legs.    Zanaflex [tizanidine Hcl] Nausea And Vomiting      Medication List        Accurate as of 08/10/17 11:14 AM. Always use your most recent med list.          acetaminophen 500 MG tablet Commonly known as:  TYLENOL Take 500 mg every 6 (six) hours as needed by mouth.   albuterol 108 (90 Base) MCG/ACT inhaler Commonly known as:  PROVENTIL HFA;VENTOLIN HFA Inhale 2 puffs into the lungs every 4 (four) hours as needed for wheezing or shortness of breath (cough, shortness of breath or wheezing.).   alendronate 70 MG tablet Commonly known as:  FOSAMAX Take 1 tablet (70 mg total) by mouth once a week. Take with a full glass of water on an empty stomach.   budesonide-formoterol 160-4.5 MCG/ACT inhaler Commonly known as:  SYMBICORT Inhale 2 puffs into the lungs 2 (two) times daily.   diclofenac sodium 1 % Gel Commonly known as:  VOLTAREN   metroNIDAZOLE 1 % gel Commonly known as:  METROGEL Apply topically daily.   Spacer/Aero-Holding [employer] 1 Units by Does not apply route as needed. Dx: J44.9 COPD

## 2017-08-10 NOTE — Patient Instructions (Signed)
Will schedule pulmonary function test Follow up in 3 months with chest xray

## 2017-08-10 NOTE — Progress Notes (Signed)
   Subjective:    Patient ID: Beth [name], female    DOB: 04/03/1950, 67 y.o.   MRN: [id number]  HPI    Review of Systems  Constitutional: Negative for fever and unexpected weight change.  HENT: Positive for congestion, postnasal drip, sinus pressure and sneezing. Negative for dental problem, ear pain, nosebleeds, rhinorrhea, sore throat and trouble swallowing.   Eyes: Negative for redness and itching.  Respiratory: Positive for cough and shortness of breath. Negative for chest tightness and wheezing.   Cardiovascular: Negative for palpitations and leg swelling.  Gastrointestinal: Negative for nausea and vomiting.  Genitourinary: Negative for dysuria.  Musculoskeletal: Negative for joint swelling.  Skin: Negative for rash.  Allergic/Immunologic: Positive for environmental allergies. Negative for food allergies and immunocompromised state.  Neurological: Negative for headaches.  Psychiatric/Behavioral: Negative for dysphoric mood. The patient is nervous/anxious.        Objective:   Physical Exam        Assessment & Plan:

## 2017-08-13 ENCOUNTER — Other Ambulatory Visit: Payer: Self-pay | Admitting: Family Medicine

## 2017-08-13 DIAGNOSIS — N6489 Other specified disorders of breast: Secondary | ICD-10-CM

## 2017-08-17 DIAGNOSIS — L570 Actinic keratosis: Secondary | ICD-10-CM | POA: Diagnosis not present

## 2017-08-17 DIAGNOSIS — L718 Other rosacea: Secondary | ICD-10-CM | POA: Diagnosis not present

## 2017-08-19 ENCOUNTER — Telehealth: Payer: Self-pay | Admitting: Family Medicine

## 2017-09-14 ENCOUNTER — Telehealth: Payer: Self-pay | Admitting: Pulmonary Disease

## 2017-09-14 DIAGNOSIS — J439 Emphysema, unspecified: Secondary | ICD-10-CM

## 2017-09-14 NOTE — Telephone Encounter (Signed)
Received recent lab results from PCP office.  Doesn't appear that she had A1AT level checked previously.    If pt is agreeable then, please place order to check Alpha 1 antitrypsin level.

## 2017-09-14 NOTE — Telephone Encounter (Signed)
Attempted to call patient today regarding results. I did not receive an answer at time of call. I have left a voicemail message for pt to return call. X1  

## 2017-09-15 NOTE — Telephone Encounter (Signed)
Attempted to call patient today regarding results. I did not receive an answer at time of call. I have left a voicemail message for pt to return call. X2  

## 2017-09-16 DIAGNOSIS — L570 Actinic keratosis: Secondary | ICD-10-CM | POA: Diagnosis not present

## 2017-09-16 NOTE — Telephone Encounter (Signed)
Called and spoke to pt and relayed below message.  Pt is agreeable to A1AT. Lab has been ordered. Nothing further is needed.

## 2017-09-23 ENCOUNTER — Ambulatory Visit (INDEPENDENT_AMBULATORY_CARE_PROVIDER_SITE_OTHER): Payer: Medicare Other | Admitting: Otolaryngology

## 2017-09-23 DIAGNOSIS — C44202 Unspecified malignant neoplasm of skin of right ear and external auricular canal: Secondary | ICD-10-CM

## 2017-10-04 ENCOUNTER — Other Ambulatory Visit: Payer: Medicare Other

## 2017-10-04 ENCOUNTER — Other Ambulatory Visit: Payer: Self-pay | Admitting: Family Medicine

## 2017-10-04 ENCOUNTER — Ambulatory Visit
Admission: RE | Admit: 2017-10-04 | Discharge: 2017-10-04 | Disposition: A | Discharge status: Home / Self Care | Payer: Medicare Other | Source: Ambulatory Visit | Attending: Family Medicine | Admitting: Family Medicine

## 2017-10-04 DIAGNOSIS — N6489 Other specified disorders of breast: Secondary | ICD-10-CM

## 2017-10-04 DIAGNOSIS — J439 Emphysema, unspecified: Secondary | ICD-10-CM | POA: Diagnosis not present

## 2017-10-04 DIAGNOSIS — R928 Other abnormal and inconclusive findings on diagnostic imaging of breast: Secondary | ICD-10-CM | POA: Diagnosis not present

## 2017-10-05 ENCOUNTER — Other Ambulatory Visit: Payer: Self-pay | Admitting: Family Medicine

## 2017-10-08 LAB — ALPHA-1 ANTITRYPSIN PHENOTYPE: A-1 Antitrypsin, Ser: 108 mg/dL (ref 83–199)

## 2017-10-12 ENCOUNTER — Telehealth: Payer: Self-pay | Admitting: Pulmonary Disease

## 2017-10-12 NOTE — Telephone Encounter (Signed)
A1AT 10/04/17 >> 108, MS   Please let her know she does not have he inherited form of emphysema.  No change to current tx plan.

## 2017-10-21 NOTE — Telephone Encounter (Signed)
Attempted to call patient today regarding results. I did not receive an answer at time of call. I have left a voicemail message for pt to return call. X1  

## 2017-10-26 NOTE — Telephone Encounter (Signed)
Tried to call pt, no VM available time of call. X2

## 2017-10-27 NOTE — Telephone Encounter (Signed)
Patient returned phone call; pt contact # [phone]

## 2017-10-27 NOTE — Telephone Encounter (Signed)
Called and spoke with Patient.  Dr Juanetta Gosling results and recommendations given. Patient stated understanding.  Nothing further at this time.

## 2017-11-09 ENCOUNTER — Ambulatory Visit (INDEPENDENT_AMBULATORY_CARE_PROVIDER_SITE_OTHER): Payer: Medicare Other | Admitting: Pulmonary Disease

## 2017-11-09 ENCOUNTER — Encounter: Payer: Self-pay | Admitting: Pulmonary Disease

## 2017-11-09 ENCOUNTER — Ambulatory Visit: Payer: Medicare Other | Admitting: Pulmonary Disease

## 2017-11-09 VITALS — BP 150/90 | HR 96 | Ht 61.0 in | Wt 136.0 lb

## 2017-11-09 DIAGNOSIS — J432 Centrilobular emphysema: Secondary | ICD-10-CM | POA: Diagnosis not present

## 2017-11-09 DIAGNOSIS — I2510 Atherosclerotic heart disease of native coronary artery without angina pectoris: Secondary | ICD-10-CM

## 2017-11-09 DIAGNOSIS — J439 Emphysema, unspecified: Secondary | ICD-10-CM

## 2017-11-09 DIAGNOSIS — B370 Candidal stomatitis: Secondary | ICD-10-CM | POA: Diagnosis not present

## 2017-11-09 LAB — PULMONARY FUNCTION TEST
DL/VA % pred: 65 %
DL/VA: 2.89 ml/min/mmHg/L
DLCO unc % pred: 48 %
DLCO unc: 9.82 ml/min/mmHg
FEF 25-75 Post: 0.34 L/sec
FEF 25-75 Pre: 0.27 L/sec
FEF2575-%Change-Post: 26 %
FEF2575-%Pred-Post: 19 %
FEF2575-%Pred-Pre: 15 %
FEV1-%Change-Post: 10 %
FEV1-%Pred-Post: 35 %
FEV1-%Pred-Pre: 32 %
FEV1-Post: 0.71 L
FEV1-Pre: 0.65 L
FEV1FVC-%Change-Post: 2 %
FEV1FVC-%Pred-Pre: 52 %
FEV6-%Change-Post: 10 %
FEV6-%Pred-Post: 66 %
FEV6-%Pred-Pre: 60 %
FEV6-Post: 1.69 L
FEV6-Pre: 1.53 L
FEV6FVC-%Change-Post: 2 %
FEV6FVC-%Pred-Post: 102 %
FEV6FVC-%Pred-Pre: 100 %
FVC-%Change-Post: 7 %
FVC-%Pred-Post: 65 %
FVC-%Pred-Pre: 60 %
FVC-Post: 1.74 L
FVC-Pre: 1.61 L
Post FEV1/FVC ratio: 41 %
Post FEV6/FVC ratio: 97 %
Pre FEV1/FVC ratio: 40 %
Pre FEV6/FVC Ratio: 95 %
RV % pred: 182 %
RV: 3.71 L
TLC % pred: 119 %
TLC: 5.52 L

## 2017-11-09 MED ORDER — NYSTATIN 100000 UNIT/ML MT SUSP: 500000.0000 [IU] | Freq: Four times a day (QID) | OROMUCOSAL | 0 refills | Status: DC

## 2017-11-09 NOTE — Patient Instructions (Signed)
Nystatin 5 ml swish and swallow 4 times per day for 5 days Uses your spacer device when using symbicort Will arrange for referral to pulmonary rehab at [facility] Call with information about the pneumonia shots you have received  Follow up in 6 months

## 2017-11-09 NOTE — Progress Notes (Signed)
[facility]  Chief Complaint  Patient presents with  . Follow-up    Pt is doing well overall, feeling well.     Constitutional: BP (!) 150/90 (BP Location: Left Arm, Cuff Size: Normal)   Pulse 96   Ht 5\' 1"  (1.549 m)   Wt 136 lb (61.7 kg)   SpO2 94%   BMI 25.70 kg/m   History of Present Illness: Beth [name] is a 67 y.o. female former smoker with emphysema and right middle lung scarring.  She had PFT earlier today.  Showed severe obstruction, air trapping, and moderate diffusion defect.  She gets winded when walking fast.  Also has trouble win hot, humid weather.  Uses albuterol few times per week.  Using symbicort bid.  Tries to rinse after each use, but has trouble gargling.  Has occasional cough with clear sputum.  Not having sinus congestion, chest pain, fever, hemoptysis, wheeze, abdominal pain, skin rash, or leg swelling.  Comprehensive Respiratory Exam:  Appearance - well kempt  ENMT - nasal mucosa moist, turbinates clear, midline nasal septum, no dental lesions, no gingival bleeding, white build up on posterior pharynx, no tonsillar hypertrophy Neck - no masses, trachea midline, no thyromegaly, no elevation in JVP Respiratory - normal appearance of chest wall, normal respiratory effort w/o accessory muscle use, no dullness on percussion, no wheezing or rales CV - s1s2 regular rate and rhythm, no murmurs, no peripheral edema, radial pulses symmetric GI - soft, non tender, no masses Lymph - no adenopathy noted in neck and axillary areas MSK - normal muscle strength and tone, normal gait Ext - no cyanosis, clubbing, or joint inflammation noted Skin - no rashes, lesions, or ulcers Neuro - oriented to person, place, and time Psych - normal mood and affect   Assessment/Plan:  COPD with emphysema. - reviewed her PFTs with her - she will get influenza shot with PCP later this week - she had pneumonia shot before >> asked her  to call with information regarding when she got this - continue symbicort and prn albuterol - advised her to rinse mouth after using symbicort and try using spacer device to decrease risk of thrush - will arrange for referral to pulmonary rehab in [place]. - nystatin swish and swallow  History of tobacco abuse. - she has CT chest scheduled for November 2019   Patient Instructions  Nystatin 5 ml swish and swallow 4 times per day for 5 days Uses your spacer device when using symbicort Will arrange for referral to pulmonary rehab at [facility] Call with information about the pneumonia shots you have received  Follow up in 6 months    Chesley Mires, MD [place] 11/09/2017, 11:24 AM  Flow Sheet  Pulmonary tests: CT chest 07/21/17 >> atherosclerosis, bullous emphysema, RML partial collapse PFT 11/09/17 >> FEV1 0.71 (35%), FEV1% 41, TLC 5.52 (119%), DLCO 48%, no BD A1AT 10/04/17 >> 108, MS PFT 11/09/17 >> FEV1 0.71 (35%), FEV1% 41, TLC 5.52 (119%), RV 3.71 (182%), DLCO 48, no BD  Past Medical History: She  has a past medical history of Age-related osteoporosis with current pathological fracture (03/11/2017), COPD (chronic obstructive pulmonary disease) ([place]), Coronary atherosclerosis of native coronary artery (07/22/2017), Ear lesion, Hypertension, Kidney stone, and Non-traumatic compression fracture of vertebral column ([place]) (02/18/2017).  Past Surgical History: She  has a past surgical history that includes Tonsillectomy; Eye surgery; Mass excision (Right, 04/12/2017); and Full thickness skin graft (Left, 04/12/2017).  Family History: Her family history includes Arthritis in her sister; Heart attack in her mother; Lung cancer in her father; Stroke in her mother.  Social History: She  reports that she quit smoking about 9 months ago. Her smoking use included cigarettes. She has never used smokeless tobacco. She reports that she does not drink  alcohol or use drugs.  Medications: Allergies as of 11/09/2017      Reactions   Cefzil [cefprozil] Other (See Comments)   Burning sensation in lower legs.    Zanaflex [tizanidine Hcl] Nausea And Vomiting      Medication List        Accurate as of 11/09/17 11:24 AM. Always use your most recent med list.          acetaminophen 500 MG tablet Commonly known as:  TYLENOL Take 500 mg every 6 (six) hours as needed by mouth.   albuterol 108 (90 Base) MCG/ACT inhaler Commonly known as:  PROVENTIL HFA;VENTOLIN HFA Inhale 2 puffs into the lungs every 4 (four) hours as needed for wheezing or shortness of breath (cough, shortness of breath or wheezing.).   alendronate 70 MG tablet Commonly known as:  FOSAMAX Take 1 tablet (70 mg total) by mouth once a week. Take with a full glass of water on an empty stomach.   budesonide-formoterol 160-4.5 MCG/ACT inhaler Commonly known as:  SYMBICORT Inhale 2 puffs into the lungs 2 (two) times daily.   nystatin 100000 UNIT/ML suspension Commonly known as:  MYCOSTATIN Take 5 mLs (500,000 Units total) by mouth 4 (four) times daily.

## 2017-11-09 NOTE — Progress Notes (Signed)
PFT completed today.  

## 2017-11-10 ENCOUNTER — Telehealth: Payer: Self-pay | Admitting: Pulmonary Disease

## 2017-11-10 ENCOUNTER — Encounter: Payer: Self-pay | Admitting: Pulmonary Disease

## 2017-11-10 ENCOUNTER — Telehealth: Payer: Self-pay | Admitting: Family Medicine

## 2017-11-10 NOTE — Telephone Encounter (Signed)
**  Patient needs to reschedule their apt on 11/18/17 with a different provider or get an appointment with Dr Brigitte Pulse when she comes back in Oct. If she only has same day slots please call the clinic and ask for [facility] so I can open a slot for you.**CC

## 2017-11-10 NOTE — Telephone Encounter (Signed)
Spoke with patient. She stated that she was advised to call back once she knew exactly which PNA vaccine she had. She also asked that the kidney stone be removed from her past medical history. She stated that she was evaluated for a possible kidney stone but it was never confirmed. Removed the kidney stone from her chart.   The PNA vaccine she had was the pneumococcal 23 in 2017. Will add to patient's chart.   Nothing further needed at time of call.

## 2017-11-11 ENCOUNTER — Ambulatory Visit: Payer: Medicare Other | Admitting: Family Medicine

## 2017-11-12 DIAGNOSIS — L570 Actinic keratosis: Secondary | ICD-10-CM | POA: Diagnosis not present

## 2017-11-15 ENCOUNTER — Telehealth: Payer: Self-pay | Admitting: Pulmonary Disease

## 2017-11-15 NOTE — Telephone Encounter (Signed)
Called and spoke with Patient.  Patient stated that she was to start Nystatin 67ml 4 times per day for 5 days.  She stated that she started 11/11/17 and finished it 11/14/17.  She has no complaints or symptoms of thrush at this time.  Reminded her to rinse her mouth good after using inhalers.  She stated that she is using her spacer with Symbicort again.  Explained symptoms of thrush and to call us if she has any problems.  Patient stated understanding.  Nothing further at this time.

## 2017-11-15 NOTE — Telephone Encounter (Signed)
Patient took last dose yesterday.

## 2017-11-18 ENCOUNTER — Ambulatory Visit: Payer: Medicare Other | Admitting: Family Medicine

## 2017-12-09 DIAGNOSIS — Z23 Encounter for immunization: Secondary | ICD-10-CM | POA: Diagnosis not present

## 2017-12-28 ENCOUNTER — Ambulatory Visit: Payer: Medicare Other | Admitting: Family Medicine

## 2018-01-03 ENCOUNTER — Ambulatory Visit (INDEPENDENT_AMBULATORY_CARE_PROVIDER_SITE_OTHER): Payer: Medicare Other | Admitting: Otolaryngology

## 2018-01-03 DIAGNOSIS — C44222 Squamous cell carcinoma of skin of right ear and external auricular canal: Secondary | ICD-10-CM

## 2018-01-05 ENCOUNTER — Encounter: Payer: Self-pay | Admitting: Acute Care

## 2018-01-23 ENCOUNTER — Encounter: Payer: Self-pay | Admitting: Family Medicine

## 2018-01-23 DIAGNOSIS — R928 Other abnormal and inconclusive findings on diagnostic imaging of breast: Secondary | ICD-10-CM | POA: Insufficient documentation

## 2018-01-28 ENCOUNTER — Encounter: Payer: Self-pay | Admitting: Family Medicine

## 2018-01-28 ENCOUNTER — Ambulatory Visit (INDEPENDENT_AMBULATORY_CARE_PROVIDER_SITE_OTHER): Payer: Medicare Other | Admitting: Family Medicine

## 2018-01-28 ENCOUNTER — Other Ambulatory Visit: Payer: Self-pay

## 2018-01-28 VITALS — BP 150/82 | HR 65 | Temp 98.0°F | Resp 16 | Ht 62.21 in | Wt 135.0 lb

## 2018-01-28 DIAGNOSIS — B852 Pediculosis, unspecified: Secondary | ICD-10-CM | POA: Diagnosis not present

## 2018-01-28 DIAGNOSIS — I2510 Atherosclerotic heart disease of native coronary artery without angina pectoris: Secondary | ICD-10-CM | POA: Diagnosis not present

## 2018-01-28 DIAGNOSIS — B850 Pediculosis due to Pediculus humanus capitis: Secondary | ICD-10-CM

## 2018-01-28 DIAGNOSIS — Z0000 Encounter for general adult medical examination without abnormal findings: Secondary | ICD-10-CM

## 2018-01-28 DIAGNOSIS — Z1383 Encounter for screening for respiratory disorder NEC: Secondary | ICD-10-CM | POA: Diagnosis not present

## 2018-01-28 DIAGNOSIS — R030 Elevated blood-pressure reading, without diagnosis of hypertension: Secondary | ICD-10-CM

## 2018-01-28 DIAGNOSIS — Z1389 Encounter for screening for other disorder: Secondary | ICD-10-CM

## 2018-01-28 DIAGNOSIS — J449 Chronic obstructive pulmonary disease, unspecified: Secondary | ICD-10-CM

## 2018-01-28 DIAGNOSIS — Z23 Encounter for immunization: Secondary | ICD-10-CM

## 2018-01-28 DIAGNOSIS — Z87891 Personal history of nicotine dependence: Secondary | ICD-10-CM | POA: Diagnosis not present

## 2018-01-28 DIAGNOSIS — Z136 Encounter for screening for cardiovascular disorders: Secondary | ICD-10-CM

## 2018-01-28 DIAGNOSIS — M8000XD Age-related osteoporosis with current pathological fracture, unspecified site, subsequent encounter for fracture with routine healing: Secondary | ICD-10-CM | POA: Diagnosis not present

## 2018-01-28 DIAGNOSIS — Z1211 Encounter for screening for malignant neoplasm of colon: Secondary | ICD-10-CM | POA: Diagnosis not present

## 2018-01-28 MED ORDER — PERMETHRIN 1 % EX LIQD: Freq: Once | CUTANEOUS | 3 refills | Status: AC

## 2018-01-28 MED ORDER — ALBUTEROL SULFATE HFA 108 (90 BASE) MCG/ACT IN AERS: 2.0000 | INHALATION_SPRAY | RESPIRATORY_TRACT | 11 refills | Status: DC | PRN

## 2018-01-28 MED ORDER — ALENDRONATE SODIUM 70 MG PO TABS: 70.0000 mg | ORAL_TABLET | ORAL | 3 refills | Status: DC

## 2018-01-28 MED ORDER — BUDESONIDE-FORMOTEROL FUMARATE 160-4.5 MCG/ACT IN AERO: 2.0000 | INHALATION_SPRAY | Freq: Two times a day (BID) | RESPIRATORY_TRACT | 11 refills | Status: DC

## 2018-01-28 NOTE — Patient Instructions (Addendum)
If you have lab work done today you will be contacted with your lab results within the next 2 weeks.  If you have not heard from [place] then please contact us. The fastest way to get your results is to register for My Chart.   IF you received an x-ray today, you will receive an invoice from [facility]. Please contact [facility] at [phone] with questions or concerns regarding your invoice.   IF you received labwork today, you will receive an invoice from [place]. Please contact [facility] at [phone] with questions or concerns regarding your invoice.   Our billing staff will not be able to assist you with questions regarding bills from these companies.  You will be contacted with the lab results as soon as they are available. The fastest way to get your results is to activate your My Chart account. Instructions are located on the last page of this paperwork. If you have not heard from [place] regarding the results in 2 weeks, please contact this office.      Lice, Adult Lice are tiny insects with claws on the ends of their legs. They are small parasites that live on the human body. A parasite is an insect that lives off another animal and cannot survive without it. Lice often make their home in a person's hair, such as hair on the head or in the pubic area. Pubic lice are sometimes referred to as crabs. Lice hatch from little round eggs, which are attached to the base of hairs. Lice eggs are also called nits. Lice can spread from one person to another. Lice crawl. They do not fly or jump. Lice cause skin irritation and itching in the area of the infested hair. Although having lice can be annoying, it is not dangerous. Lice do not spread diseases. Treatment will usually clear up the symptoms within a few days. What are the causes? This condition may be caused by:  Having very close contact with an infested person.  Sharing infested items that touch your skin and hair.  These include personal items, such as hats, combs, brushes, towels, clothing, pillowcases, or sheets.  Pubic lice are spread through sexual contact. What increases the risk? Although having lice is more common among young children, anyone can get lice. Lice tend to thrive in warm weather, so that type of weather increases the risk. What are the signs or symptoms? Symptoms of this condition include:  Itchiness in the affected area.  Skin irritation.  Feeling of something moving in the hair.  Rash or sores on the skin.  Tiny flakes or sacs near the scalp. These may be white, yellow, or tan.  Tiny bugs crawling on the hair or scalp.  How is this diagnosed? This condition is diagnosed based on:  Your symptoms.  A physical exam. ? Your health care provider will examine the affected area closely for live lice, tiny eggs (nits), and empty egg cases. ? Eggs are typically yellow or tan in color. Empty egg cases are whitish. Lice are gray or brown.  How is this treated? Treatment for this condition includes:  Using a hair rinse that contains a mild insecticide to kill lice. Your health care provider will recommend a prescription or over-the-counter rinse.  Removing lice, eggs, and egg cases by using a comb or tweezers.  Washing and bagging your clothing and bedding.  Pregnant women should not use medicated shampoo or cream without first talking to their health care provider. Follow  these instructions at home: Using medicated rinse Apply medicated rinse as told by your health care provider. Follow the label instructions carefully. General instructions for applying rinses may include these steps: 1. Put on an old shirt or underwear, or use an old towel in case of staining from the rinse. 2. Wash and towel-dry your head or pubic area before applying the rinse if directed to do so. 3. When your hair is dry, apply the rinse. Leave the rinse in your hair for the amount of time specified  in the instructions. 4. Rinse the area with water. 5. Comb your wet hair with a fine-tooth comb. Comb it close to the skin and down to the ends, removing any lice, eggs, or egg cases. A lice comb may be included with the medicated rinse. 6. Do not wash the infested hair for 2 days while the medicine kills the lice. 7. After the treatment, repeat combing out your hair and removing lice, eggs, or egg cases from the hair every 2-3 days. Do this for about 2-3 weeks. After treatment, the remaining lice should be moving more slowly. 8. Repeat the treatment if necessary in 7-10 days.  General instructions  Remove any remaining lice, eggs, or egg cases using a fine-tooth comb.  Use hot water to wash all towels, hats, scarves, jackets, bedding, and clothing that you have recently used.  Put any non-washable items that may have been exposed into plastic bags. Keep the bags closed for 2 weeks.  Soak all combs and brushes in hot water for 10 minutes.  Vacuum furniture to remove any loose hair. There is no need to use chemicals, which can be poisonous (toxic). Lice survive for only 1-2 days away from human skin. Eggs may survive for only 1 week.  For pubic lice, tell any sexual partners to seek treatment.  For head lice, ask your health care provider if other family members or close contacts should be examined or treated as well.  Keep all follow-up visits as told by your health care provider. This is important. Contact a health care provider if:  You develop sores that look infected.  Your rash or sores do not go away in 1 week.  The lice or eggs return or do not go away in spite of treatment. Summary  Lice are tiny parasitic insects that live on the human body. A parasite is an insect that lives off another animal and cannot survive without it.  Lice can spread from one person to another through close contact with an infested person or by sharing personal items, such as combs, brushes, or  hats.  Lice can be treated with a medicated rinse. Follow your health care provider's instructions, or instructions on the label, if you are being treated with this medicine.  Ask your health care provider if your family members or close contacts should be treated for lice. This information is not intended to replace advice given to you by your health care provider. Make sure you discuss any questions you have with your health care provider. Document Released: 02/23/2005 Document Revised: 03/04/2016 Document Reviewed: 03/04/2016 Elsevier Interactive Patient Education  2017 [employer].

## 2018-01-28 NOTE — Progress Notes (Signed)
Chief Complaint  Patient presents with  . Medicare Wellness    Subjective:   Beth [name] is a 67 y.o. female who presents for Medicare Annual (Subsequent) preventive examination.  Review of Systems:  Scalp became very itchy 2 weeks ago - 24/7 - may have an intermittent rash at the bottom her hair - trial of ketoconazole 2% shampoo and scaplicin later in the day x 2 weeks w/o any improvement    HTN Takes Lisinopril-HCTZ 10-12.5 started 6 months prior. - Pt has been checking her BP daily outside of the office. Reports systolic readings ranging 98-110, with the highest being 127 a few wks ago. She states that she did not take her BP medication for an entire wk with systolic BP readings ranging 108-113. Denies lightheadedness, dizziness.  Osteoporosis Started fosamax 6 months prior. Bone density done 4 months prior with T score -3.1 in R femur. Has had vertebral compression fractures, using Tylenol several times/day in addition to occasional narcotics. - States Dr. Sherlyn Lick with [facility] in [facility] had her stop her fosamax while she started on Calcitonin nasal spray. She has completed nasal spray now. Denies side-effects from fosamax. She is only taking Tramadol once-twice/wk for back pain which does not limit her from activities. Has not used diclofenac gel for several days which she was using for dull, aching R hip pain.  COPD Prn albuterol. - Pt has been using inhaler 3-4 times/day, depending on the weather and her activity. She feels like she is not getting the medication into her lungs. Pt requests a spacer at this visit. Tried to by one otc at [facility] but was told she needs a rx.  Right [place] Skin cancer Pt had an ulcerated skin nodule removed from her R ear requiring skin grafting on 2/4 by ENT Dr. Benjamine Mola. Very suspicious for basal cell but pathology was neg for any tumor. - Last visit was ~3 wks ago; pt states that she has a f/u in June since the reports showed cancer.  She has noted some tenderness that she attributes to the healing process. Pt has applied Aveeno to the area.  Nose Pt presents with a red area to the R side of her nose. She had the area frozen off which caused a scab for a month that fell off and returned to the same state within 1 wk. She reports that the area is still tender to touch and has gradually enlarged in size over the past yr. Pt has applied neosporin without relief.  H/o Tobacco Use Quit 01/2017. Pt started smoking at age 68 up to 1 ppd. Going to [facility] for f/u CT scan  2 weeks of scalp itching Dr. Veleta Miners derm on 12/5 as 50/50 t    Objective:     Vitals: BP (!) 150/82   Pulse 65   Temp 98 F (36.7 C) (Other (Comment))   Resp 16   Ht 5' 2.21" (1.58 m)   Wt 135 lb (61.2 kg)   SpO2 96%   BMI 24.53 kg/m   Body mass index is 24.53 kg/m.  Advanced Directives 01/28/2018 04/05/2017 02/23/2015 02/23/2015  Does Patient Have a Medical Advance Directive? Yes No No No  Type of Advance Directive [place]  Does patient want to make changes to medical advance directive? No - Patient declined - - -  Copy of [place] in Chart? No - copy requested - - -  Would patient like information on  creating a medical advance directive? - No - Patient declined - -    Tobacco Social History   Tobacco Use  Smoking Status Former Smoker  . Types: Cigarettes  . Last attempt to quit: 02/03/2017  . Years since quitting: 0.9  Smokeless Tobacco Never Used     Counseling given: Not Answered   Clinical Intake:                       Past Medical History:  Diagnosis Date  . Age-related osteoporosis with current pathological fracture 03/11/2017   [facility] bone [facility] at [facility] 03/29/2017 T score -3.1 at Rt total femur  . COPD (chronic obstructive pulmonary disease) ([place])   . Coronary atherosclerosis of native coronary artery 07/22/2017   Seen on chest Ct 07/21/17. Aortic and branch  vessel atherosclerosis. Normal heart size, without pericardial effusion. Multivessel coronary artery atherosclerosis.  . Hypertension   . Non-traumatic compression fracture of vertebral column (H[facility]) 02/18/2017   Chest CT 07/21/17: Moderate T5 and moderate to severe T6 compression deformities w/o significant canal encroachment. Moderate T8 compression deformity is also w/o canal encroachment  . 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 Benjamine Mola 04/2017   Past Surgical History:  Procedure Laterality Date  . EYE SURGERY    . MASS EXCISION Right 04/12/2017   Procedure: EXCISION OF RIGHT EAR  MASS;  Surgeon: Leta Baptist, MD;  Location: [place];  Service: ENT;  Laterality: Right; squamous cell cancer with clear margins  . SKIN FULL THICKNESS GRAFT Left 04/12/2017   Procedure: SKIN GRAFT FULL THICKNESS FROM ABDOMEN TO RIGHT EAR;  Surgeon: Leta Baptist, MD;  Location: [place];  Service: ENT;  Laterality: Right  . TONSILLECTOMY     Family History  Problem Relation Age of Onset  . Heart attack Mother   . Stroke Mother   . Lung cancer Father   . Arthritis Sister    Social History   Socioeconomic History  . Marital status: Married    Spouse name: Not on file  . Number of children: Not on file  . Years of education: Not on file  . Highest education level: Not on file  Occupational History  . Not on file  Social Needs  . Financial resource strain: Not on file  . Food insecurity:    Worry: Not on file    Inability: Not on file  . Transportation needs:    Medical: Not on file    Non-medical: Not on file  Tobacco Use  . Smoking status: Former Smoker    Types: Cigarettes    Last attempt to quit: 02/03/2017    Years since quitting: 0.9  . Smokeless tobacco: Never Used  Substance and Sexual Activity  . Alcohol use: No  . Drug use: No  . Sexual activity: Not on file  Lifestyle  . Physical activity:    Days per week: Not on file     Minutes per session: Not on file  . Stress: Not on file  Relationships  . Social connections:    Talks on phone: Not on file    Gets together: Not on file    Attends religious service: Not on file    Active member of club or organization: Not on file    Attends meetings of clubs or organizations: Not on file    Relationship status: Not on file  Other Topics Concern  . Not  on file  Social History Narrative  . Not on file    Outpatient Encounter Medications as of 01/28/2018  Medication Sig  . acetaminophen (TYLENOL) 500 MG tablet Take 500 mg every 6 (six) hours as needed by mouth.  Marland Kitchen albuterol (PROVENTIL HFA;VENTOLIN HFA) 108 (90 Base) MCG/ACT inhaler Inhale 2 puffs into the lungs every 4 (four) hours as needed for wheezing or shortness of breath (cough, shortness of breath or wheezing.).  Marland Kitchen alendronate (FOSAMAX) 70 MG tablet Take 1 tablet (70 mg total) by mouth once a week. Take with a full glass of water on an empty stomach.  . budesonide-formoterol (SYMBICORT) 160-4.5 MCG/ACT inhaler Inhale 2 puffs into the lungs 2 (two) times daily.  . [DISCONTINUED] nystatin (MYCOSTATIN) 100000 UNIT/ML suspension Take 5 mLs (500,000 Units total) by mouth 4 (four) times daily.   No facility-administered encounter medications on file as of 01/28/2018.     Activities of Daily Living In your present state of health, do you have any difficulty performing the following activities: 01/28/2018  Hearing? N  Vision? N  Difficulty concentrating or making decisions? N  Walking or climbing stairs? N  Dressing or bathing? N  Doing errands, shopping? Y  Some recent data might be hidden    Patient Care Team: Shawnee Knapp, MD as PCP - General (Family Medicine) Leta Baptist, MD as Consulting Physician (Otolaryngology) Murlean Iba, MD as Referring Physician (Orthopedic Surgery) Chesley Mires, MD as Consulting Physician (Pulmonary Disease)    Assessment:   This is a routine wellness examination  for [place].  Exercise Activities and Dietary recommendations    Goals   None     Fall Risk Fall Risk  07/10/2017 03/08/2017 02/22/2017 02/18/2017 01/25/2017  Falls in the past year? No No No No No   Is the patient's home free of loose throw rugs in walkways, pet beds, electrical cords, etc?   yes      Grab bars in the bathroom? yes      Handrails on the stairs?   yes      Adequate lighting?   yes  Timed Get Up and Go performed: Normal  Depression Screen PHQ 2/9 Scores 07/10/2017 03/08/2017 02/22/2017 02/18/2017  PHQ - 2 Score 0 0 0 0     Cognitive Function     6CIT Screen 01/28/2018  What Year? 0 points  What month? 0 points  What time? 0 points  Count back from 20 0 points  Months in reverse 0 points  Repeat phrase 0 points  Total Score 0    Immunization History  Administered Date(s) Administered  . Influenza, High Dose Seasonal PF 12/09/2017  . Influenza,inj,Quad PF,6+ Mos 03/08/2017  . Pneumococcal Polysaccharide-23 03/10/2015    Qualifies for Shingles Vaccine?Yes  Screening Tests Health Maintenance  Topic Date Due  . TETANUS/TDAP  05/27/1967  . COLONOSCOPY  05/27/1998  . PNA vac Low Risk Adult (2 of 2 - PCV13) 03/09/2016  . MAMMOGRAM  04/03/2019  . INFLUENZA VACCINE  Completed  . DEXA SCAN  Completed  . Hepatitis C Screening  Completed    Cancer Screenings: Lung: Low Dose CT Chest recommended if Age 44-80 years, 30 pack-year currently smoking OR have quit w/in 15years. Patient does not qualify. Breast:  Up to date on Mammogram? Yes  - scheduled for 04/11/2018 Up to date of Bone Density/Dexa? Yes - done 03/29/2017 Colorectal: pt elects to do cologuard  Additional Screenings:: Hepatitis C Screening:  done 02/23/15     Plan:  1. Encounter for Medicare annual wellness exam   2. Screening for colon cancer   3. Lice infested hair   4. History of tobacco use   5. Age-related osteoporosis with current pathological fracture with routine healing,  subsequent encounter   6. Chronic obstructive pulmonary disease, unspecified COPD type ([place])   7. Screening for cardiovascular, respiratory, and genitourinary diseases   8. Atherosclerosis of native coronary artery of native heart without angina pectoris   9. White coat syndrome with high blood pressure but without hypertension     Orders Placed This Encounter  Procedures  . Pneumococcal conjugate vaccine 13-valent IM  . Cologuard  . Lipid panel    Standing Status:   Future    Standing Expiration Date:   02/07/2019    Order Specific Question:   Has the patient fasted?    Answer:   Yes    Meds ordered this encounter  Medications  . budesonide-formoterol (SYMBICORT) 160-4.5 MCG/ACT inhaler    Sig: Inhale 2 puffs into the lungs 2 (two) times daily.    Dispense:  1 Inhaler    Refill:  11  . albuterol (PROVENTIL HFA;VENTOLIN HFA) 108 (90 Base) MCG/ACT inhaler    Sig: Inhale 2 puffs into the lungs every 4 (four) hours as needed for wheezing or shortness of breath (cough, shortness of breath or wheezing.).    Dispense:  1 Inhaler    Refill:  11  . permethrin (NIX) 1 % liquid    Sig: Apply topically once for 1 dose.    Dispense:  120 mL    Refill:  3  . alendronate (FOSAMAX) 70 MG tablet    Sig: Take 1 tablet (70 mg total) by mouth once a week. Take with a full glass of water on an empty stomach.    Dispense:  12 tablet    Refill:  3    I have personally reviewed and noted the following in the patient's chart:   . Medical and social history . Use of alcohol, tobacco or illicit drugs  . Current medications and supplements . Functional ability and status . Nutritional status . Physical activity . Advanced directives . List of other physicians . Hospitalizations, surgeries, and ER visits in previous 12 months . Vitals . Screenings to include cognitive, depression, and falls . Referrals and appointments  In addition, I have reviewed and discussed with patient certain  preventive protocols, quality metrics, and best practice recommendations. A written personalized care plan for preventive services as well as general preventive health recommendations were provided to patient.     Shawnee Knapp, MD  01/28/2018   Delman Cheadle, MD, MPH [facility] [address] [place], [place]  [address] [phone] Office phone  [phone] Office fax   02/06/18 4:50 AM

## 2018-02-01 ENCOUNTER — Ambulatory Visit (HOSPITAL_COMMUNITY)
Admission: RE | Admit: 2018-02-01 | Discharge: 2018-02-01 | Disposition: A | Discharge status: Home / Self Care | Payer: Medicare Other | Source: Ambulatory Visit | Attending: Acute Care | Admitting: Acute Care

## 2018-02-01 DIAGNOSIS — C3490 Malignant neoplasm of unspecified part of unspecified bronchus or lung: Secondary | ICD-10-CM | POA: Diagnosis not present

## 2018-02-01 DIAGNOSIS — Z87891 Personal history of nicotine dependence: Secondary | ICD-10-CM | POA: Diagnosis not present

## 2018-02-01 DIAGNOSIS — Z122 Encounter for screening for malignant neoplasm of respiratory organs: Secondary | ICD-10-CM | POA: Insufficient documentation

## 2018-02-10 DIAGNOSIS — L905 Scar conditions and fibrosis of skin: Secondary | ICD-10-CM | POA: Diagnosis not present

## 2018-02-10 DIAGNOSIS — Z85828 Personal history of other malignant neoplasm of skin: Secondary | ICD-10-CM | POA: Diagnosis not present

## 2018-02-12 ENCOUNTER — Ambulatory Visit (HOSPITAL_COMMUNITY)
Admission: EM | Admit: 2018-02-12 | Discharge: 2018-02-12 | Disposition: A | Discharge status: Home / Self Care | Payer: Medicare Other | Attending: Internal Medicine | Admitting: Internal Medicine

## 2018-02-12 ENCOUNTER — Ambulatory Visit (INDEPENDENT_AMBULATORY_CARE_PROVIDER_SITE_OTHER): Payer: Medicare Other

## 2018-02-12 ENCOUNTER — Encounter (HOSPITAL_COMMUNITY): Payer: Self-pay

## 2018-02-12 DIAGNOSIS — R059 Cough, unspecified: Secondary | ICD-10-CM

## 2018-02-12 DIAGNOSIS — R05 Cough: Secondary | ICD-10-CM

## 2018-02-12 DIAGNOSIS — J449 Chronic obstructive pulmonary disease, unspecified: Secondary | ICD-10-CM

## 2018-02-12 MED ORDER — PREDNISONE 10 MG PO TABS: 40.0000 mg | ORAL_TABLET | Freq: Every day | ORAL | 0 refills | Status: AC

## 2018-02-12 MED ORDER — ALBUTEROL SULFATE (2.5 MG/3ML) 0.083% IN NEBU
5.0000 mg | INHALATION_SOLUTION | Freq: Once | RESPIRATORY_TRACT | Status: AC
Start: 2018-02-12 — End: 2018-02-12
  Administered 2018-02-12: 5 mg via RESPIRATORY_TRACT

## 2018-02-12 MED ORDER — BENZONATATE 100 MG PO CAPS: 100.0000 mg | ORAL_CAPSULE | Freq: Three times a day (TID) | ORAL | 0 refills | Status: DC

## 2018-02-12 MED ORDER — ALBUTEROL SULFATE (2.5 MG/3ML) 0.083% IN NEBU
INHALATION_SOLUTION | RESPIRATORY_TRACT | Status: AC
  Filled 2018-02-12: qty 3

## 2018-02-12 NOTE — ED Provider Notes (Signed)
[place]    CSN: [id number] Arrival date & time: 02/12/18  1038     History   Chief Complaint Chief Complaint  Patient presents with  . Cough  . Chest Pain    HPI Omega Durante is a 67 y.o. female.   67 year old female with history of COPD presents with lower right flank pain x2 days.  Patient states that the pain is associated with her coughing from COPD.  Patient describes the condition is being acute in nature.  Condition is made worse with movement and coughing.  Condition is made better by nothing.  Patient denies any relief from prior treatments prior to arrival at this facility.  She denies any fevers or chest pain.  It is nonproductive in nature with no symptoms of upper respiratory infection     Past Medical History:  Diagnosis Date  . Age-related osteoporosis with current pathological fracture 03/11/2017   DEXA bone scan at [facility] 03/29/2017 T score -3.1 at Rt total femur  . COPD (chronic obstructive pulmonary disease) ([place])   . Coronary atherosclerosis of native coronary artery 07/22/2017   Seen on chest Ct 07/21/17. Aortic and branch vessel atherosclerosis. Normal heart size, without pericardial effusion. Multivessel coronary artery atherosclerosis.  . Hypertension   . Non-traumatic compression fracture of vertebral column (HCC) 02/18/2017   Chest CT 07/21/17: Moderate T5 and moderate to severe T6 compression deformities w/o significant canal encroachment. Moderate T8 compression deformity is also w/o canal encroachment  . 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 Benjamine Mola 04/2017    Patient Active Problem List   Diagnosis Date Noted  . Abnormality of left breast on screening mammogram 01/23/2018  . History of tobacco use 07/22/2017  . Coronary atherosclerosis of native coronary artery 07/22/2017  . Collapse of right lung 07/22/2017  . Age-related osteoporosis with current pathological fracture 03/11/2017  .  Chronic obstructive pulmonary disease ([place]) 03/11/2017  . Acute bilateral thoracic back pain 03/11/2017  . White coat syndrome with high blood pressure but without hypertension 03/11/2017    Past Surgical History:  Procedure Laterality Date  . EYE SURGERY    . MASS EXCISION Right 04/12/2017   Procedure: EXCISION OF RIGHT EAR  MASS;  Surgeon: Leta Baptist, MD;  Location: [place];  Service: ENT;  Laterality: Right; squamous cell cancer with clear margins  . SKIN FULL THICKNESS GRAFT Left 04/12/2017   Procedure: SKIN GRAFT FULL THICKNESS FROM ABDOMEN TO RIGHT EAR;  Surgeon: Leta Baptist, MD;  Location: [place];  Service: ENT;  Laterality: Right  . TONSILLECTOMY      OB History   None      Home Medications    Prior to Admission medications   Medication Sig Start Date End Date Taking? Authorizing Provider  acetaminophen (TYLENOL) 500 MG tablet Take 500 mg every 6 (six) hours as needed by mouth.    [provider]  albuterol (PROVENTIL HFA;VENTOLIN HFA) 108 (90 Base) MCG/ACT inhaler Inhale 2 puffs into the lungs every 4 (four) hours as needed for wheezing or shortness of breath (cough, shortness of breath or wheezing.). 01/28/18   Shawnee Knapp, MD  alendronate (FOSAMAX) 70 MG tablet Take 1 tablet (70 mg total) by mouth once a week. Take with a full glass of water on an empty stomach. 01/28/18   Shawnee Knapp, MD  budesonide-formoterol [facility]) 160-4.5 MCG/ACT inhaler Inhale 2 puffs into the lungs 2 (two) times daily.  01/28/18   Shawnee Knapp, MD    Family History Family History  Problem Relation Age of Onset  . Heart attack Mother   . Stroke Mother   . Lung cancer Father   . Arthritis Sister     Social History Social History   Tobacco Use  . Smoking status: Former Smoker    Types: Cigarettes    Last attempt to quit: 02/03/2017    Years since quitting: 1.0  . Smokeless tobacco: Never Used  Substance Use Topics  . Alcohol use: No  . Drug use:  No     Allergies   Cefzil [cefprozil] and Zanaflex [tizanidine hcl]   Review of Systems Review of Systems  Constitutional: Negative for chills and fever.  HENT: Negative for ear pain and sore throat.   Eyes: Negative for pain and visual disturbance.  Respiratory: Positive for cough. Negative for shortness of breath.   Cardiovascular: Negative for chest pain and palpitations.  Gastrointestinal: Negative for abdominal pain and vomiting.  Genitourinary: Negative for dysuria and hematuria.  Musculoskeletal: Positive for myalgias (right flank). Negative for arthralgias and back pain.  Skin: Negative for color change and rash.  Neurological: Negative for seizures and syncope.  All other systems reviewed and are negative.    Physical Exam Triage Vital Signs ED Triage Vitals  Enc Vitals Group     BP 02/12/18 1135 125/85     Pulse Rate 02/12/18 1135 100     Resp 02/12/18 1135 18     Temp 02/12/18 1135 98 F (36.7 C)     Temp Source 02/12/18 1135 Oral     SpO2 02/12/18 1135 99 %     Weight --      Height --      Head Circumference --      Peak Flow --      Pain Score 02/12/18 1137 8     Pain Loc --      Pain Edu? --      Excl. in [place]? --    No data found.  Updated Vital Signs BP 125/85 (BP Location: Right Arm)   Pulse 100   Temp 98 F (36.7 C) (Oral)   Resp 18   SpO2 99%   Visual Acuity Right Eye Distance:   Left Eye Distance:   Bilateral Distance:    Right Eye Near:   Left Eye Near:    Bilateral Near:     Physical Exam  Constitutional: She is oriented to person, place, and time. She appears well-developed and well-nourished.  HENT:  Head: Normocephalic and atraumatic.  Eyes: Conjunctivae are normal.  Neck: Normal range of motion.  Cardiovascular: Normal rate and regular rhythm.  Pulmonary/Chest: Effort normal. She has decreased breath sounds.  diminished breath sounds throughout  Musculoskeletal:  Tenderness palpation to right lower flank above.    Neurological: She is alert and oriented to person, place, and time.  Psychiatric: She has a normal mood and affect.  Nursing note and vitals reviewed.    UC Treatments / Results  Labs (all labs ordered are listed, but only abnormal results are displayed) Labs Reviewed - No data to display  EKG None Radiology No results found.  Procedures Procedures (including critical care time)  Medications Ordered in UC Medications  albuterol (PROVENTIL) (2.5 MG/3ML) 0.083% nebulizer solution 5 mg (has no administration in time range)    Initial Impression / Assessment and Plan / UC Course  I have reviewed the triage vital signs and the  nursing notes.  Pertinent labs & imaging results that were available during my care of the patient were reviewed by me and considered in my medical decision making (see chart for details).   Breathing improved with nebulizer treatment     Final Clinical Impressions(s) / UC Diagnoses   Final diagnoses:  None   Discharge Instructions   None    ED Prescriptions    None     Controlled Substance Prescriptions [place] Controlled Substance Registry consulted? Not Applicable   Jacqualine Mau, NP 02/12/18 1239

## 2018-02-12 NOTE — ED Triage Notes (Signed)
Pt present rib pain from coughing hard.  Pt states that she has not injured herself the pain comes from coughing hard.

## 2018-02-12 NOTE — Discharge Instructions (Signed)
Support sides when coughing whenever possible

## 2018-02-15 ENCOUNTER — Encounter: Payer: Self-pay | Admitting: *Deleted

## 2018-02-15 ENCOUNTER — Other Ambulatory Visit: Payer: Self-pay | Admitting: Acute Care

## 2018-02-15 DIAGNOSIS — Z87891 Personal history of nicotine dependence: Secondary | ICD-10-CM

## 2018-02-15 DIAGNOSIS — Z122 Encounter for screening for malignant neoplasm of respiratory organs: Secondary | ICD-10-CM

## 2018-02-24 ENCOUNTER — Encounter: Payer: Self-pay | Admitting: Family Medicine

## 2018-02-24 NOTE — Progress Notes (Unsigned)
The patient is considered osteoporotic. Routine follow up every 2 years; See scan document for detailed information

## 2018-03-01 LAB — COLOGUARD: Cologuard: NEGATIVE

## 2018-04-11 ENCOUNTER — Ambulatory Visit
Admission: RE | Admit: 2018-04-11 | Discharge: 2018-04-11 | Disposition: A | Discharge status: Home / Self Care | Payer: Medicare HMO | Source: Ambulatory Visit | Attending: Family Medicine | Admitting: Family Medicine

## 2018-04-11 ENCOUNTER — Ambulatory Visit
Admission: RE | Admit: 2018-04-11 | Discharge: 2018-04-11 | Disposition: A | Discharge status: Home / Self Care | Payer: Medicare Other | Source: Ambulatory Visit | Attending: Family Medicine | Admitting: Family Medicine

## 2018-04-11 DIAGNOSIS — N6489 Other specified disorders of breast: Secondary | ICD-10-CM

## 2018-04-11 DIAGNOSIS — R928 Other abnormal and inconclusive findings on diagnostic imaging of breast: Secondary | ICD-10-CM | POA: Diagnosis not present

## 2018-05-10 ENCOUNTER — Telehealth: Payer: Self-pay | Admitting: Family Medicine

## 2018-05-10 NOTE — Telephone Encounter (Signed)
mychart message sent to pt about their appointment with Dr Shaw °

## 2018-05-17 ENCOUNTER — Ambulatory Visit (HOSPITAL_COMMUNITY)
Admission: EM | Admit: 2018-05-17 | Discharge: 2018-05-17 | Disposition: A | Discharge status: Home / Self Care | Payer: Medicare HMO | Attending: Family Medicine | Admitting: Family Medicine

## 2018-05-17 ENCOUNTER — Encounter (HOSPITAL_COMMUNITY): Payer: Self-pay | Admitting: Emergency Medicine

## 2018-05-17 DIAGNOSIS — B370 Candidal stomatitis: Secondary | ICD-10-CM

## 2018-05-17 MED ORDER — CLOTRIMAZOLE 10 MG MT TROC: 10.0000 mg | Freq: Every day | OROMUCOSAL | 0 refills | Status: DC

## 2018-05-17 NOTE — ED Provider Notes (Signed)
[place]   [id number] 05/17/18 Arrival Time: 1020  ASSESSMENT & PLAN:  1. Oral thrush   - pseudomembranous  Meds ordered this encounter  Medications  . clotrimazole (MYCELEX) 10 MG troche    Sig: Take 1 tablet (10 mg total) by mouth 5 (five) times daily.    Dispense:  70 tablet    Refill:  0    Follow-up Information    Shawnee Knapp, MD.   Specialty:  Family Medicine Why:  As needed. Contact information: [place] [address] [phone]           Reviewed expectations re: course of current medical issues. Questions answered. Outlined signs and symptoms indicating need for more acute intervention. Patient verbalized understanding. After Visit Summary given.   SUBJECTIVE:  Beth [name] is a 68 y.o. female who thinks she has oral thrush. Mouth has been feeling very dry for the past week. No pain. H/O similar in the past; approx 6 months ago. Resolved with tx. Does not wear dentures. Normal PO intake without swallowing difficulties. No new medications. Fever reported: no. No neck pain or swelling. No associated n/v/abdominal symptoms.  OTC treatment: none.  ROS: As per HPI.   OBJECTIVE:  Vitals:   05/17/18 1123 05/17/18 1124  BP: (!) 179/78   Pulse: (!) 108   Resp: 20   Temp: 98 F (36.7 C)   SpO2:  93%     General appearance: alert; no distress HEENT: oral thrush on inner cheeks and throat; uvula midline: yes Neck: supple with FROM; no lymphadenopathy CV: RRR Lungs: clear to auscultation bilaterally Abd: soft; non-tender Skin: reveals no rash; warm and dry Psychological: alert and cooperative; normal mood and affect  Allergies  Allergen Reactions  . Cefzil [Cefprozil] Other (See Comments)    Burning sensation in lower legs.   [name] [Tizanidine Hcl] Nausea And Vomiting    Past Medical History:  Diagnosis Date  . Age-related osteoporosis with current pathological fracture 03/11/2017   [facility] at [facility] 03/29/2017 T score -3.1 at Rt total femur  . COPD (chronic obstructive pulmonary disease) ([place])   . Coronary atherosclerosis of native coronary artery 07/22/2017   Seen on chest Ct 07/21/17. Aortic and branch vessel atherosclerosis. Normal heart size, without pericardial effusion. Multivessel coronary artery atherosclerosis.  . Hypertension   . Non-traumatic compression fracture of vertebral column (HCC) 02/18/2017   Chest CT 07/21/17: Moderate T5 and moderate to severe T6 compression deformities w/o significant canal encroachment. Moderate T8 compression deformity is also w/o canal encroachment  . 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 Benjamine Mola 04/2017   Social History   Socioeconomic History  . Marital status: Married    Spouse name: Not on file  . Number of children: Not on file  . Years of education: Not on file  . Highest education level: Not on file  Occupational History  . Not on file  Social Needs  . Financial resource strain: Not on file  . Food insecurity:    Worry: Not on file    Inability: Not on file  . Transportation needs:    Medical: Not on file    Non-medical: Not on file  Tobacco Use  . Smoking status: Former Smoker    Types: Cigarettes    Last attempt to quit: 02/03/2017    Years since quitting: 1.2  . Smokeless tobacco: Never Used  Substance and Sexual Activity  .  Alcohol use: No  . Drug use: No  . Sexual activity: Not on file  Lifestyle  . Physical activity:    Days per week: Not on file    Minutes per session: Not on file  . Stress: Not on file  Relationships  . Social connections:    Talks on phone: Not on file    Gets together: Not on file    Attends religious service: Not on file    Active member of club or organization: Not on file    Attends meetings of clubs or organizations: Not on file    Relationship status: Not on file  . Intimate partner violence:    Fear of current or ex partner: Not on file     Emotionally abused: Not on file    Physically abused: Not on file    Forced sexual activity: Not on file  Other Topics Concern  . Not on file  Social History Narrative  . Not on file   Family History  Problem Relation Age of Onset  . Heart attack Mother   . Stroke Mother   . Lung cancer Father   . Arthritis Sister           Vanessa Kick, MD 05/17/18 1158

## 2018-05-17 NOTE — ED Triage Notes (Signed)
Pt c/o oral thrush, no pain, x1 week.

## 2018-07-19 ENCOUNTER — Ambulatory Visit (HOSPITAL_COMMUNITY)
Admission: EM | Admit: 2018-07-19 | Discharge: 2018-07-19 | Disposition: A | Discharge status: Home / Self Care | Payer: Medicare HMO | Attending: Family Medicine | Admitting: Family Medicine

## 2018-07-19 ENCOUNTER — Encounter (HOSPITAL_COMMUNITY): Payer: Self-pay

## 2018-07-19 ENCOUNTER — Other Ambulatory Visit: Payer: Self-pay

## 2018-07-19 DIAGNOSIS — B370 Candidal stomatitis: Secondary | ICD-10-CM

## 2018-07-19 DIAGNOSIS — R030 Elevated blood-pressure reading, without diagnosis of hypertension: Secondary | ICD-10-CM

## 2018-07-19 MED ORDER — CLOTRIMAZOLE 10 MG MT TROC: 10.0000 mg | Freq: Every day | OROMUCOSAL | 1 refills | Status: DC

## 2018-07-19 NOTE — ED Triage Notes (Signed)
Pt states she saw Dr Mannie Stabile 05/17/2018 for thrush and she's not sure if the medicine ever really cleared it completely up. Pt states her throat is very dry at night.

## 2018-07-19 NOTE — Discharge Instructions (Addendum)
Consider an E visit if you need refills Follow up with Dr Brigitte Pulse

## 2018-07-19 NOTE — ED Provider Notes (Signed)
[name]    CSN: [id number] Arrival date & time: 07/19/18  1102     History   Chief Complaint Chief Complaint  Patient presents with  . Thrush    HPI Beth [name] is a 68 y.o. female.   HPI  Patient states she has had recurring thrush infections.  She thinks is from her Symbicort inhaler.  It was first noted and treated by her pulmonary doctor.  She was here in March of this year for thrush.  She was treated with Mycelex oral.  It worked well.  She has recurrence now.  She feels like it is more in the back of her throat towards her tonsils.  Painful and she can see some white patches.  Her primary care doctor is unavailable to give her a medicine refill.  She is here today hoping for Clotrimazole refill.  She is otherwise healthy.  She states that her breathing is stable.  Past Medical History:  Diagnosis Date  . Age-related osteoporosis with current pathological fracture 03/11/2017   [facility] bone [facility] at [facility] 03/29/2017 T score -3.1 at Rt total femur  . COPD (chronic obstructive pulmonary disease) ([place])   . Coronary atherosclerosis of native coronary artery 07/22/2017   Seen on chest Ct 07/21/17. Aortic and branch vessel atherosclerosis. Normal heart size, without pericardial effusion. Multivessel coronary artery atherosclerosis.  . Hypertension   . Non-traumatic compression fracture of vertebral column (HCC) 02/18/2017   Chest CT 07/21/17: Moderate T5 and moderate to severe T6 compression deformities w/o significant canal encroachment. Moderate T8 compression deformity is also w/o canal encroachment  . 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 Benjamine Mola 04/2017    Patient Active Problem List   Diagnosis Date Noted  . Abnormality of left breast on screening mammogram 01/23/2018  . History of tobacco use 07/22/2017  . Coronary atherosclerosis of native coronary artery 07/22/2017  . Collapse of right lung 07/22/2017  . Age-related  osteoporosis with current pathological fracture 03/11/2017  . Chronic obstructive pulmonary disease ([place]) 03/11/2017  . Acute bilateral thoracic back pain 03/11/2017  . White coat syndrome with high blood pressure but without hypertension 03/11/2017    Past Surgical History:  Procedure Laterality Date  . EYE SURGERY    . MASS EXCISION Right 04/12/2017   Procedure: EXCISION OF RIGHT EAR  MASS;  Surgeon: Leta Baptist, MD;  Location: [place];  Service: ENT;  Laterality: Right; squamous cell cancer with clear margins  . SKIN FULL THICKNESS GRAFT Left 04/12/2017   Procedure: SKIN GRAFT FULL THICKNESS FROM ABDOMEN TO RIGHT EAR;  Surgeon: Leta Baptist, MD;  Location: [place];  Service: ENT;  Laterality: Right  . TONSILLECTOMY      OB History   No obstetric history on file.      Home Medications    Prior to Admission medications   Medication Sig Start Date End Date Taking? Authorizing Provider  acetaminophen (TYLENOL) 500 MG tablet Take 500 mg every 6 (six) hours as needed by mouth.    [provider]  albuterol (PROVENTIL HFA;VENTOLIN HFA) 108 (90 Base) MCG/ACT inhaler Inhale 2 puffs into the lungs every 4 (four) hours as needed for wheezing or shortness of breath (cough, shortness of breath or wheezing.). 01/28/18   Shawnee Knapp, MD  budesonide-formoterol [facility]) 160-4.5 MCG/ACT inhaler Inhale 2 puffs into the lungs 2 (two) times daily. 01/28/18   Shawnee Knapp, MD  clotrimazole [facility])  10 MG troche Take 1 tablet (10 mg total) by mouth 5 (five) times daily. 07/19/18   Raylene Everts, MD    Family History Family History  Problem Relation Age of Onset  . Heart attack Mother   . Stroke Mother   . Lung cancer Father   . Arthritis Sister     Social History Social History   Tobacco Use  . Smoking status: Former Smoker    Types: Cigarettes    Last attempt to quit: 02/03/2017    Years since quitting: 1.4  . Smokeless tobacco: Never Used   Substance Use Topics  . Alcohol use: No  . Drug use: No     Allergies   Cefzil [cefprozil] and Zanaflex [tizanidine hcl]   Review of Systems Review of Systems  Constitutional: Negative for chills and fever.  HENT: Positive for sore throat. Negative for ear pain.   Eyes: Negative for pain and visual disturbance.  Respiratory: Negative for cough and shortness of breath.   Cardiovascular: Negative for chest pain and palpitations.  Gastrointestinal: Negative for abdominal pain and vomiting.  Genitourinary: Negative for dysuria and hematuria.  Musculoskeletal: Negative for arthralgias and back pain.  Skin: Negative for color change and rash.  Neurological: Negative for seizures and syncope.  All other systems reviewed and are negative.    Physical Exam Triage Vital Signs ED Triage Vitals  Enc Vitals Group     BP 07/19/18 1127 (!) 189/73     Pulse Rate 07/19/18 1127 (!) 101     Resp 07/19/18 1127 16     Temp 07/19/18 1127 98.2 F (36.8 C)     Temp Source 07/19/18 1127 Tympanic     SpO2 07/19/18 1127 98 %     Weight 07/19/18 1129 140 lb (63.5 kg)     Height --      Head Circumference --      Peak Flow --      Pain Score 07/19/18 1129 4     Pain Loc --      Pain Edu? --      Excl. in [place]? --    No data found.  Updated Vital Signs BP (!) 189/73 (BP Location: Right Arm)   Pulse (!) 101   Temp 98.2 F (36.8 C) (Tympanic)   Resp 16   Wt 63.5 kg   SpO2 98%   BMI 25.44 kg/m      Physical Exam Constitutional:      General: She is not in acute distress.    Appearance: She is well-developed.  HENT:     Head: Normocephalic and atraumatic.     Right Ear: Tympanic membrane and ear canal normal.     Left Ear: Tympanic membrane and ear canal normal.     Nose: Nose normal.     Mouth/Throat:     Mouth: Mucous membranes are moist.     Comments: In the posterior pharynx there is some erythema, scant white material adherent Eyes:     Conjunctiva/sclera: Conjunctivae  normal.     Pupils: Pupils are equal, round, and reactive to light.  Neck:     Musculoskeletal: Normal range of motion.  Cardiovascular:     Rate and Rhythm: Normal rate.  Pulmonary:     Effort: Pulmonary effort is normal. No respiratory distress.  Abdominal:     General: There is no distension.     Palpations: Abdomen is soft.  Musculoskeletal: Normal range of motion.  Skin:    General:  Skin is warm and dry.  Neurological:     Mental Status: She is alert.  Psychiatric:        Mood and Affect: Mood normal.        Behavior: Behavior normal.      UC Treatments / Results  Labs (all labs ordered are listed, but only abnormal results are displayed) Labs Reviewed - No data to display  EKG None  Radiology No results found.  Procedures Procedures (including critical care time)  Medications Ordered in UC Medications - No data to display  Initial Impression / Assessment and Plan / UC Course  I have reviewed the triage vital signs and the nursing notes.  Pertinent labs & imaging results that were available during my care of the patient were reviewed by me and considered in my medical decision making (see chart for details).     I recommend the patient discuss with her primary care doctor and her pulmonary doctor recurring thrush infections, prevention, possible immunity testing Final Clinical Impressions(s) / UC Diagnoses   Final diagnoses:  None     Discharge Instructions     Consider an E visit if you need refills Follow up with Dr Brigitte Pulse    ED Prescriptions    Medication Sig [place]. Provider   clotrimazole (MYCELEX) 10 MG troche Take 1 tablet (10 mg total) by mouth 5 (five) times daily. 70 tablet Raylene Everts, MD     Controlled Substance Prescriptions [place] Controlled Substance Registry consulted? Not Applicable   Raylene Everts, MD 07/19/18 1256

## 2018-07-25 ENCOUNTER — Ambulatory Visit: Payer: Medicare Other | Admitting: Family Medicine

## 2018-07-27 DIAGNOSIS — L578 Other skin changes due to chronic exposure to nonionizing radiation: Secondary | ICD-10-CM | POA: Diagnosis not present

## 2018-07-27 DIAGNOSIS — L658 Other specified nonscarring hair loss: Secondary | ICD-10-CM | POA: Diagnosis not present

## 2018-07-27 DIAGNOSIS — L718 Other rosacea: Secondary | ICD-10-CM | POA: Diagnosis not present

## 2018-07-27 DIAGNOSIS — I788 Other diseases of capillaries: Secondary | ICD-10-CM | POA: Diagnosis not present

## 2018-10-19 ENCOUNTER — Ambulatory Visit: Payer: Medicare Other | Admitting: Family Medicine

## 2018-11-09 DIAGNOSIS — H5200 Hypermetropia, unspecified eye: Secondary | ICD-10-CM | POA: Diagnosis not present

## 2018-11-30 ENCOUNTER — Encounter: Payer: Self-pay | Admitting: Family Medicine

## 2018-11-30 ENCOUNTER — Other Ambulatory Visit: Payer: Self-pay

## 2018-11-30 ENCOUNTER — Ambulatory Visit (INDEPENDENT_AMBULATORY_CARE_PROVIDER_SITE_OTHER): Payer: Medicare HMO | Admitting: Family Medicine

## 2018-11-30 VITALS — BP 180/92 | HR 126 | Temp 97.7°F | Ht 61.0 in | Wt 142.6 lb

## 2018-11-30 DIAGNOSIS — Z23 Encounter for immunization: Secondary | ICD-10-CM | POA: Diagnosis not present

## 2018-11-30 DIAGNOSIS — J9811 Atelectasis: Secondary | ICD-10-CM

## 2018-11-30 DIAGNOSIS — I10 Essential (primary) hypertension: Secondary | ICD-10-CM | POA: Diagnosis not present

## 2018-11-30 DIAGNOSIS — J439 Emphysema, unspecified: Secondary | ICD-10-CM | POA: Diagnosis not present

## 2018-11-30 DIAGNOSIS — I2510 Atherosclerotic heart disease of native coronary artery without angina pectoris: Secondary | ICD-10-CM | POA: Diagnosis not present

## 2018-11-30 DIAGNOSIS — M8000XD Age-related osteoporosis with current pathological fracture, unspecified site, subsequent encounter for fracture with routine healing: Secondary | ICD-10-CM | POA: Diagnosis not present

## 2018-11-30 DIAGNOSIS — Z5181 Encounter for therapeutic drug level monitoring: Secondary | ICD-10-CM | POA: Diagnosis not present

## 2018-11-30 DIAGNOSIS — R030 Elevated blood-pressure reading, without diagnosis of hypertension: Secondary | ICD-10-CM | POA: Diagnosis not present

## 2018-11-30 DIAGNOSIS — J449 Chronic obstructive pulmonary disease, unspecified: Secondary | ICD-10-CM | POA: Diagnosis not present

## 2018-11-30 MED ORDER — ALBUTEROL SULFATE HFA 108 (90 BASE) MCG/ACT IN AERS: 2.0000 | INHALATION_SPRAY | RESPIRATORY_TRACT | 1 refills | Status: DC | PRN

## 2018-11-30 MED ORDER — ASPIRIN EC 81 MG PO TBEC: 81.0000 mg | DELAYED_RELEASE_TABLET | Freq: Every day | ORAL | 11 refills | Status: AC

## 2018-11-30 NOTE — Progress Notes (Signed)
Established Patient Office Visit  Subjective:  Patient ID: Beth [name], female    DOB: 10-31-1950  Age: 68 y.o. MRN: [id number]  CC:  Chief Complaint  Patient presents with  . chronic conditions    19mf/u    HPI Beth [name] for    Hypertension: Patient here for follow-up of elevated blood pressure.  Patient took her blood pressure overnight and it is 127/87. Her usual ranges is 115/70s She feels so nervous to have to meet a new doctor and to pick up where her health management seemed to stop with COVID BP Readings from Last 3 Encounters:  11/30/18 (!) 180/92  07/19/18 (!) 189/73  05/17/18 (!) 179/78   She reports that she is able to control her bp without any medications She is adherent to DASH diet She denies any chest pains She occasionally has some palpitations  Right collapsed Lung She has right middle lobe lung has been collapsed She was to follow up with Pulmonology March 2020   Atherosclerosis She was notified of the finding of atherosclerosis She denies chest pains, fatty deposits on the skin and eye lids or calf or buttock pain.  The ASCVD Risk score (Mikey BussingDC Jr., et al., 2013) failed to calculate for the following reasons:   Cannot find a previous HDL lab   Cannot find a previous total cholesterol lab   Past Medical History:  Diagnosis Date  . Age-related osteoporosis with current pathological fracture 03/11/2017   [facility] T score -3.1 at Rt total femur  . COPD (chronic obstructive pulmonary disease) ([facility]   . Coronary atherosclerosis of native coronary artery 07/22/2017   Seen on chest Ct 07/21/17. Aortic and branch vessel atherosclerosis. Normal heart size, without pericardial effusion. Multivessel coronary artery atherosclerosis.  . Hypertension   . Non-traumatic compression fracture of vertebral column (HCC) 02/18/2017   Chest CT 07/21/17: Moderate T5 and moderate to severe T6 compression deformities w/o  significant canal encroachment. Moderate T8 compression deformity is also w/o canal encroachment  . 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 TBenjamine Mola02/2019    Past Surgical History:  Procedure Laterality Date  . EYE SURGERY    . MASS EXCISION Right 04/12/2017   Procedure: EXCISION OF RIGHT EAR  MASS;  Surgeon: TLeta Baptist MD;  [place] [facility]  Service: ENT;  Laterality: Right; squamous cell cancer with clear margins  . SKIN FULL THICKNESS GRAFT Left 04/12/2017   Procedure: SKIN GRAFT FULL THICKNESS FROM ABDOMEN TO RIGHT EAR;  Surgeon: TLeta Baptist MD;  [place] [facility]  Service: ENT;  Laterality: Right  . TONSILLECTOMY      Family History  Problem Relation Age of Onset  . Heart attack Mother   . Stroke Mother   . Lung cancer Father   . Arthritis Sister     Social History   Socioeconomic History  . Marital status: Married    Spouse name: Not on file  . Number of children: Not on file  . Years of education: Not on file  . Highest education level: Not on file  Occupational History  . Not on file  Social Needs  . Financial resource strain: Not on file  . Food insecurity    Worry: Not on file    Inability: Not on file  . Transportation needs    Medical: Not on file    Non-medical: Not on file  Tobacco Use  . Smoking status: Former Smoker    Types: Cigarettes    Quit date: 02/03/2017    Years since quitting: 1.8  . Smokeless tobacco: Never Used  Substance and Sexual Activity  . Alcohol use: No  . Drug use: No  . Sexual activity: Not on file  Lifestyle  . Physical activity    Days per week: Not on file    Minutes per session: Not on file  . Stress: Not on file  Relationships  . Social Herbalist on phone: Not on file    Gets together: Not on file    Attends religious service: Not on file    Active member of club or organization: Not on file    Attends meetings of clubs or organizations:  Not on file    Relationship status: Not on file  . Intimate partner violence    Fear of current or ex partner: Not on file    Emotionally abused: Not on file    Physically abused: Not on file    Forced sexual activity: Not on file  Other Topics Concern  . Not on file  Social History Narrative  . Not on file    Outpatient Medications Prior to Visit  Medication Sig Dispense Refill  . acetaminophen (TYLENOL) 500 MG tablet Take 500 mg every 6 (six) hours as needed by mouth.    . budesonide-formoterol (SYMBICORT) 160-4.5 MCG/ACT inhaler Inhale 2 puffs into the lungs 2 (two) times daily. 1 Inhaler 11  . clotrimazole (MYCELEX) 10 MG troche Take 1 tablet (10 mg total) by mouth 5 (five) times daily. 70 tablet 1  . albuterol (PROVENTIL HFA;VENTOLIN HFA) 108 (90 Base) MCG/ACT inhaler Inhale 2 puffs into the lungs every 4 (four) hours as needed for wheezing or shortness of breath (cough, shortness of breath or wheezing.). 1 Inhaler 11   No facility-administered medications prior to visit.     Allergies  Allergen Reactions  . Cefzil [Cefprozil] Other (See Comments)    Burning sensation in lower legs.   [name] [Tizanidine Hcl] Nausea And Vomiting    ROS Review of Systems Review of Systems  Constitutional: Negative for activity change, appetite change, chills and fever.  HENT: Negative for congestion, nosebleeds, trouble swallowing and voice change.   Respiratory: Negative for cough, shortness of breath and wheezing.   Gastrointestinal: Negative for diarrhea, nausea and vomiting.  Genitourinary: Negative for difficulty urinating, dysuria, flank pain and hematuria.  Musculoskeletal: Negative for back pain, joint swelling and neck pain.  Neurological: Negative for dizziness, speech difficulty, light-headedness and numbness.  See HPI. All other review of systems negative.     Objective:    Physical Exam  BP (!) 180/92 (BP Location: Left Arm, Patient Position: Sitting, Cuff Size:  Normal)   Pulse (!) 126   Temp 97.7 F (36.5 C) (Oral)   Ht '5\' 1"'  (1.549 m)   Wt 142 lb 9.6 oz (64.7 kg)   SpO2 92%   BMI 26.94 kg/m  Wt Readings from Last 3 Encounters:  11/30/18 142 lb 9.6 oz (64.7 kg)  07/19/18 140 lb (63.5 kg)  01/28/18 135 lb (61.2 kg)   Physical Exam  Constitutional: Oriented to person, place, and time. Appears well-developed and well-nourished.  HENT:  Head: Normocephalic and atraumatic.  Eyes: Conjunctivae and EOM are normal.  Cardiovascular: Normal rate, regular rhythm, normal heart sounds and intact distal pulses.  No murmur heard. Pulmonary/Chest: Effort normal and breath sounds normal. No  stridor. No respiratory distress. Has no wheezes.  Neurological: Is alert and oriented to person, place, and time.  Skin: Skin is warm. Capillary refill takes less than 2 seconds.  Psychiatric: Has a normal mood and affect. Behavior is normal. Judgment and thought content normal.    Health Maintenance Due  Topic Date Due  . TETANUS/TDAP  05/27/1967  . INFLUENZA VACCINE  10/08/2018    There are no preventive care reminders to display for this patient.  No results found for: TSH Lab Results  Component Value Date   WBC 7.7 02/18/2017   HGB 14.3 02/18/2017   HCT 43.2 02/18/2017   MCV 90.6 02/18/2017   PLT 134 (L) 02/24/2015   Lab Results  Component Value Date   NA 143 07/10/2017   K 4.7 07/10/2017   CO2 25 07/10/2017   GLUCOSE 127 (H) 07/10/2017   BUN 14 07/10/2017   CREATININE 0.75 07/10/2017   BILITOT 0.3 07/10/2017   ALKPHOS 73 07/10/2017   AST 26 07/10/2017   ALT 19 07/10/2017   PROT 7.6 07/10/2017   ALBUMIN 5.0 (H) 07/10/2017   CALCIUM 10.0 07/10/2017   ANIONGAP 9 02/24/2015      Assessment & Plan:   Problem List Items Addressed This Visit      Cardiovascular and Mediastinum   White coat syndrome with high blood pressure but without hypertension (Chronic)   Relevant Medications   aspirin EC 81 MG tablet     Respiratory   Chronic  obstructive pulmonary disease (HCC) - stable   Relevant Medications   albuterol (VENTOLIN HFA) 108 (90 Base) MCG/ACT inhaler   Collapse of right lung     Musculoskeletal and Integument   Age-related osteoporosis with current pathological fracture History of fracture, on drug holiday from fosamax   Relevant Orders   TSH   CBC     Other   Coronary atherosclerosis of native coronary artery - will monitor  Discussed lipids and other factors that can increase atherosclerosis   Relevant Orders   Lipid panel   CMP14+EGFR   TSH   CBC    Other Visit Diagnoses    Essential hypertension    -  Primary bp at goal   Relevant Medications   aspirin EC 81 MG tablet   Other Relevant Orders   Lipid panel   CMP14+EGFR   TSH   Medication monitoring encounter       Need for prophylactic vaccination and inoculation against influenza       Need for influenza vaccination       Relevant Orders   Flu Vaccine QUAD High Dose(Fluad) (Completed)     Elevated BP with history of white coat syndrome  Meds ordered this encounter  Medications  . aspirin EC 81 MG tablet    Sig: Take 1 tablet (81 mg total) by mouth daily.    Dispense:  30 tablet    Refill:  11  . albuterol (VENTOLIN HFA) 108 (90 Base) MCG/ACT inhaler    Sig: Inhale 2 puffs into the lungs every 4 (four) hours as needed for wheezing or shortness of breath (cough, shortness of breath or wheezing.).    Dispense:  18 g    Refill:  1    Follow-up: No follow-ups on file.    Forrest Moron, MD

## 2018-11-30 NOTE — Patient Instructions (Signed)
° ° ° °  If you have lab work done today you will be contacted with your lab results within the next 2 weeks.  If you have not heard from us then please contact us. The fastest way to get your results is to register for My Chart. ° ° °IF you received an x-ray today, you will receive an invoice from [facility]. Please contact [facility] at [phone] with questions or concerns regarding your invoice.  ° °IF you received labwork today, you will receive an invoice from [facility]. Please contact [facility] at [phone] with questions or concerns regarding your invoice.  ° °Our billing staff will not be able to assist you with questions regarding bills from these companies. ° °You will be contacted with the lab results as soon as they are available. The fastest way to get your results is to activate your My Chart account. Instructions are located on the last page of this paperwork. If you have not heard from us regarding the results in 2 weeks, please contact this office. °  ° ° ° °

## 2018-12-01 LAB — CMP14+EGFR
ALT: 21 IU/L (ref 0–32)
AST: 24 IU/L (ref 0–40)
Albumin/Globulin Ratio: 1.8 (ref 1.2–2.2)
Albumin: 4.7 g/dL (ref 3.8–4.8)
Alkaline Phosphatase: 68 IU/L (ref 39–117)
BUN/Creatinine Ratio: 20 (ref 12–28)
BUN: 15 mg/dL (ref 8–27)
Bilirubin Total: 0.3 mg/dL (ref 0.0–1.2)
CO2: 24 mmol/L (ref 20–29)
Calcium: 9.6 mg/dL (ref 8.7–10.3)
Chloride: 102 mmol/L (ref 96–106)
Creatinine, Ser: 0.74 mg/dL (ref 0.57–1.00)
GFR calc Af Amer: 95 mL/min/{1.73_m2} (ref >59)
GFR calc non Af Amer: 82 mL/min/{1.73_m2} (ref >59)
Globulin, Total: 2.6 g/dL (ref 1.5–4.5)
Glucose: 99 mg/dL (ref 65–99)
Potassium: 4.3 mmol/L (ref 3.5–5.2)
Sodium: 141 mmol/L (ref 134–144)
Total Protein: 7.3 g/dL (ref 6.0–8.5)

## 2018-12-01 LAB — LIPID PANEL
Chol/HDL Ratio: 3.1 ratio (ref 0.0–4.4)
Cholesterol, Total: 209 mg/dL — ABNORMAL HIGH (ref 100–199)
HDL: 68 mg/dL (ref >39)
LDL Chol Calc (NIH): 116 mg/dL — ABNORMAL HIGH (ref 0–99)
Triglycerides: 146 mg/dL (ref 0–149)
VLDL Cholesterol Cal: 25 mg/dL (ref 5–40)

## 2018-12-01 LAB — CBC
Hematocrit: 46.3 % (ref 34.0–46.6)
Hemoglobin: 15.3 g/dL (ref 11.1–15.9)
MCH: 30.3 pg (ref 26.6–33.0)
MCHC: 33 g/dL (ref 31.5–35.7)
MCV: 92 fL (ref 79–97)
Platelets: 270 10*3/uL (ref 150–450)
RBC: 5.05 x10E6/uL (ref 3.77–5.28)
RDW: 12.9 % (ref 11.7–15.4)
WBC: 7.6 10*3/uL (ref 3.4–10.8)

## 2018-12-01 LAB — TSH: TSH: 0.874 u[IU]/mL (ref 0.450–4.500)

## 2018-12-05 ENCOUNTER — Ambulatory Visit: Payer: Medicare HMO | Admitting: Pulmonary Disease

## 2018-12-05 ENCOUNTER — Telehealth: Payer: Self-pay | Admitting: Pulmonary Disease

## 2018-12-05 ENCOUNTER — Other Ambulatory Visit: Payer: Self-pay

## 2018-12-05 ENCOUNTER — Encounter: Payer: Self-pay | Admitting: Pulmonary Disease

## 2018-12-05 VITALS — BP 156/101 | HR 110 | Temp 97.9°F | Ht 62.0 in | Wt 142.6 lb

## 2018-12-05 DIAGNOSIS — J9611 Chronic respiratory failure with hypoxia: Secondary | ICD-10-CM | POA: Diagnosis not present

## 2018-12-05 DIAGNOSIS — J439 Emphysema, unspecified: Secondary | ICD-10-CM | POA: Diagnosis not present

## 2018-12-05 DIAGNOSIS — J432 Centrilobular emphysema: Secondary | ICD-10-CM | POA: Diagnosis not present

## 2018-12-05 DIAGNOSIS — J449 Chronic obstructive pulmonary disease, unspecified: Secondary | ICD-10-CM | POA: Diagnosis not present

## 2018-12-05 MED ORDER — TRELEGY ELLIPTA 100-62.5-25 MCG/INH IN AEPB: 1.0000 | INHALATION_SPRAY | Freq: Every day | RESPIRATORY_TRACT | 0 refills | Status: DC

## 2018-12-05 MED ORDER — TRELEGY ELLIPTA 100-62.5-25 MCG/INH IN AEPB: 1.0000 | INHALATION_SPRAY | Freq: Every day | RESPIRATORY_TRACT | 5 refills | Status: DC

## 2018-12-05 NOTE — Progress Notes (Signed)
[facility]  Chief Complaint  Patient presents with  . Centrilobuluar emphysema    Has noticed medications have not been working as well in last 3 - 4 weeks.    Constitutional:  BP (!) 156/101 (BP Location: Right Arm, Patient Position: Sitting, Cuff Size: Normal)   Pulse (!) 110   Temp 97.9 F (36.6 C)   Ht 5\' 2"  (1.575 m)   Wt 142 lb 9.6 oz (64.7 kg)   SpO2 93% Comment: on room air  BMI 26.08 kg/m   Past Medical History:  HTN, CAD, Osteoporosis  Brief Summary:  Beth [name] is a 68 y.o. female former smoker with emphysema and right middle lung scarring.  She has noticed more trouble with her breathing.  Getting leg and arm cramps.  Feels her heart flutter when she is short of breath. Not able to keep up with activity as well.  Not having cough, wheeze, sputum, fever, hemoptysis, or leg cramps.  Gets dry mouth at night.  Not aware of snoring.  Physical Exam:   Appearance - well kempt   ENMT - clear nasal mucosa, midline nasal  septum, no oral exudates, no LAN, trachea midline  Respiratory - normal chest wall, normal respiratory effort, no accessory muscle use, no wheeze/rales  CV - s1s2 regular rate and rhythm, no murmurs, no peripheral edema, radial pulses symmetric  GI - soft, non tender, no masses  Lymph - no adenopathy noted in neck and axillary areas  MSK - normal gait  Ext - no cyanosis, clubbing, or joint inflammation noted  Skin - no rashes, lesions, or ulcers  Neuro - normal strength, oriented x 3  Psych - normal mood and affect  Assessment/Plan:   COPD with emphysema. - will try changing from symbicort to trelegy - prn albuterol  Chronic respiratory failure with hypoxia. - needs 2 liters oxygen with exertion  - goal SpO2 > 90% - will arrange for POC - will set up ONO with RA and then determine if she needs to use oxygen while asleep also  History of tobacco abuse. - stable findings on CT chest from  November 2019 - she has f/u scan scheduled for November 2020   Patient Instructions  Trelegy one puff daily, and rinse mouth after each use  Don't use symbicort while using trelegy  Use 2 liters with activity during the day  Will arrange for overnight oxygen test and then determine if you need to use oxygen at night also  Follow up in 2 months    Chesley Mires, MD [place] Pager: [phone] 12/05/2018, 12:37 PM  Flow Sheet     Pulmonary tests:  PFT 11/09/17 >> FEV1 0.71 (35%), FEV1% 41, TLC 5.52 (119%), DLCO 48%, no BD A1AT 10/04/17 >> 108, MS PFT 11/09/17 >> FEV1 0.71 (35%), FEV1% 41, TLC 5.52 (119%), RV 3.71 (182%), DLCO 48, no BD Ambulatory oximetry RA 12/05/18 >> SpO2 88%  Chest imaging:  CT chest 07/21/17 >> atherosclerosis, bullous emphysema, RML partial collapse  Medications:   Allergies as of 12/05/2018      Reactions   Cefzil [cefprozil] Other (See Comments)   Burning sensation in lower legs.    Zanaflex [tizanidine Hcl] Nausea And Vomiting      Medication List       Accurate as of December 05, 2018 12:37 PM. If you have any questions, ask your nurse or doctor.        STOP taking these medications  budesonide-formoterol 160-4.5 MCG/ACT inhaler Commonly known as: SYMBICORT Stopped by: Chesley Mires, MD   clotrimazole 10 MG troche Commonly known as: MYCELEX Stopped by: Chesley Mires, MD     TAKE these medications   acetaminophen 500 MG tablet Commonly known as: TYLENOL Take 500 mg every 6 (six) hours as needed by mouth.   albuterol 108 (90 Base) MCG/ACT inhaler Commonly known as: VENTOLIN HFA Inhale 2 puffs into the lungs every 4 (four) hours as needed for wheezing or shortness of breath (cough, shortness of breath or wheezing.).   aspirin EC 81 MG tablet Take 1 tablet (81 mg total) by mouth daily.   Trelegy Ellipta 100-62.5-25 MCG/INH Aepb Generic drug: Fluticasone-Umeclidin-Vilant Inhale 1 puff into the lungs daily.  Started by: Chesley Mires, MD   Trelegy Ellipta 100-62.5-25 MCG/INH Aepb Generic drug: Fluticasone-Umeclidin-Vilant Inhale 1 puff into the lungs daily. Started by: Chesley Mires, MD       Past Surgical History:  She  has a past surgical history that includes Tonsillectomy; Eye surgery; Mass excision (Right, 04/12/2017); and Full thickness skin graft (Left, 04/12/2017).  Family History:  Her family history includes Arthritis in her sister; Heart attack in her mother; Lung cancer in her father; Stroke in her mother.  Social History:  She  reports that she quit smoking about 22 months ago. Her smoking use included cigarettes. She has never used smokeless tobacco. She reports that she does not drink alcohol or use drugs.

## 2018-12-05 NOTE — Patient Instructions (Addendum)
Trelegy one puff daily, and rinse mouth after each use  Don't use symbicort while using trelegy  Use 2 liters with activity during the day  Will arrange for overnight oxygen test and then determine if you need to use oxygen at night also  Follow up in 2 months

## 2018-12-06 DIAGNOSIS — J432 Centrilobular emphysema: Secondary | ICD-10-CM | POA: Diagnosis not present

## 2018-12-06 DIAGNOSIS — J9611 Chronic respiratory failure with hypoxia: Secondary | ICD-10-CM | POA: Diagnosis not present

## 2018-12-06 DIAGNOSIS — J449 Chronic obstructive pulmonary disease, unspecified: Secondary | ICD-10-CM | POA: Diagnosis not present

## 2018-12-06 NOTE — Telephone Encounter (Signed)
Will route message to [facility] to see how this works.   PCC's patient is having an ONO with [employer] but oxygen order was [facility]. Levada Dy with [employer] is questioning whether or not they should be filling the oxygen order as well.   Please advise. Does it matter it they have the ONO at different company than oxygen. Thanks.

## 2018-12-06 NOTE — Telephone Encounter (Signed)
The 02 should go to the dme who did the ono if it was not specified in the order to use that dme then we do use other companies sometimes we soul not it the order say to use [employer] and at this point we can switch if if pts wants [place] to [place]

## 2018-12-06 NOTE — Telephone Encounter (Signed)
O2 order and ono has been sent to [facility].  Nothing further needed.

## 2018-12-06 NOTE — Telephone Encounter (Signed)
Pt states [facility] out of [place] is bringing patient oxygen.  Pt wanted to let us know.  [phone].

## 2018-12-06 NOTE — Telephone Encounter (Signed)
Call made to patient, she reports she would rather have a company that is more local to her. She reports [place] is too far if she needs something addressed. She would like to to get her oxygen from [employer]. She states she is going to call them and see if they take her insurance and she will give [place] a call back.   Will hold message in triage until she calls back.

## 2018-12-09 ENCOUNTER — Telehealth: Payer: Self-pay | Admitting: Pulmonary Disease

## 2018-12-09 DIAGNOSIS — J449 Chronic obstructive pulmonary disease, unspecified: Secondary | ICD-10-CM

## 2018-12-09 NOTE — Telephone Encounter (Signed)
Call returned to [name], spoke with [place], she states she needs a new script for the patients ONO. She states the patient changed the batteries in the ONO machine and it reset and erased all the data. I made her aware I would get a new order placed.   Will route message to VS as FYI.   VS placing new order, patient did ONO however she changed the batteries in the machine and it erased the data. Will have to place new order. Just FYI.   Nothing further needed at this time.

## 2018-12-14 ENCOUNTER — Telehealth: Payer: Self-pay | Admitting: Pulmonary Disease

## 2018-12-14 NOTE — Telephone Encounter (Signed)
Call made to patient, she states she has spoken with [facility], they will delivering her equipment today. Nothing further needed at this time.

## 2018-12-27 DIAGNOSIS — L821 Other seborrheic keratosis: Secondary | ICD-10-CM | POA: Diagnosis not present

## 2018-12-27 DIAGNOSIS — D235 Other benign neoplasm of skin of trunk: Secondary | ICD-10-CM | POA: Diagnosis not present

## 2018-12-27 DIAGNOSIS — D485 Neoplasm of uncertain behavior of skin: Secondary | ICD-10-CM | POA: Diagnosis not present

## 2018-12-27 DIAGNOSIS — D229 Melanocytic nevi, unspecified: Secondary | ICD-10-CM | POA: Diagnosis not present

## 2018-12-27 DIAGNOSIS — L570 Actinic keratosis: Secondary | ICD-10-CM | POA: Diagnosis not present

## 2018-12-27 DIAGNOSIS — L819 Disorder of pigmentation, unspecified: Secondary | ICD-10-CM | POA: Diagnosis not present

## 2018-12-27 DIAGNOSIS — L814 Other melanin hyperpigmentation: Secondary | ICD-10-CM | POA: Diagnosis not present

## 2019-01-05 DIAGNOSIS — J432 Centrilobular emphysema: Secondary | ICD-10-CM | POA: Diagnosis not present

## 2019-01-05 DIAGNOSIS — J9611 Chronic respiratory failure with hypoxia: Secondary | ICD-10-CM | POA: Diagnosis not present

## 2019-01-05 DIAGNOSIS — J449 Chronic obstructive pulmonary disease, unspecified: Secondary | ICD-10-CM | POA: Diagnosis not present

## 2019-02-05 DIAGNOSIS — J449 Chronic obstructive pulmonary disease, unspecified: Secondary | ICD-10-CM | POA: Diagnosis not present

## 2019-02-05 DIAGNOSIS — J9611 Chronic respiratory failure with hypoxia: Secondary | ICD-10-CM | POA: Diagnosis not present

## 2019-02-05 DIAGNOSIS — J432 Centrilobular emphysema: Secondary | ICD-10-CM | POA: Diagnosis not present

## 2019-02-06 ENCOUNTER — Encounter: Payer: Self-pay | Admitting: Pulmonary Disease

## 2019-02-06 ENCOUNTER — Other Ambulatory Visit: Payer: Self-pay

## 2019-02-06 ENCOUNTER — Ambulatory Visit: Payer: Medicare HMO | Admitting: Pulmonary Disease

## 2019-02-06 VITALS — BP 134/86 | HR 74 | Temp 97.1°F | Ht 62.0 in | Wt 147.6 lb

## 2019-02-06 DIAGNOSIS — J9611 Chronic respiratory failure with hypoxia: Secondary | ICD-10-CM

## 2019-02-06 DIAGNOSIS — J432 Centrilobular emphysema: Secondary | ICD-10-CM

## 2019-02-06 DIAGNOSIS — J449 Chronic obstructive pulmonary disease, unspecified: Secondary | ICD-10-CM | POA: Diagnosis not present

## 2019-02-06 DIAGNOSIS — Z87891 Personal history of nicotine dependence: Secondary | ICD-10-CM | POA: Diagnosis not present

## 2019-02-06 MED ORDER — TRELEGY ELLIPTA 100-62.5-25 MCG/INH IN AEPB: 1.0000 | INHALATION_SPRAY | Freq: Every day | RESPIRATORY_TRACT | 0 refills | Status: DC

## 2019-02-06 NOTE — Patient Instructions (Signed)
Will try to locate overnight oxygen test you did recently  Will make sure you are schedule for follow up CT scan of your chest as part of the lung cancer screening program  Will have our patient care coordinators investigate why your home oxygen set up was denied coverage by your insurance  Follow up in 6 months

## 2019-02-06 NOTE — Progress Notes (Signed)
Beth [name], Beth [name], Beth [name]  Chief Complaint  Patient presents with  . Centrilobular emphysema    Trelegy has been working. Breathing has improved.    Constitutional:  BP 134/86 (BP Location: Left Arm, Patient Position: Sitting, Cuff Size: Normal)   Pulse 74   Temp (!) 97.1 F (36.2 C)   Ht 5\' 2"  (1.575 m)   Wt 147 lb 9.6 oz (67 kg)   SpO2 96% Comment: on room air  BMI 27.00 kg/m   Past Medical History:  HTN, CAD, Osteoporosis  Brief Summary:  Beth [name] is a 68 y.o. female former smoker with emphysema Beth right middle lung scarring.  Breathing better since she started trelegy.  She is concerned about cost of inhaler.  Has intermittent cough with clear sputum.  Not having fever, chest pain, or hemoptysis.  She has been using 2 liters oxygen with exertion at home.  She was told by her insurance that oxygen set up was denied, but wasn't clear why.  She is worried that she can afford oxygen set up if insurance won't cover it.  She reports having overnight oximetry done, but report was never received by me.  She hasn't heard about when her f/u CT chest will be scheduled.   Physical Exam:   Appearance - well kempt   ENMT - no sinus tenderness, no nasal discharge, no oral exudate  Neck - no masses, trachea midline, no thyromegaly, no elevation in JVP  Respiratory - normal appearance of chest wall, normal respiratory effort w/o accessory muscle use, no dullness on percussion, no wheezing or rales  CV - s1s2 regular rate Beth rhythm, no murmurs, no peripheral edema, radial pulses symmetric  GI - soft, non tender  Lymph - no adenopathy noted in neck Beth axillary areas  MSK - normal gait  Ext - no cyanosis, clubbing, or joint inflammation noted  Skin - no rashes, lesions, or ulcers  Neuro - normal strength, oriented x 3  Psych - normal mood Beth affect   Assessment/Plan:   COPD with emphysema. - continue trelegy with prn albuterol - she  is going to look into financial assistance options to keep up with purchasing her inhaler  Chronic respiratory failure with hypoxia. - not sure why insurance denied claim for home oxygen set up; will have our patient [name] coordinator look into this - continue 2 liters oxygen with exertion - will try to track down report from her overnight oximetry done with room air  History of tobacco abuse. - stable findings on CT chest from November 2019 - will make sure she has f/u CT chest scheduled through lung cancer screening program    Patient Instructions  Will try to locate overnight oxygen test you did recently  Will make sure you are schedule for follow up CT scan of your chest as part of the lung cancer screening program  Will have our patient [name] coordinators investigate why your home oxygen set up was denied coverage by your insurance  Follow up in 6 months   A total of  26 minutes were spent face to face with the patient Beth more than half of that time involved counseling or coordination of [name].   Chesley Mires, MD [place] [name]/Beth [name] Pager: [phone] 02/06/2019, 11:05 AM  Flow Sheet     [name] tests:  PFT 11/09/17 >> FEV1 0.71 (35%), FEV1% 41, TLC 5.52 (119%), DLCO 48%, no BD A1AT 10/04/17 >> 108, MS PFT 11/09/17 >> FEV1 0.71 (35%), FEV1%  41, TLC 5.52 (119%), RV 3.71 (182%), DLCO 48, no BD Ambulatory oximetry RA 12/05/18 >> SpO2 88%  Chest imaging:  CT chest 07/21/17 >> atherosclerosis, bullous emphysema, RML partial collapse  Medications:   Allergies as of 02/06/2019      Reactions   Cefzil [cefprozil] Other (See Comments)   Burning sensation in lower legs.    Zanaflex [tizanidine Hcl] Nausea Beth Vomiting      Medication List       Accurate as of February 06, 2019 11:05 AM. If you have any questions, ask your nurse or doctor.        acetaminophen 500 MG tablet Commonly known as: TYLENOL Take 500 mg every 6 (six) hours as needed by mouth.    albuterol 108 (90 Base) MCG/ACT inhaler Commonly known as: VENTOLIN HFA Inhale 2 puffs into the lungs every 4 (four) hours as needed for wheezing or shortness of breath (cough, shortness of breath or wheezing.).   alendronate 70 MG tablet Commonly known as: FOSAMAX once a week.   aspirin EC 81 MG tablet Take 1 tablet (81 mg total) by mouth daily.   Trelegy Ellipta 100-62.5-25 MCG/INH Aepb Generic drug: Fluticasone-Umeclidin-Vilant Inhale 1 puff into the lungs daily.   Trelegy Ellipta 100-62.5-25 MCG/INH Aepb Generic drug: Fluticasone-Umeclidin-Vilant Inhale 1 puff into the lungs daily.       Past Surgical History:  She  has a past surgical history that includes Tonsillectomy; Eye surgery; Mass excision (Right, 04/12/2017); Beth Full thickness skin graft (Left, 04/12/2017).  Family History:  Her family history includes Arthritis in her sister; Heart attack in her mother; Lung cancer in her father; Stroke in her mother.  Social History:  She  reports that she quit smoking about 2 years ago. Her smoking use included cigarettes. She has never used smokeless tobacco. She reports that she does not drink alcohol or use drugs.

## 2019-02-06 NOTE — Addendum Note (Signed)
Addended by: Nena Polio on: 02/06/2019 11:32 AM   Modules accepted: Orders

## 2019-02-07 ENCOUNTER — Telehealth: Payer: Self-pay | Admitting: General Surgery

## 2019-02-07 NOTE — Telephone Encounter (Signed)
She was scheduled for the Chest CT. Will be 02/28/19 at 11:00.

## 2019-02-07 NOTE — Telephone Encounter (Signed)
Noted.  Thanks for follow up.

## 2019-02-08 ENCOUNTER — Telehealth: Payer: Self-pay | Admitting: Pulmonary Disease

## 2019-02-08 NOTE — Telephone Encounter (Signed)
LMTCB with [name].   Pt has oxygen with [facility]. See documentation.

## 2019-02-09 ENCOUNTER — Telehealth: Payer: Self-pay | Admitting: *Deleted

## 2019-02-09 NOTE — Telephone Encounter (Signed)
Patient was scheduled on 3-15- 10:00 am for AWV  Patient thought it was her 6 th month follow up with Dr. Kaleen Mask.      Can call patient and reschedule this appointment, Dr. Kaleen Mask schedule is saying she is unavailable that week.

## 2019-02-09 NOTE — Telephone Encounter (Signed)
Called and spoke to [place] with [employer]. Pt was given a letter from insurance stating they would not cover her O2. [employer] investigated and retroactively covered pt's O2. No issues regarding O2 coverage at this time. I did ask [name] about pt's ONO ([place]). She states she will have this faxed to our front/main fax. Will hold in triage to follow up on.

## 2019-02-10 NOTE — Telephone Encounter (Signed)
Pt believed she already had an appt with Nolon Rod in March, but the appt notes say pt was to cb after she speaks with her husband. She did not call back and we scheduled her appt in April. Six months from her last appt

## 2019-02-10 NOTE — Telephone Encounter (Signed)
Called pt's daughter Erline Levine letting her know the info from yesterday in regards to pt's O2 should being covered by insurance. Stated to her we would call [employer] to get status of pt's O2 and then call her back when have more info and she verbalized understanding.  Attempted to call [name] from [employer] but unable to reach. Left message for [name] to return call.

## 2019-02-10 NOTE — Telephone Encounter (Signed)
PT DAUGHTER CALLING TO CHECK ON THE STATUS OF PT REQUEST FOR O2 SHE CAN BE REACHED @ [phone] HER  NAME IS STACEY Clavel.Hillery Hunter

## 2019-02-14 ENCOUNTER — Telehealth: Payer: Self-pay | Admitting: Pulmonary Disease

## 2019-02-14 ENCOUNTER — Ambulatory Visit (INDEPENDENT_AMBULATORY_CARE_PROVIDER_SITE_OTHER): Payer: Medicare HMO | Admitting: Family Medicine

## 2019-02-14 VITALS — BP 134/86 | Ht 62.0 in | Wt 142.0 lb

## 2019-02-14 DIAGNOSIS — J9611 Chronic respiratory failure with hypoxia: Secondary | ICD-10-CM

## 2019-02-14 DIAGNOSIS — Z0000 Encounter for general adult medical examination without abnormal findings: Secondary | ICD-10-CM | POA: Diagnosis not present

## 2019-02-14 NOTE — Telephone Encounter (Signed)
retuen call.Beth [name]

## 2019-02-14 NOTE — Telephone Encounter (Signed)
Spoke with Lovey Newcomer at [facility], states that they have no ONO on the pt that would be appropriate for insurance.  States that they have tried twice to get an ONO, the first time the pt changed the batteries from the device and the ONO was wiped from memory, and the second time the pt did not wear the device long enough for insurance to consider it diagnostic.  Lovey Newcomer is asking if VS would like to try for a third time getting the ONO.  VS please advise.  Thanks!

## 2019-02-14 NOTE — Telephone Encounter (Signed)
Yes.  Need to retry overnight oximetry on room air.  We need to determine if she needs to use supplemental oxygen at night.

## 2019-02-14 NOTE — Telephone Encounter (Signed)
Spoke with Lovey Newcomer at [facility], aware of recs.  States she will need a new ONO order to process this.  Order placed.  Nothing further needed at this time- will close encounter.

## 2019-02-14 NOTE — Telephone Encounter (Signed)
lmtcb for [facility] at [facility]

## 2019-02-14 NOTE — Telephone Encounter (Signed)
Multiple messages regarding this encounter: Beth [name] with [employer] called our office (see 12/8 phone note) and I have left a message for her to call back.

## 2019-02-14 NOTE — Progress Notes (Signed)
Presents today for [employer] Visit   Date of last exam: 11/30/2018  Interpreter used for this visit? No  I connected with  Beth [name] on 02/14/19 by telephone and verified that I am speaking with the correct person using two identifiers.   I discussed the limitations of evaluation and management by telemedicine. The patient expressed understanding and agreed to proceed.   Patient Care Team: Forrest Moron, MD as PCP - General (Internal Medicine) Leta Baptist, MD as Consulting Physician (Otolaryngology) Murlean Iba, MD as Referring Physician (Orthopedic Surgery) Chesley Mires, MD as Consulting Physician (Pulmonary Disease)   Other items to address today:  Discussed immunizations TDAP decline due to insurance Discussed Eye/Dental Follow up scheduled Dr. Kaleen Mask 06-30-2018  Other Screening:  Last lipid screening: 11/30/2018  ADVANCE DIRECTIVES: Discussed: yes On File: no Materials Provided:yes (mailed)  Immunization status:  Immunization History  Administered Date(s) Administered  . Fluad Quad(high Dose 65+) 11/30/2018  . Influenza, High Dose Seasonal PF 02/06/2017, 12/09/2017  . Influenza,inj,Quad PF,6+ Mos 03/08/2017  . Pneumococcal Conjugate-13 01/28/2018  . Pneumococcal Polysaccharide-23 03/10/2015     Health Maintenance Due  Topic Date Due  . Samul Dada  05/27/1969     Functional Status Survey: Is the patient deaf or have difficulty hearing?: Yes(tinitus) Does the patient have difficulty seeing, even when wearing glasses/contacts?: No Does the patient have difficulty concentrating, remembering, or making decisions?: No Does the patient have difficulty walking or climbing stairs?: No Does the patient have difficulty dressing or bathing?: No Does the patient have difficulty doing errands alone such as visiting a doctor's office or shopping?: No   6CIT Screen 02/14/2019 01/28/2018  What Year? 0 points 0 points   What month? 0 points 0 points  What time? 0 points 0 points  Count back from 20 0 points 0 points  Months in reverse 0 points 0 points  Repeat phrase 0 points 0 points  Total Score 0 0        Clinical Support from 02/14/2019 in [facility] at [facility]  AUDIT-C Score  0       Home Environment:   Lives in one story home No trouble climbing stairs No scattered rugs No grab bars Adequate lighting/no clutter   Patient Active Problem List   Diagnosis Date Noted  . Abnormality of left breast on screening mammogram 01/23/2018  . History of tobacco use 07/22/2017  . Coronary atherosclerosis of native coronary artery 07/22/2017  . Collapse of right lung 07/22/2017  . Age-related osteoporosis with current pathological fracture 03/11/2017  . Chronic obstructive pulmonary disease ([place]) 03/11/2017  . Acute bilateral thoracic back pain 03/11/2017  . White coat syndrome with high blood pressure but without hypertension 03/11/2017     Past Medical History:  Diagnosis Date  . Age-related osteoporosis with current pathological fracture 03/11/2017   [facility]A bone scan at [facility] 03/29/2017 T score -3.1 at Rt total femur  . COPD (chronic obstructive pulmonary disease) ([place])   . Coronary atherosclerosis of native coronary artery 07/22/2017   Seen on chest Ct 07/21/17. Aortic and branch vessel atherosclerosis. Normal heart size, without pericardial effusion. Multivessel coronary artery atherosclerosis.  . Hypertension   . Non-traumatic compression fracture of vertebral column (HCC) 02/18/2017   Chest CT 07/21/17: Moderate T5 and moderate to severe T6 compression deformities w/o significant canal encroachment. Moderate T8 compression deformity is also w/o canal encroachment  . Squamous cell cancer of external ear, right 03/2017   right, excison  by Dr. Benjamine Mola 04/2017     Past Surgical History:  Procedure Laterality Date  . EYE SURGERY    . MASS EXCISION Right 04/12/2017   Procedure: EXCISION OF  RIGHT EAR  MASS;  Surgeon: Leta Baptist, MD;  Location: [place];  Service: ENT;  Laterality: Right; squamous cell cancer with clear margins  . SKIN FULL THICKNESS GRAFT Left 04/12/2017   Procedure: SKIN GRAFT FULL THICKNESS FROM ABDOMEN TO RIGHT EAR;  Surgeon: Leta Baptist, MD;  Location: [place];  Service: ENT;  Laterality: Right  . TONSILLECTOMY       Family History  Problem Relation Age of Onset  . Heart attack Mother   . Stroke Mother   . Lung cancer Father   . Arthritis Sister      Social History   Socioeconomic History  . Marital status: Married    Spouse name: Not on file  . Number of children: Not on file  . Years of education: Not on file  . Highest education level: Not on file  Occupational History  . Not on file  Social Needs  . Financial resource strain: Not on file  . Food insecurity    Worry: Not on file    Inability: Not on file  . Transportation needs    Medical: Not on file    Non-medical: Not on file  Tobacco Use  . Smoking status: Former Smoker    Types: Cigarettes    Quit date: 02/03/2017    Years since quitting: 2.0  . Smokeless tobacco: Never Used  Substance and Sexual Activity  . Alcohol use: No  . Drug use: No  . Sexual activity: Not on file  Lifestyle  . Physical activity    Days per week: Not on file    Minutes per session: Not on file  . Stress: Not on file  Relationships  . Social Herbalist on phone: Not on file    Gets together: Not on file    Attends religious service: Not on file    Active member of club or organization: Not on file    Attends meetings of clubs or organizations: Not on file    Relationship status: Not on file  . Intimate partner violence    Fear of current or ex partner: Not on file    Emotionally abused: Not on file    Physically abused: Not on file    Forced sexual activity: Not on file  Other Topics Concern  . Not on file  Social History Narrative  . Not on file      Allergies  Allergen Reactions  . Cefzil [Cefprozil] Other (See Comments)    Burning sensation in lower legs.   [name] [Tizanidine Hcl] Nausea And Vomiting     Prior to Admission medications   Medication Sig Start Date End Date Taking? Authorizing Provider  acetaminophen (TYLENOL) 500 MG tablet Take 500 mg every 6 (six) hours as needed by mouth.   Yes [provider]  albuterol (VENTOLIN HFA) 108 (90 Base) MCG/ACT inhaler Inhale 2 puffs into the lungs every 4 (four) hours as needed for wheezing or shortness of breath (cough, shortness of breath or wheezing.). 11/30/18  Yes Stallings, Zoe A, MD  alendronate (FOSAMAX) 70 MG tablet once a week. 01/16/19  Yes [provider]  aspirin EC 81 MG tablet Take 1 tablet (81 mg total) by mouth daily. 11/30/18  Yes Forrest Moron, MD  Fluticasone-Umeclidin-Vilant (TRELEGY ELLIPTA) 100-62.5-25 MCG/INH AEPB Inhale 1 puff into the lungs daily. 12/05/18  Yes Chesley Mires, MD  Fluticasone-Umeclidin-Vilant (TRELEGY ELLIPTA) 100-62.5-25 MCG/INH AEPB Inhale 1 puff into the lungs daily. 02/06/19  Yes Chesley Mires, MD     Depression screen [facility] 2/9 02/14/2019 11/30/2018 07/10/2017 03/08/2017 02/22/2017  Decreased Interest 0 0 0 0 0  Down, Depressed, Hopeless 0 0 0 0 0  PHQ - 2 Score 0 0 0 0 0     Fall Risk  02/14/2019 11/30/2018 07/10/2017 03/08/2017 02/22/2017  Falls in the past year? 0 0 No No No  Number falls in past yr: 0 0 - - -  Injury with Fall? 0 0 - - -  Follow up Falls evaluation completed;Education provided Falls evaluation completed - - -      PHYSICAL EXAM: BP 134/86 Comment: taken from previous  Ht 5\' 2"  (1.575 m)   Wt 142 lb (64.4 kg)   BMI 25.97 kg/m    Wt Readings from Last 3 Encounters:  02/14/19 142 lb (64.4 kg)  02/06/19 147 lb 9.6 oz (67 kg)  12/05/18 142 lb 9.6 oz (64.7 kg)    Medicare annual wellness visit, subsequent    Education/Counseling provided regarding diet and exercise, prevention of  chronic diseases, smoking/tobacco cessation, if applicable, and reviewed "Covered Medicare Preventive Services."

## 2019-02-14 NOTE — Patient Instructions (Signed)
Thank you for taking time to come for your Medicare Wellness Visit. I appreciate your ongoing commitment to your health goals. Please review the following plan we discussed and let me know if I can assist you in the future.  Julie Greer LPN  Preventive Care 70 Years and Older, Female Preventive care refers to lifestyle choices and visits with your health care provider that can promote health and wellness. This includes:  A yearly physical exam. This is also called an annual well check.  Regular dental and eye exams.  Immunizations.  Screening for certain conditions.  Healthy lifestyle choices, such as diet and exercise. What can I expect for my preventive care visit? Physical exam Your health care provider will check:  Height and weight. These may be used to calculate body mass index (BMI), which is a measurement that tells if you are at a healthy weight.  Heart rate and blood pressure.  Your skin for abnormal spots. Counseling Your health care provider may ask you questions about:  Alcohol, tobacco, and drug use.  Emotional well-being.  Home and relationship well-being.  Sexual activity.  Eating habits.  History of falls.  Memory and ability to understand (cognition).  Work and work environment.  Pregnancy and menstrual history. What immunizations do I need?  Influenza (flu) vaccine  This is recommended every year. Tetanus, diphtheria, and pertussis (Tdap) vaccine  You may need a Td booster every 10 years. Varicella (chickenpox) vaccine  You may need this vaccine if you have not already been vaccinated. Zoster (shingles) vaccine  You may need this after age 60. Pneumococcal conjugate (PCV13) vaccine  One dose is recommended after age 70. Pneumococcal polysaccharide (PPSV23) vaccine  One dose is recommended after age 70. Measles, mumps, and rubella (MMR) vaccine  You may need at least one dose of MMR if you were born in 1957 or later. You may also  need a second dose. Meningococcal conjugate (MenACWY) vaccine  You may need this if you have certain conditions. Hepatitis A vaccine  You may need this if you have certain conditions or if you travel or work in places where you may be exposed to hepatitis A. Hepatitis B vaccine  You may need this if you have certain conditions or if you travel or work in places where you may be exposed to hepatitis B. Haemophilus influenzae type b (Hib) vaccine  You may need this if you have certain conditions. You may receive vaccines as individual doses or as more than one vaccine together in one shot (combination vaccines). Talk with your health care provider about the risks and benefits of combination vaccines. What tests do I need? Blood tests  Lipid and cholesterol levels. These may be checked every 5 years, or more frequently depending on your overall health.  Hepatitis C test.  Hepatitis B test. Screening  Lung cancer screening. You may have this screening every year starting at age 55 if you have a 30-pack-year history of smoking and currently smoke or have quit within the past 15 years.  Colorectal cancer screening. All adults should have this screening starting at age 50 and continuing until age 75. Your health care provider may recommend screening at age 45 if you are at increased risk. You will have tests every 1-10 years, depending on your results and the type of screening test.  Diabetes screening. This is done by checking your blood sugar (glucose) after you have not eaten for a while (fasting). You may have this done every 1-3   years.  Mammogram. This may be done every 1-2 years. Talk with your health care provider about how often you should have regular mammograms.  BRCA-related cancer screening. This may be done if you have a family history of breast, ovarian, tubal, or peritoneal cancers. Other tests  Sexually transmitted disease (STD) testing.  Bone density scan. This is done  to screen for osteoporosis. You may have this done starting at age 70. Follow these instructions at home: Eating and drinking  Eat a diet that includes fresh fruits and vegetables, whole grains, lean protein, and low-fat dairy products. Limit your intake of foods with high amounts of sugar, saturated fats, and salt.  Take vitamin and mineral supplements as recommended by your health care provider.  Do not drink alcohol if your health care provider tells you not to drink.  If you drink alcohol: ? Limit how much you have to 0-1 drink a day. ? Be aware of how much alcohol is in your drink. In the [place] one drink equals one 12 oz bottle of beer (355 mL), one 5 oz glass of wine (148 mL), or one 1 oz glass of hard liquor (44 mL). Lifestyle  Take daily care of your teeth and gums.  Stay active. Exercise for at least 30 minutes on 5 or more days each week.  Do not use any products that contain nicotine or tobacco, such as cigarettes, e-cigarettes, and chewing tobacco. If you need help quitting, ask your health care provider.  If you are sexually active, practice safe sex. Use a condom or other form of protection in order to prevent STIs (sexually transmitted infections).  Talk with your health care provider about taking a low-dose aspirin or statin. What's next?  Go to your health care provider once a year for a well check visit.  Ask your health care provider how often you should have your eyes and teeth checked.  Stay up to date on all vaccines. This information is not intended to replace advice given to you by your health care provider. Make sure you discuss any questions you have with your health care provider. Document Released: 03/22/2015 Document Revised: 02/17/2018 Document Reviewed: 02/17/2018 Elsevier Patient Education  2020 [employer].

## 2019-02-15 NOTE — Telephone Encounter (Signed)
Pt's daughter Kimetha Trulson) returning call.  [phone].

## 2019-02-15 NOTE — Telephone Encounter (Signed)
We have already left multiple msgs for [name]  The original msg states that the o2 is covered and this was per [name]  I have called the pt's daughter to see if she has heard anything else from [employer], and to ask if there is anything still needed at this point- NA, and VM was full, [facility]

## 2019-02-15 NOTE — Telephone Encounter (Signed)
Spoke with Marzetta Board and she states that the is still under the impression that the insurance is not covering o2 based on the letter that they received  [facility] [name] again- [facility]

## 2019-02-16 NOTE — Telephone Encounter (Signed)
Spoke with Beth [name] and confirmed that there is no issue with insurance and the o2 is covered  Stacy aware  Nothing further needed

## 2019-02-16 NOTE — Telephone Encounter (Signed)
[name] from [facility] returning call.  [phone].

## 2019-02-16 NOTE — Telephone Encounter (Signed)
LMTCB for Beth [name]  

## 2019-02-20 DIAGNOSIS — J449 Chronic obstructive pulmonary disease, unspecified: Secondary | ICD-10-CM | POA: Diagnosis not present

## 2019-02-20 DIAGNOSIS — R0902 Hypoxemia: Secondary | ICD-10-CM | POA: Diagnosis not present

## 2019-02-24 ENCOUNTER — Telehealth: Payer: Self-pay | Admitting: Pulmonary Disease

## 2019-02-24 DIAGNOSIS — J432 Centrilobular emphysema: Secondary | ICD-10-CM

## 2019-02-24 DIAGNOSIS — J449 Chronic obstructive pulmonary disease, unspecified: Secondary | ICD-10-CM

## 2019-02-24 DIAGNOSIS — J9611 Chronic respiratory failure with hypoxia: Secondary | ICD-10-CM

## 2019-02-24 NOTE — Telephone Encounter (Signed)
Pt is returning call CB# [phone]

## 2019-02-24 NOTE — Telephone Encounter (Signed)
Called the patient back and advise of the results. She confirmed that her DME company is now [employer]now [employer]) and they are currently supplying her oxygen needs during the day.. Patient stated she was contacted regarding the supply company change.  Order placed. Nothing further needed at this time.

## 2019-02-24 NOTE — Telephone Encounter (Signed)
ONO with RA 02/06/19 >> test time 4 hrs 23 min.  Baseline SpO2 86%, low SpO2 74%.  Spent 3 hrs 30 min with SpO2 < 88%.   Please let her know her oxygen level is low at night.  Please send order to have her DME arrange for 2 liters oxygen at night.  She should continue to use 2 liters oxygen during the day with exertion also.

## 2019-02-24 NOTE — Telephone Encounter (Signed)
Unable to reach the patent on her cell or home #. Neither have a voicemail box set up. Will have to try to reach her later.

## 2019-02-28 ENCOUNTER — Ambulatory Visit (HOSPITAL_COMMUNITY)
Admission: RE | Admit: 2019-02-28 | Discharge: 2019-02-28 | Disposition: A | Discharge status: Home / Self Care | Payer: Medicare HMO | Source: Ambulatory Visit | Attending: Acute Care | Admitting: Acute Care

## 2019-02-28 ENCOUNTER — Other Ambulatory Visit: Payer: Self-pay

## 2019-02-28 DIAGNOSIS — Z87891 Personal history of nicotine dependence: Secondary | ICD-10-CM | POA: Insufficient documentation

## 2019-02-28 DIAGNOSIS — Z122 Encounter for screening for malignant neoplasm of respiratory organs: Secondary | ICD-10-CM

## 2019-03-07 DIAGNOSIS — J449 Chronic obstructive pulmonary disease, unspecified: Secondary | ICD-10-CM | POA: Diagnosis not present

## 2019-03-07 DIAGNOSIS — J9611 Chronic respiratory failure with hypoxia: Secondary | ICD-10-CM | POA: Diagnosis not present

## 2019-03-07 DIAGNOSIS — J432 Centrilobular emphysema: Secondary | ICD-10-CM | POA: Diagnosis not present

## 2019-03-13 ENCOUNTER — Other Ambulatory Visit: Payer: Self-pay | Admitting: Family Medicine

## 2019-03-13 DIAGNOSIS — N6489 Other specified disorders of breast: Secondary | ICD-10-CM

## 2019-03-16 ENCOUNTER — Other Ambulatory Visit: Payer: Self-pay | Admitting: *Deleted

## 2019-03-16 DIAGNOSIS — Z87891 Personal history of nicotine dependence: Secondary | ICD-10-CM

## 2019-04-07 DIAGNOSIS — J9611 Chronic respiratory failure with hypoxia: Secondary | ICD-10-CM | POA: Diagnosis not present

## 2019-04-07 DIAGNOSIS — J432 Centrilobular emphysema: Secondary | ICD-10-CM | POA: Diagnosis not present

## 2019-04-07 DIAGNOSIS — J449 Chronic obstructive pulmonary disease, unspecified: Secondary | ICD-10-CM | POA: Diagnosis not present

## 2019-04-13 ENCOUNTER — Ambulatory Visit
Admission: RE | Admit: 2019-04-13 | Discharge: 2019-04-13 | Disposition: A | Discharge status: Home / Self Care | Payer: Medicare HMO | Source: Ambulatory Visit | Attending: Family Medicine | Admitting: Family Medicine

## 2019-04-13 ENCOUNTER — Ambulatory Visit: Payer: Medicare HMO

## 2019-04-13 ENCOUNTER — Other Ambulatory Visit: Payer: Self-pay

## 2019-04-13 DIAGNOSIS — R928 Other abnormal and inconclusive findings on diagnostic imaging of breast: Secondary | ICD-10-CM | POA: Diagnosis not present

## 2019-04-13 DIAGNOSIS — N6489 Other specified disorders of breast: Secondary | ICD-10-CM

## 2019-04-19 IMAGING — DX DG THORACIC SPINE 2V
2 series · 2 of 2 positions shown · non-contrast
Comparison: Chest radiograph January 25, 2017

CLINICAL DATA: Dorsalgia

EXAM:
THORACIC SPINE 2 VIEWS

[t-spine ap]
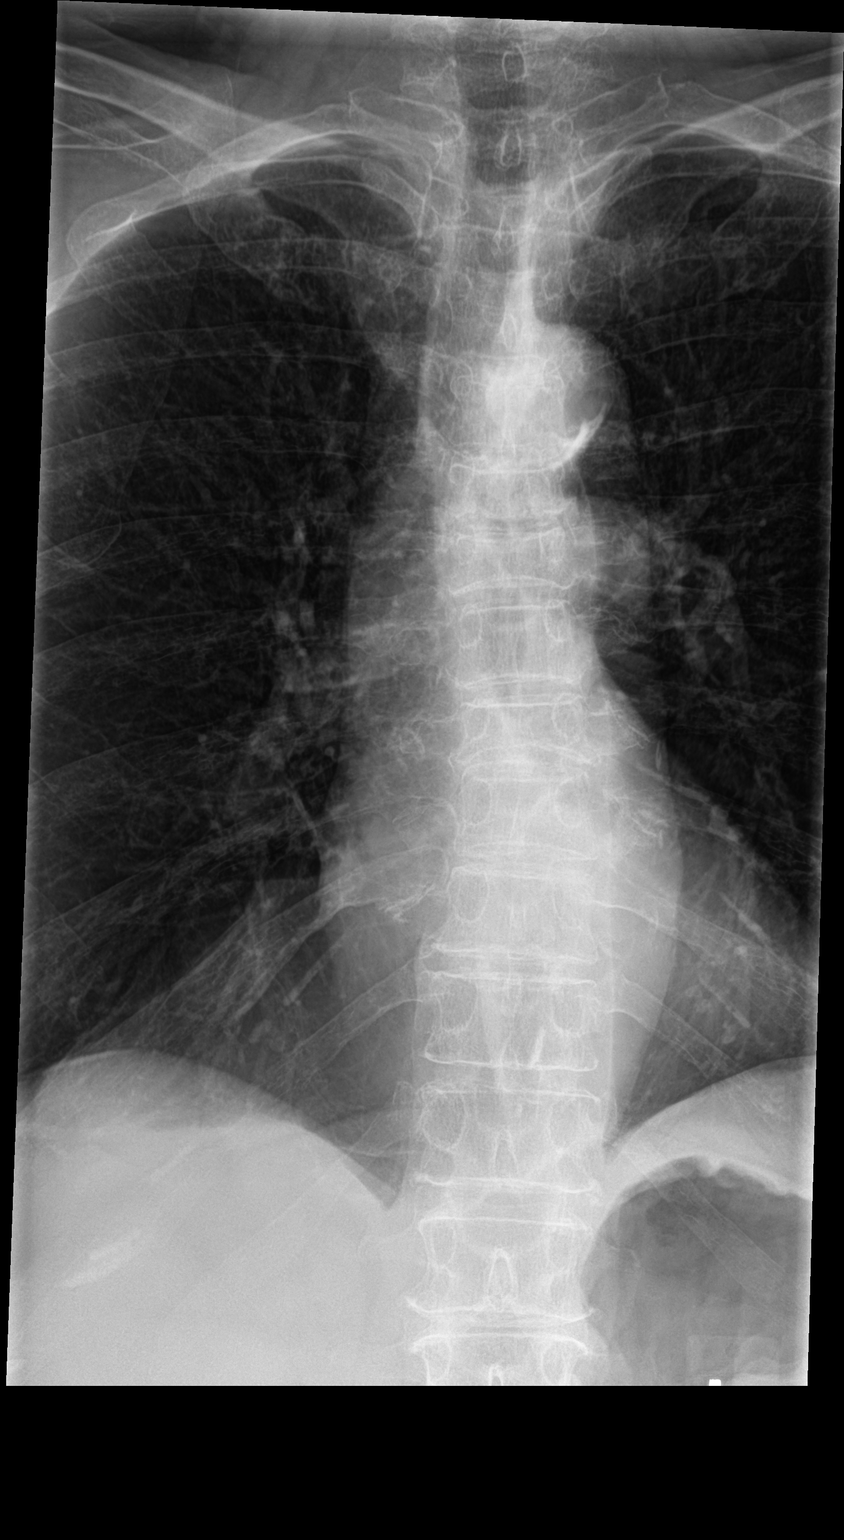

[t-spine lat]
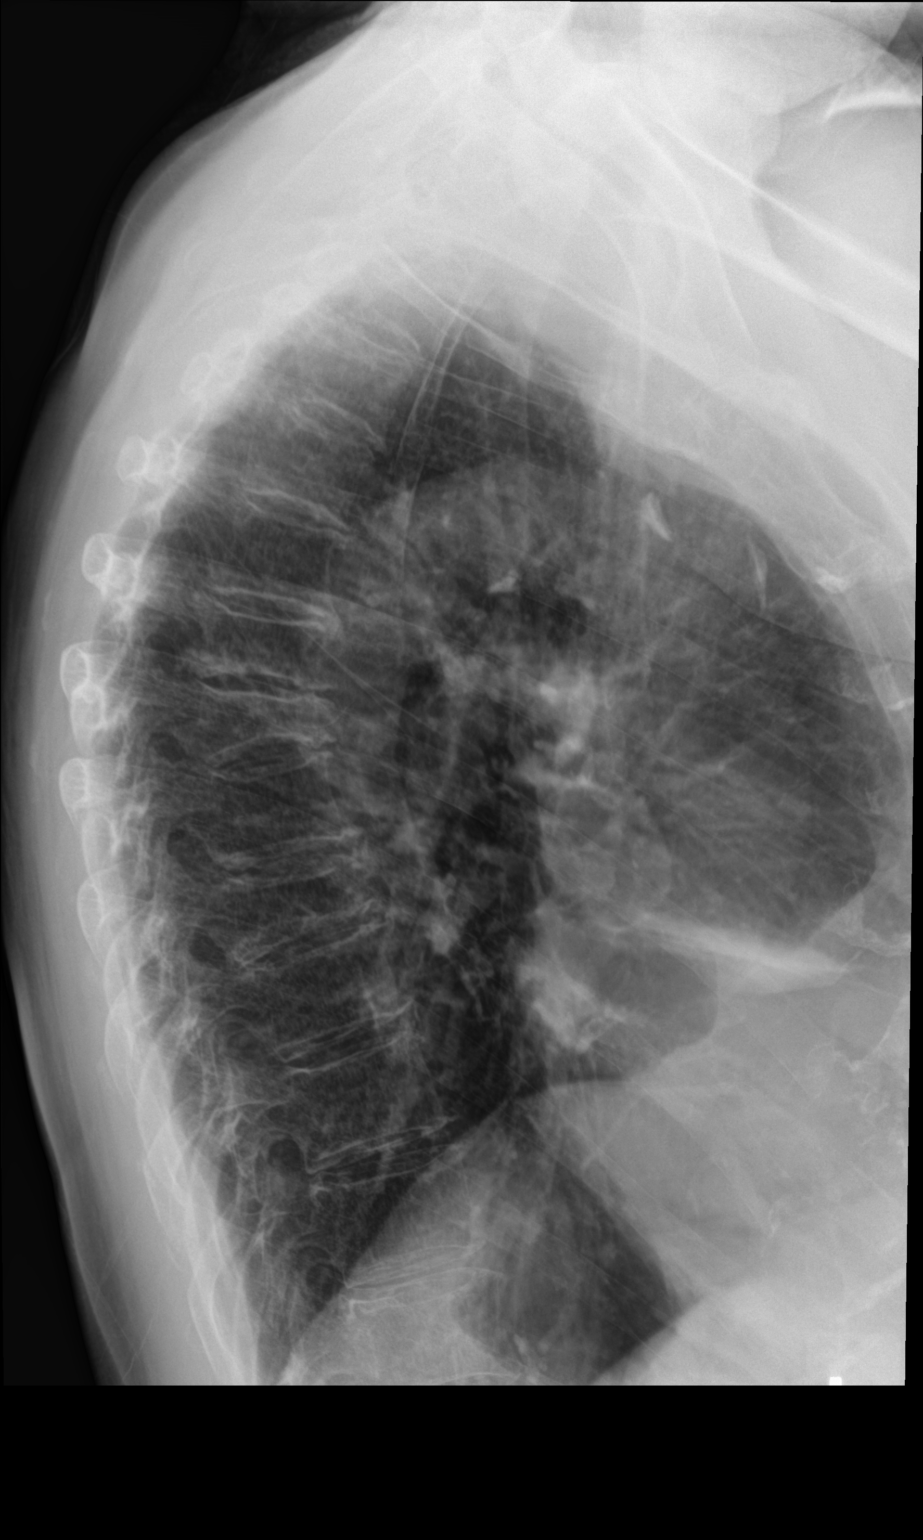

[2 of 2 positions shown; findings below may reference images not displayed]

FINDINGS: Frontal and lateral views were obtained. There is moderately severe
anterior wedging of the T8 vertebral body. There is increase in
anterior wedging at T6. There is moderate wedging of T5, stable. No
other fracture is evident. There is no spondylolisthesis. There is
mild to moderate disc space narrowing at multiple levels. There is
no erosive change or paraspinous lesion. There is aortic
atherosclerosis.
IMPRESSION: Increase in anterior wedging of the T6 vertebral body. Stable
wedging at T5 and T8. No other fractures evident. No
spondylolisthesis. There is osteoarthritic change at several levels.
There is aortic atherosclerosis.

Aortic Atherosclerosis ([id number]).

## 2019-05-04 ENCOUNTER — Encounter: Payer: Self-pay | Admitting: Family Medicine

## 2019-05-07 DIAGNOSIS — J9611 Chronic respiratory failure with hypoxia: Secondary | ICD-10-CM | POA: Diagnosis not present

## 2019-05-07 DIAGNOSIS — J432 Centrilobular emphysema: Secondary | ICD-10-CM | POA: Diagnosis not present

## 2019-05-07 DIAGNOSIS — J449 Chronic obstructive pulmonary disease, unspecified: Secondary | ICD-10-CM | POA: Diagnosis not present

## 2019-05-15 ENCOUNTER — Other Ambulatory Visit: Payer: Self-pay | Admitting: Family Medicine

## 2019-05-15 NOTE — Telephone Encounter (Signed)
Copied from [place] [id number]. Topic: Quick Communication - Rx Refill/Question >> May 15, 2019  3:30 PM Leward Quan A wrote: Medication: alendronate (FOSAMAX) 70 MG tablet  Has the patient contacted their pharmacy? Yes.   (Agent: If no, request that the patient contact the pharmacy for the refill.) (Agent: If yes, when and what did the pharmacy advise?)  Preferred Pharmacy (with phone number or street name): [place], [place] - [id number] [place] [address]  Phone:  [phone] Fax:  [phone]     Agent: Please be advised that RX refills may take up to 3 business days. We ask that you follow-up with your pharmacy.

## 2019-05-15 NOTE — Telephone Encounter (Signed)
Refill for alendronate (FOSAMAX) 70 MG tablet. Prescribed by a historical provider. LR 01/16/2019.

## 2019-05-19 MED ORDER — ALENDRONATE SODIUM 70 MG PO TABS: 70.0000 mg | ORAL_TABLET | ORAL | 0 refills | Status: DC

## 2019-05-22 ENCOUNTER — Ambulatory Visit: Payer: Medicare HMO | Admitting: Family Medicine

## 2019-06-05 DIAGNOSIS — J9611 Chronic respiratory failure with hypoxia: Secondary | ICD-10-CM | POA: Diagnosis not present

## 2019-06-05 DIAGNOSIS — J432 Centrilobular emphysema: Secondary | ICD-10-CM | POA: Diagnosis not present

## 2019-06-05 DIAGNOSIS — J449 Chronic obstructive pulmonary disease, unspecified: Secondary | ICD-10-CM | POA: Diagnosis not present

## 2019-06-22 ENCOUNTER — Encounter: Payer: Self-pay | Admitting: Family Medicine

## 2019-06-22 ENCOUNTER — Telehealth: Payer: Self-pay | Admitting: Pulmonary Disease

## 2019-06-22 MED ORDER — TRELEGY ELLIPTA 100-62.5-25 MCG/INH IN AEPB: 1.0000 | INHALATION_SPRAY | Freq: Every day | RESPIRATORY_TRACT | 5 refills | Status: DC

## 2019-06-22 NOTE — Telephone Encounter (Signed)
Called and spoke with pt letting her know that I would refill her med for her and stated to her to make sure she kept her f/u appt. Pt verbalized understanding. Verified pt's preferred pharmacy and sent rx in for pt. Nothing further needed.

## 2019-06-30 ENCOUNTER — Encounter: Payer: Self-pay | Admitting: Family Medicine

## 2019-06-30 ENCOUNTER — Ambulatory Visit (INDEPENDENT_AMBULATORY_CARE_PROVIDER_SITE_OTHER): Payer: Medicare HMO | Admitting: Family Medicine

## 2019-06-30 ENCOUNTER — Other Ambulatory Visit: Payer: Self-pay

## 2019-06-30 VITALS — BP 197/100 | HR 102 | Temp 97.8°F | Ht 62.0 in | Wt 146.6 lb

## 2019-06-30 DIAGNOSIS — Z23 Encounter for immunization: Secondary | ICD-10-CM

## 2019-06-30 DIAGNOSIS — R030 Elevated blood-pressure reading, without diagnosis of hypertension: Secondary | ICD-10-CM

## 2019-06-30 DIAGNOSIS — E2839 Other primary ovarian failure: Secondary | ICD-10-CM

## 2019-06-30 DIAGNOSIS — J449 Chronic obstructive pulmonary disease, unspecified: Secondary | ICD-10-CM | POA: Diagnosis not present

## 2019-06-30 DIAGNOSIS — I10 Essential (primary) hypertension: Secondary | ICD-10-CM

## 2019-06-30 DIAGNOSIS — M810 Age-related osteoporosis without current pathological fracture: Secondary | ICD-10-CM | POA: Diagnosis not present

## 2019-06-30 DIAGNOSIS — Z5181 Encounter for therapeutic drug level monitoring: Secondary | ICD-10-CM | POA: Diagnosis not present

## 2019-06-30 MED ORDER — ALBUTEROL SULFATE HFA 108 (90 BASE) MCG/ACT IN AERS: 2.0000 | INHALATION_SPRAY | RESPIRATORY_TRACT | 1 refills | Status: DC | PRN

## 2019-06-30 MED ORDER — ALENDRONATE SODIUM 70 MG PO TABS: 70.0000 mg | ORAL_TABLET | ORAL | 3 refills | Status: DC

## 2019-06-30 NOTE — Progress Notes (Signed)
Established Patient Office Visit  Subjective:  Patient ID: Beth [name], female    DOB: 1950-10-23  Age: 69 y.o. MRN: [id number]  CC:  Chief Complaint  Patient presents with  . Follow-up    x41mo for BP    HPI Beth Bortlepresents for   WPhelps DodgeHypertension  Home readings [phone] and [phone] She has verified her blood pressure cuff and also takes her bp readings at different times in the day She states that she is completely fine today and denies any issues with chest pains or palpitations at home.  She denies any changes in her weight She states that she is taking her aspirin and is sticking to a low sodium diet.  She has a history of CAD and COPD but denies wheezing, fatigue or chest pains   Osteoporosis Pt states that she is taking Vitamin D, calcium She is walking for exercise She denies falls She is working on her balance She has not had a repeat bone density since 04/2017 BP Readings from Last 3 Encounters:  06/30/19 (!) 197/100  02/14/19 134/86  02/06/19 134/86    Past Medical History:  Diagnosis Date  . Age-related osteoporosis with current pathological fracture 03/11/2017   [facility] T score -3.1 at Rt total femur  . COPD (chronic obstructive pulmonary disease) ([place]   . Coronary atherosclerosis of native coronary artery 07/22/2017   Seen on chest Ct 07/21/17. Aortic and branch vessel atherosclerosis. Normal heart size, without pericardial effusion. Multivessel coronary artery atherosclerosis.  . Hypertension   . Non-traumatic compression fracture of vertebral column (HCC) 02/18/2017   Chest CT 07/21/17: Moderate T5 and moderate to severe T6 compression deformities w/o significant canal encroachment. Moderate T8 compression deformity is also w/o canal encroachment  . 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 TBenjamine Mola02/2019    Past Surgical History:  Procedure Laterality  Date  . EYE SURGERY    . MASS EXCISION Right 04/12/2017   Procedure: EXCISION OF RIGHT EAR  MASS;  Surgeon: TLeta Baptist MD;  [place] [facility]  Service: ENT;  Laterality: Right; squamous cell cancer with clear margins  . SKIN FULL THICKNESS GRAFT Left 04/12/2017   Procedure: SKIN GRAFT FULL THICKNESS FROM ABDOMEN TO RIGHT EAR;  Surgeon: TLeta Baptist MD;  [place] [facility] [facility]  Service: ENT;  Laterality: Right  . TONSILLECTOMY      Family History  Problem Relation Age of Onset  . Heart attack Mother   . Stroke Mother   . Lung cancer Father   . Arthritis Sister     Social History   Socioeconomic History  . Marital status: Married    Spouse name: Not on file  . Number of children: Not on file  . Years of education: Not on file  . Highest education level: Not on file  Occupational History  . Not on file  Tobacco Use  . Smoking status: Former Smoker    Types: Cigarettes    Quit date: 02/03/2017    Years since quitting: 2.4  . Smokeless tobacco: Never Used  Substance and Sexual Activity  . Alcohol use: No  . Drug use: No  . Sexual activity: Not on file  Other Topics Concern  . Not on file  Social History Narrative  . Not on file   Social Determinants of Health   Financial Resource Strain:   . Difficulty of Paying  Living Expenses:   Food Insecurity:   . Worried About Charity fundraiser in the Last Year:   . Arboriculturist in the Last Year:   Transportation Needs:   . Film/video editor (Medical):   Marland Kitchen Lack of Transportation (Non-Medical):   Physical Activity:   . Days of Exercise per Week:   . Minutes of Exercise per Session:   Stress:   . Feeling of Stress :   Social Connections:   . Frequency of Communication with Friends and Family:   . Frequency of Social Gatherings with Friends and Family:   . Attends Religious Services:   . Active Member of Clubs or Organizations:   . Attends Archivist Meetings:   Marland Kitchen Marital Status:    Intimate Partner Violence:   . Fear of Current or Ex-Partner:   . Emotionally Abused:   Marland Kitchen Physically Abused:   . Sexually Abused:     Outpatient Medications Prior to Visit  Medication Sig Dispense Refill  . acetaminophen (TYLENOL) 500 MG tablet Take 500 mg every 6 (six) hours as needed by mouth.    Marland Kitchen aspirin EC 81 MG tablet Take 1 tablet (81 mg total) by mouth daily. 30 tablet 11  . Fluticasone-Umeclidin-Vilant (TRELEGY ELLIPTA) 100-62.5-25 MCG/INH AEPB Inhale 1 puff into the lungs daily. 2 each 0  . Fluticasone-Umeclidin-Vilant (TRELEGY ELLIPTA) 100-62.5-25 MCG/INH AEPB Inhale 1 puff into the lungs daily. 28 each 5  . albuterol (VENTOLIN HFA) 108 (90 Base) MCG/ACT inhaler Inhale 2 puffs into the lungs every 4 (four) hours as needed for wheezing or shortness of breath (cough, shortness of breath or wheezing.). 18 g 1  . alendronate (FOSAMAX) 70 MG tablet Take 1 tablet (70 mg total) by mouth once a week. 10 tablet 0   No facility-administered medications prior to visit.    Allergies  Allergen Reactions  . Cefzil [Cefprozil] Other (See Comments)    Burning sensation in lower legs.   [name] [Tizanidine Hcl] Nausea And Vomiting    ROS Review of Systems Review of Systems  Constitutional: Negative for activity change, appetite change, chills and fever.  HENT: Negative for congestion, nosebleeds, trouble swallowing and voice change.   Respiratory: Negative for cough, shortness of breath and wheezing.   Gastrointestinal: Negative for diarrhea, nausea and vomiting.  Genitourinary: Negative for difficulty urinating, dysuria, flank pain and hematuria.  Musculoskeletal: Negative for back pain, joint swelling and neck pain.  Neurological: Negative for dizziness, speech difficulty, light-headedness and numbness.  See HPI. All other review of systems negative.     Objective:    Physical Exam  BP (!) 197/100 (BP Location: Left Arm, Patient Position: Sitting, Cuff Size: Normal)    Pulse (!) 102   Temp 97.8 F (36.6 C) (Temporal)   Ht _0  (1.575 m)   Wt 146 lb 9.6 oz (66.5 kg)   SpO2 94%   BMI 26.81 kg/m  Wt Readings from Last 3 Encounters:  06/30/19 146 lb 9.6 oz (66.5 kg)  02/14/19 142 lb (64.4 kg)  02/06/19 147 lb 9.6 oz (67 kg)   Physical Exam  Constitutional: Oriented to person, place, and time. Appears well-developed and well-nourished.  HENT:  Head: Normocephalic and atraumatic.  Eyes: Conjunctivae and EOM are normal.  Cardiovascular: Normal rate, regular rhythm, normal heart sounds and intact distal pulses.  No murmur heard. Pulmonary/Chest: Effort normal and breath sounds normal. No stridor. No respiratory distress. Has no wheezes.  Neurological: Is alert and oriented to  person, place, and time.  Skin: Skin is warm. Capillary refill takes less than 2 seconds.  Psychiatric: Has a normal mood, anxious affect. Behavior is normal. Judgment and thought content normal.    Health Maintenance Due  Topic Date Due  . COVID-19 Vaccine (1) Never done  . TETANUS/TDAP  Never done    There are no preventive care reminders to display for this patient.  Lab Results  Component Value Date   TSH 0.874 11/30/2018   Lab Results  Component Value Date   WBC 7.6 11/30/2018   HGB 15.3 11/30/2018   HCT 46.3 11/30/2018   MCV 92 11/30/2018   PLT 270 11/30/2018   Lab Results  Component Value Date   NA 141 11/30/2018   K 4.3 11/30/2018   CO2 24 11/30/2018   GLUCOSE 99 11/30/2018   BUN 15 11/30/2018   CREATININE 0.74 11/30/2018   BILITOT 0.3 11/30/2018   ALKPHOS 68 11/30/2018   AST 24 11/30/2018   ALT 21 11/30/2018   PROT 7.3 11/30/2018   ALBUMIN 4.7 11/30/2018   CALCIUM 9.6 11/30/2018   ANIONGAP 9 02/24/2015   Lab Results  Component Value Date   CHOL 209 (H) 11/30/2018   Lab Results  Component Value Date   HDL 68 11/30/2018   Lab Results  Component Value Date   LDLCALC 116 (H) 11/30/2018   Lab Results  Component Value Date   TRIG 146  11/30/2018   Lab Results  Component Value Date   CHOLHDL 3.1 11/30/2018   No results found for: HGBA1C    Assessment & Plan:   Problem List Items Addressed This Visit      Cardiovascular and Mediastinum   White coat syndrome with high blood pressure but without hypertension (Chronic)  - Discussed that she should continue home bp monitor No need for meds as her home recordings are reliable and she is visible anxious in the office     Respiratory   Chronic obstructive pulmonary disease (HCC)  -  Refilled albuterol   Relevant Medications   albuterol (VENTOLIN HFA) 108 (90 Base) MCG/ACT inhaler    Other Visit Diagnoses    Age-related osteoporosis without current pathological fracture       Relevant Medications   alendronate (FOSAMAX) 70 MG tablet   Other Relevant Orders   CMP14+EGFR   TSH   DG Bone Density   Estrogen deficiency    - continue bone density plan   Relevant Orders   DG Bone Density   Encounter for medication monitoring       Relevant Orders   DG Bone Density   Essential hypertension    -  Continue meds and lifestyle No need for bp meds at this time as pt has reliable blood pressure readings from home which are low normal.   Relevant Orders   CMP14+EGFR      Meds ordered this encounter  Medications  . alendronate (FOSAMAX) 70 MG tablet    Sig: Take 1 tablet (70 mg total) by mouth once a week.    Dispense:  12 tablet    Refill:  3  . albuterol (VENTOLIN HFA) 108 (90 Base) MCG/ACT inhaler    Sig: Inhale 2 puffs into the lungs every 4 (four) hours as needed for wheezing or shortness of breath (cough, shortness of breath or wheezing.).    Dispense:  18 g    Refill:  1    Follow-up: Return for has appointment with Dr. Pamella Pert.    Chayton Murata  Elsie Lincoln, MD

## 2019-06-30 NOTE — Patient Instructions (Addendum)
We recommend that you schedule a mammogram for breast cancer screening. Typically, you do not need a referral to do this. Please contact a local imaging center to schedule your mammogram.   ([place]) - [phone] or [phone]     If you have lab work done today you will be contacted with your lab results within the next 2 weeks.  If you have not heard from [place] then please contact us. The fastest way to get your results is to register for My Chart.   IF you received an x-ray today, you will receive an invoice from [facility]. Please contact [facility] at [phone] with questions or concerns regarding your invoice.   IF you received labwork today, you will receive an invoice from [facility]. Please contact [facility] at [phone] with questions or concerns regarding your invoice.   Our billing staff will not be able to assist you with questions regarding bills from these companies.  You will be contacted with the lab results as soon as they are available. The fastest way to get your results is to activate your My Chart account. Instructions are located on the last page of this paperwork. If you have not heard from [place] regarding the results in 2 weeks, please contact this office.

## 2019-07-01 LAB — CMP14+EGFR
ALT: 22 IU/L (ref 0–32)
AST: 28 IU/L (ref 0–40)
Albumin/Globulin Ratio: 1.8 (ref 1.2–2.2)
Albumin: 4.6 g/dL (ref 3.7–4.7)
Alkaline Phosphatase: 79 IU/L (ref 39–117)
BUN/Creatinine Ratio: 21 (ref 12–28)
BUN: 16 mg/dL (ref 8–27)
Bilirubin Total: 0.3 mg/dL (ref 0.0–1.2)
CO2: 22 mmol/L (ref 20–29)
Calcium: 9.7 mg/dL (ref 8.7–10.3)
Chloride: 102 mmol/L (ref 96–106)
Creatinine, Ser: 0.75 mg/dL (ref 0.57–1.00)
GFR calc Af Amer: 93 mL/min/{1.73_m2} (ref >59)
GFR calc non Af Amer: 80 mL/min/{1.73_m2} (ref >59)
Globulin, Total: 2.6 g/dL (ref 1.5–4.5)
Glucose: 98 mg/dL (ref 65–99)
Potassium: 4.4 mmol/L (ref 3.5–5.2)
Sodium: 139 mmol/L (ref 134–144)
Total Protein: 7.2 g/dL (ref 6.0–8.5)

## 2019-07-01 LAB — TSH: TSH: 0.71 u[IU]/mL (ref 0.450–4.500)

## 2019-07-06 DIAGNOSIS — J449 Chronic obstructive pulmonary disease, unspecified: Secondary | ICD-10-CM | POA: Diagnosis not present

## 2019-07-06 DIAGNOSIS — J9611 Chronic respiratory failure with hypoxia: Secondary | ICD-10-CM | POA: Diagnosis not present

## 2019-07-06 DIAGNOSIS — J432 Centrilobular emphysema: Secondary | ICD-10-CM | POA: Diagnosis not present

## 2019-07-17 ENCOUNTER — Other Ambulatory Visit: Payer: Self-pay

## 2019-07-17 ENCOUNTER — Ambulatory Visit: Payer: Medicare HMO | Admitting: Pulmonary Disease

## 2019-07-17 ENCOUNTER — Encounter: Payer: Self-pay | Admitting: Pulmonary Disease

## 2019-07-17 VITALS — BP 192/100 | HR 114 | Temp 97.1°F | Ht 62.0 in | Wt 147.0 lb

## 2019-07-17 DIAGNOSIS — J9611 Chronic respiratory failure with hypoxia: Secondary | ICD-10-CM | POA: Diagnosis not present

## 2019-07-17 DIAGNOSIS — I10 Essential (primary) hypertension: Secondary | ICD-10-CM

## 2019-07-17 DIAGNOSIS — J432 Centrilobular emphysema: Secondary | ICD-10-CM | POA: Diagnosis not present

## 2019-07-17 DIAGNOSIS — J449 Chronic obstructive pulmonary disease, unspecified: Secondary | ICD-10-CM | POA: Diagnosis not present

## 2019-07-17 DIAGNOSIS — J439 Emphysema, unspecified: Secondary | ICD-10-CM | POA: Diagnosis not present

## 2019-07-17 NOTE — Progress Notes (Signed)
[facility]  Chief Complaint  Patient presents with  . Follow-up    Patient feels like her breathing is better since last visit. Patient has had a couple bad days but for the most part feels good. Denies cough.     Constitutional:  BP (!) 192/100 (BP Location: Right Arm, Patient Position: Sitting, Cuff Size: Normal)   Pulse (!) 114   Temp (!) 97.1 F (36.2 C) (Temporal)   Ht 5\' 2"  (1.575 m)   Wt 147 lb (66.7 kg)   SpO2 94%   BMI 26.89 kg/m   Past Medical History:  HTN, CAD, Osteoporosis  Brief Summary:  Beth [name] is a 69 y.o. female former smoker with emphysema and right middle lung scarring.  Subjective:   Breathing has been okay.  Occasional cough and wheeze.  Uses albuterol once per week.    Uses oxygen with exertion when she is exercising.  Also using some at night, but cannula falls off.  She has pulse oximeter at home.  Reluctant to get COVID vaccine.  She had low dose CT chest in December 2021.  New 2.8 nodule in RUL, otherwise stable.  She reports her blood pressure reading always rune high in doctors office.  She checks at home and is in normal range.  Repeat BP by me in right arm was 188/90.  Unable to check in Lt arm >> she got scratched by her dog on Lt arm and has bandage in that area.  Physical Exam:   Appearance - well kempt   ENMT - no sinus tenderness, no oral exudate, no LAN, Mallampati 2 airway, no stridor  Respiratory - equal breath sounds bilaterally, no wheezing or rales  CV - s1s2 regular rate and rhythm, no murmurs  Ext - no clubbing, no edema  Skin - no rashes  Psych - normal mood and affect    Assessment/Plan:   COPD with emphysema. - continue trelegy - prn albuterol - she will need booster for pneumovax in 2022  Chronic respiratory failure with hypoxia. - continue 2 liters oxygen at night and with exertion - explained goal SpO2 > 90%  History of tobacco abuse. - f/u low dose CT  chest in December 2022 as part of lung cancer screening  COVID 19 advice. - discussed pro's/con's of COVID vaccines and advised that she should get vaccinated - she will think about this  White coat hypertension. - advised her to monitor her BP at home at different times of the day, and if it remains elevated to f/u with her PCP  A total of  32 minutes spent addressing patient care issues on day of visit.   Follow up:   Patient Instructions  Follow up in 8 months   Signature:  Chesley Mires, MD [place] Pager: [phone] 07/17/2019, 10:27 AM  Flow Sheet     Pulmonary tests:   PFT 11/09/17 >> FEV1 0.71 (35%), FEV1% 41, TLC 5.52 (119%), DLCO 48%, no BD  A1AT 10/04/17 >> 108, MS  PFT 11/09/17 >> FEV1 0.71 (35%), FEV1% 41, TLC 5.52 (119%), RV 3.71 (182%), DLCO 48, no BD  Ambulatory oximetry RA 12/05/18 >> SpO2 88%  ONO with RA 02/06/19 >> test time 4 hrs 23 min.  Baseline SpO2 86%, low SpO2 74%.  Spent 3 hrs 30 min with SpO2 < 88%.  Chest imaging:   CT chest 07/21/17 >> atherosclerosis, bullous emphysema, RML partial collapse  LDCT chest 02/28/19 >> mild/mod emphysema, 2.8 RUL  nodule new, fatty liver, atherosclerosis  Medications:   Allergies as of 07/17/2019      Reactions   Cefzil [cefprozil] Other (See Comments)   Burning sensation in lower legs.    Zanaflex [tizanidine Hcl] Nausea And Vomiting      Medication List       Accurate as of Jul 17, 2019 10:27 AM. If you have any questions, ask your nurse or doctor.        acetaminophen 500 MG tablet Commonly known as: TYLENOL Take 500 mg every 6 (six) hours as needed by mouth.   albuterol 108 (90 Base) MCG/ACT inhaler Commonly known as: VENTOLIN HFA Inhale 2 puffs into the lungs every 4 (four) hours as needed for wheezing or shortness of breath (cough, shortness of breath or wheezing.).   alendronate 70 MG tablet Commonly known as: FOSAMAX Take 1 tablet (70 mg total) by mouth once a  week.   aspirin EC 81 MG tablet Take 1 tablet (81 mg total) by mouth daily.   Trelegy Ellipta 100-62.5-25 MCG/INH Aepb Generic drug: Fluticasone-Umeclidin-Vilant Inhale 1 puff into the lungs daily.   Trelegy Ellipta 100-62.5-25 MCG/INH Aepb Generic drug: Fluticasone-Umeclidin-Vilant Inhale 1 puff into the lungs daily.       Past Surgical History:  She  has a past surgical history that includes Tonsillectomy; Eye surgery; Mass excision (Right, 04/12/2017); and Full thickness skin graft (Left, 04/12/2017).  Family History:  Her family history includes Arthritis in her sister; Heart attack in her mother; Lung cancer in her father; Stroke in her mother.  Social History:  She  reports that she quit smoking about 2 years ago. Her smoking use included cigarettes. She has never used smokeless tobacco. She reports that she does not drink alcohol or use drugs.

## 2019-07-17 NOTE — Patient Instructions (Signed)
Follow up in 8 months

## 2019-08-04 ENCOUNTER — Other Ambulatory Visit: Payer: Self-pay

## 2019-08-04 ENCOUNTER — Encounter: Payer: Self-pay | Admitting: Family Medicine

## 2019-08-04 ENCOUNTER — Telehealth (INDEPENDENT_AMBULATORY_CARE_PROVIDER_SITE_OTHER): Payer: Medicare HMO | Admitting: Family Medicine

## 2019-08-04 DIAGNOSIS — M8000XD Age-related osteoporosis with current pathological fracture, unspecified site, subsequent encounter for fracture with routine healing: Secondary | ICD-10-CM

## 2019-08-04 DIAGNOSIS — J432 Centrilobular emphysema: Secondary | ICD-10-CM

## 2019-08-04 DIAGNOSIS — R030 Elevated blood-pressure reading, without diagnosis of hypertension: Secondary | ICD-10-CM

## 2019-08-04 NOTE — Patient Instructions (Signed)
° ° ° °  If you have lab work done today you will be contacted with your lab results within the next 2 weeks.  If you have not heard from us then please contact us. The fastest way to get your results is to register for My Chart. ° ° °IF you received an x-ray today, you will receive an invoice from [facility]. Please contact [facility] at [phone] with questions or concerns regarding your invoice.  ° °IF you received labwork today, you will receive an invoice from [facility]. Please contact [facility] at [phone] with questions or concerns regarding your invoice.  ° °Our billing staff will not be able to assist you with questions regarding bills from these companies. ° °You will be contacted with the lab results as soon as they are available. The fastest way to get your results is to activate your My Chart account. Instructions are located on the last page of this paperwork. If you have not heard from us regarding the results in 2 weeks, please contact this office. °  ° ° ° °

## 2019-08-04 NOTE — Progress Notes (Signed)
Virtual Visit Note  I connected with patient on 08/04/19 at 454pm by phone (unable to work video) and verified that I am speaking with the correct person using two identifiers. Beth [name] is currently located at home and patient is currently with them during visit. The provider, Rutherford Guys, MD is located in their office at time of visit.  I discussed the limitations, risks, security and privacy concerns of performing an evaluation and management service by telephone and the availability of in person appointments. I also discussed with the patient that there may be a patient responsible charge related to this service. The patient expressed understanding and agreed to proceed.   I provided 13 minutes of non-face-to-face time during this encounter.  CC: establish care  HPI   Previous PCP Dr Nolon Rod Last OV June 30 2019 ? COPD/collapsed right lung, managed by pulm, Dr Halford Chessman, last [place] 07/17/2019, trillegy and albuterol, former smoker, quit 2018, does not use oxygen very often  White coat syndrome - follows dash diet, checks bp at home every day, today her BP was 120/61 her HR 84 Took medication around 2017 - but has not needed for years  Atherosclerosis as seen on CT scan - Denies  h/o CAD or CVA Denies any chest pain, palpitations, edema, SOB Does stationary bike and weights  Lab Results  Component Value Date   CHOL 209 (H) 11/30/2018   HDL 68 11/30/2018   LDLCALC 116 (H) 11/30/2018   TRIG 146 11/30/2018   CHOLHDL 3.1 11/30/2018    Osteoporosis with compression fracture  Takes fosamax, started in jan 2019 after her dexa T score right hip -3.1 Takes calcium and vitamin D gummies Last dexa jan 2019, she will be scheduling next dexa  Allergies  Allergen Reactions  . Cefzil [Cefprozil] Other (See Comments)    Burning sensation in lower legs.   [name] [Tizanidine Hcl] Nausea And Vomiting    Prior to Admission medications   Medication Sig Start Date End Date  Taking? Authorizing Provider  acetaminophen (TYLENOL) 500 MG tablet Take 500 mg every 6 (six) hours as needed by mouth.    [provider]  albuterol (VENTOLIN HFA) 108 (90 Base) MCG/ACT inhaler Inhale 2 puffs into the lungs every 4 (four) hours as needed for wheezing or shortness of breath (cough, shortness of breath or wheezing.). 06/30/19   Forrest Moron, MD  alendronate (FOSAMAX) 70 MG tablet Take 1 tablet (70 mg total) by mouth once a week. 06/30/19   Forrest Moron, MD  aspirin EC 81 MG tablet Take 1 tablet (81 mg total) by mouth daily. 11/30/18   Forrest Moron, MD  Fluticasone-Umeclidin-Vilant (TRELEGY ELLIPTA) 100-62.5-25 MCG/INH AEPB Inhale 1 puff into the lungs daily. 02/06/19   Chesley Mires, MD  Fluticasone-Umeclidin-Vilant (TRELEGY ELLIPTA) 100-62.5-25 MCG/INH AEPB Inhale 1 puff into the lungs daily. 06/22/19   Chesley Mires, MD    Past Medical History:  Diagnosis Date  . Age-related osteoporosis with current pathological fracture 03/11/2017   [facility]A bone scan at [facility] 03/29/2017 T score -3.1 at Rt total femur  . COPD (chronic obstructive pulmonary disease) ([place])   . Coronary atherosclerosis of native coronary artery 07/22/2017   Seen on chest Ct 07/21/17. Aortic and branch vessel atherosclerosis. Normal heart size, without pericardial effusion. Multivessel coronary artery atherosclerosis.  . Hypertension   . Non-traumatic compression fracture of vertebral column (HCC) 02/18/2017   Chest CT 07/21/17: Moderate T5 and moderate to severe T6 compression deformities w/o significant  canal encroachment. Moderate T8 compression deformity is also w/o canal encroachment  . 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 Benjamine Mola 04/2017    Past Surgical History:  Procedure Laterality Date  . EYE SURGERY    . MASS EXCISION Right 04/12/2017   Procedure: EXCISION OF RIGHT EAR  MASS;  Surgeon: Leta Baptist, MD;  Location: [place];  Service:  ENT;  Laterality: Right; squamous cell cancer with clear margins  . SKIN FULL THICKNESS GRAFT Left 04/12/2017   Procedure: SKIN GRAFT FULL THICKNESS FROM ABDOMEN TO RIGHT EAR;  Surgeon: Leta Baptist, MD;  Location: [place];  Service: ENT;  Laterality: Right  . TONSILLECTOMY      Social History   Tobacco Use  . Smoking status: Former Smoker    Types: Cigarettes    Quit date: 02/03/2017    Years since quitting: 2.4  . Smokeless tobacco: Never Used  Substance Use Topics  . Alcohol use: No    Family History  Problem Relation Age of Onset  . Heart attack Mother   . Stroke Mother   . Lung cancer Father   . Arthritis Sister     ROS Per hpi  Objective  Vitals as reported by the patient: per above   ASSESSMENT and PLAN  1. Age-related osteoporosis with current pathological fracture with routine healing, subsequent encounter On fosamax, calcium and vitamin d. Cont with exercises. Patient to schedule dexa.   2. Centrilobular emphysema ([place]) Controlled. Quit smoking. Managed by pulm.   3. White coat syndrome with high blood pressure but without hypertension Home BP readings are normal. Cont with LFM.  FOLLOW-UP: dec 2021 for AWV   The above assessment and management plan was discussed with the patient. The patient verbalized understanding of and has agreed to the management plan. Patient is aware to call the clinic if symptoms persist or worsen. Patient is aware when to return to the clinic for a follow-up visit. Patient educated on when it is appropriate to go to the emergency department.     Rutherford Guys, MD [facility] at [facility] [place], [place] [address] Ph.  [phone] Fax [phone]

## 2019-08-05 DIAGNOSIS — J449 Chronic obstructive pulmonary disease, unspecified: Secondary | ICD-10-CM | POA: Diagnosis not present

## 2019-08-05 DIAGNOSIS — J9611 Chronic respiratory failure with hypoxia: Secondary | ICD-10-CM | POA: Diagnosis not present

## 2019-08-05 DIAGNOSIS — J432 Centrilobular emphysema: Secondary | ICD-10-CM | POA: Diagnosis not present

## 2019-09-05 DIAGNOSIS — J432 Centrilobular emphysema: Secondary | ICD-10-CM | POA: Diagnosis not present

## 2019-09-05 DIAGNOSIS — J449 Chronic obstructive pulmonary disease, unspecified: Secondary | ICD-10-CM | POA: Diagnosis not present

## 2019-09-05 DIAGNOSIS — J9611 Chronic respiratory failure with hypoxia: Secondary | ICD-10-CM | POA: Diagnosis not present

## 2019-10-05 DIAGNOSIS — J9611 Chronic respiratory failure with hypoxia: Secondary | ICD-10-CM | POA: Diagnosis not present

## 2019-10-05 DIAGNOSIS — J432 Centrilobular emphysema: Secondary | ICD-10-CM | POA: Diagnosis not present

## 2019-10-05 DIAGNOSIS — J449 Chronic obstructive pulmonary disease, unspecified: Secondary | ICD-10-CM | POA: Diagnosis not present

## 2019-11-05 DIAGNOSIS — J449 Chronic obstructive pulmonary disease, unspecified: Secondary | ICD-10-CM | POA: Diagnosis not present

## 2019-11-05 DIAGNOSIS — J432 Centrilobular emphysema: Secondary | ICD-10-CM | POA: Diagnosis not present

## 2019-11-05 DIAGNOSIS — J9611 Chronic respiratory failure with hypoxia: Secondary | ICD-10-CM | POA: Diagnosis not present

## 2019-11-07 ENCOUNTER — Other Ambulatory Visit: Payer: Self-pay

## 2019-11-07 ENCOUNTER — Ambulatory Visit
Admission: RE | Admit: 2019-11-07 | Discharge: 2019-11-07 | Disposition: A | Discharge status: Home / Self Care | Payer: Medicare HMO | Source: Ambulatory Visit | Attending: Family Medicine | Admitting: Family Medicine

## 2019-11-07 DIAGNOSIS — Z5181 Encounter for therapeutic drug level monitoring: Secondary | ICD-10-CM

## 2019-11-07 DIAGNOSIS — Z780 Asymptomatic menopausal state: Secondary | ICD-10-CM | POA: Diagnosis not present

## 2019-11-07 DIAGNOSIS — M8588 Other specified disorders of bone density and structure, other site: Secondary | ICD-10-CM | POA: Diagnosis not present

## 2019-11-07 DIAGNOSIS — E2839 Other primary ovarian failure: Secondary | ICD-10-CM

## 2019-11-07 DIAGNOSIS — M810 Age-related osteoporosis without current pathological fracture: Secondary | ICD-10-CM

## 2019-12-06 DIAGNOSIS — J9611 Chronic respiratory failure with hypoxia: Secondary | ICD-10-CM | POA: Diagnosis not present

## 2019-12-06 DIAGNOSIS — J449 Chronic obstructive pulmonary disease, unspecified: Secondary | ICD-10-CM | POA: Diagnosis not present

## 2019-12-06 DIAGNOSIS — J432 Centrilobular emphysema: Secondary | ICD-10-CM | POA: Diagnosis not present

## 2019-12-25 ENCOUNTER — Other Ambulatory Visit: Payer: Self-pay | Admitting: Pulmonary Disease

## 2020-01-05 DIAGNOSIS — J9611 Chronic respiratory failure with hypoxia: Secondary | ICD-10-CM | POA: Diagnosis not present

## 2020-01-05 DIAGNOSIS — J449 Chronic obstructive pulmonary disease, unspecified: Secondary | ICD-10-CM | POA: Diagnosis not present

## 2020-01-05 DIAGNOSIS — J432 Centrilobular emphysema: Secondary | ICD-10-CM | POA: Diagnosis not present

## 2020-01-19 ENCOUNTER — Encounter: Payer: Self-pay | Admitting: Acute Care

## 2020-01-26 ENCOUNTER — Telehealth: Payer: Self-pay | Admitting: Pulmonary Disease

## 2020-01-26 NOTE — Telephone Encounter (Signed)
Spoke to patient, who stated that she is returning a call from [place] to schedule CT low dose.   Rodena Piety, please advise. Thanks

## 2020-01-26 NOTE — Telephone Encounter (Signed)
I have spoken with Beth [name] and she is aware that her LCS CT has been scheduled at [facility] on 02/29/2020 @ 10:00am

## 2020-02-05 DIAGNOSIS — J9611 Chronic respiratory failure with hypoxia: Secondary | ICD-10-CM | POA: Diagnosis not present

## 2020-02-05 DIAGNOSIS — J432 Centrilobular emphysema: Secondary | ICD-10-CM | POA: Diagnosis not present

## 2020-02-05 DIAGNOSIS — J449 Chronic obstructive pulmonary disease, unspecified: Secondary | ICD-10-CM | POA: Diagnosis not present

## 2020-02-06 ENCOUNTER — Telehealth: Payer: Self-pay | Admitting: *Deleted

## 2020-02-06 NOTE — Telephone Encounter (Signed)
No voicemail  Schedule AWV

## 2020-02-29 ENCOUNTER — Ambulatory Visit (HOSPITAL_COMMUNITY)
Admission: RE | Admit: 2020-02-29 | Discharge: 2020-02-29 | Disposition: A | Discharge status: Home / Self Care | Payer: Medicare HMO | Source: Ambulatory Visit | Attending: Acute Care | Admitting: Acute Care

## 2020-02-29 ENCOUNTER — Other Ambulatory Visit: Payer: Self-pay

## 2020-02-29 DIAGNOSIS — Z87891 Personal history of nicotine dependence: Secondary | ICD-10-CM | POA: Insufficient documentation

## 2020-03-06 DIAGNOSIS — J9611 Chronic respiratory failure with hypoxia: Secondary | ICD-10-CM | POA: Diagnosis not present

## 2020-03-06 DIAGNOSIS — J432 Centrilobular emphysema: Secondary | ICD-10-CM | POA: Diagnosis not present

## 2020-03-06 DIAGNOSIS — J449 Chronic obstructive pulmonary disease, unspecified: Secondary | ICD-10-CM | POA: Diagnosis not present

## 2020-03-07 ENCOUNTER — Other Ambulatory Visit: Payer: Self-pay | Admitting: Family Medicine

## 2020-03-07 DIAGNOSIS — Z1231 Encounter for screening mammogram for malignant neoplasm of breast: Secondary | ICD-10-CM

## 2020-03-12 ENCOUNTER — Other Ambulatory Visit: Payer: Self-pay | Admitting: *Deleted

## 2020-03-12 DIAGNOSIS — Z87891 Personal history of nicotine dependence: Secondary | ICD-10-CM

## 2020-03-12 NOTE — Progress Notes (Signed)
Please call patient and let them  know their  low dose Ct was read as a Lung RADS 2: nodules that are benign in appearance and behavior with a very low likelihood of becoming a clinically active cancer due to size or lack of growth. Recommendation per radiology is for a repeat LDCT in 12 months.  .Please let them  know we will order and schedule their  annual screening scan for 12/ 2023. Please let them  know there was notation of CAD on their  scan.  Please remind the patient  that this is a non-gated exam therefore degree or severity of disease  cannot be determined. Please have them  follow up with their PCP regarding potential risk factor modification, dietary therapy or pharmacologic therapy if clinically indicated. Pt.  is not  currently on statin therapy per Epic. Please place order for annual  screening scan for  02/2022 and fax results to PCP. Thanks so much.  Pt is not followed by cardiology . Please have her follow up with her PCP about need for referral. Thanks

## 2020-03-20 ENCOUNTER — Encounter: Payer: Self-pay | Admitting: Family Medicine

## 2020-03-20 ENCOUNTER — Other Ambulatory Visit: Payer: Self-pay

## 2020-03-20 ENCOUNTER — Ambulatory Visit (INDEPENDENT_AMBULATORY_CARE_PROVIDER_SITE_OTHER): Payer: Medicare HMO | Admitting: Family Medicine

## 2020-03-20 VITALS — BP 201/103 | HR 110 | Temp 98.1°F | Ht 62.0 in | Wt 146.0 lb

## 2020-03-20 DIAGNOSIS — R030 Elevated blood-pressure reading, without diagnosis of hypertension: Secondary | ICD-10-CM

## 2020-03-20 DIAGNOSIS — H9313 Tinnitus, bilateral: Secondary | ICD-10-CM | POA: Diagnosis not present

## 2020-03-20 DIAGNOSIS — M8000XD Age-related osteoporosis with current pathological fracture, unspecified site, subsequent encounter for fracture with routine healing: Secondary | ICD-10-CM | POA: Diagnosis not present

## 2020-03-20 DIAGNOSIS — I2510 Atherosclerotic heart disease of native coronary artery without angina pectoris: Secondary | ICD-10-CM | POA: Diagnosis not present

## 2020-03-20 DIAGNOSIS — J9611 Chronic respiratory failure with hypoxia: Secondary | ICD-10-CM

## 2020-03-20 DIAGNOSIS — Z7185 Encounter for immunization safety counseling: Secondary | ICD-10-CM | POA: Diagnosis not present

## 2020-03-20 DIAGNOSIS — J432 Centrilobular emphysema: Secondary | ICD-10-CM | POA: Diagnosis not present

## 2020-03-20 MED ORDER — HYDROCHLOROTHIAZIDE 25 MG PO TABS: 25.0000 mg | ORAL_TABLET | Freq: Every day | ORAL | 3 refills | Status: DC

## 2020-03-20 MED ORDER — ROSUVASTATIN CALCIUM 10 MG PO TABS: 10.0000 mg | ORAL_TABLET | Freq: Every day | ORAL | 3 refills | Status: DC

## 2020-03-20 NOTE — Patient Instructions (Addendum)
Bring home BP to next appointment  Starting Crestor and HCTZ  Health Maintenance After Age 72 After age 45, you are at a higher risk for certain long-term diseases and infections as well as injuries from falls. Falls are a major cause of broken bones and head injuries in people who are older than age 4. Getting regular preventive care can help to keep you healthy and well. Preventive care includes getting regular testing and making lifestyle changes as recommended by your health care provider. Talk with your health care provider about:  Which screenings and tests you should have. A screening is a test that checks for a disease when you have no symptoms.  A diet and exercise plan that is right for you. What should I know about screenings and tests to prevent falls? Screening and testing are the best ways to find a health problem early. Early diagnosis and treatment give you the best chance of managing medical conditions that are common after age 32. Certain conditions and lifestyle choices may make you more likely to have a fall. Your health care provider may recommend:  Regular vision checks. Poor vision and conditions such as cataracts can make you more likely to have a fall. If you wear glasses, make sure to get your prescription updated if your vision changes.  Medicine review. Work with your health care provider to regularly review all of the medicines you are taking, including over-the-counter medicines. Ask your health care provider about any side effects that may make you more likely to have a fall. Tell your health care provider if any medicines that you take make you feel dizzy or sleepy.  Osteoporosis screening. Osteoporosis is a condition that causes the bones to get weaker. This can make the bones weak and cause them to break more easily.  Blood pressure screening. Blood pressure changes and medicines to control blood pressure can make you feel dizzy.  Strength and balance checks.  Your health care provider may recommend certain tests to check your strength and balance while standing, walking, or changing positions.  Foot health exam. Foot pain and numbness, as well as not wearing proper footwear, can make you more likely to have a fall.  Depression screening. You may be more likely to have a fall if you have a fear of falling, feel emotionally low, or feel unable to do activities that you used to do.  Alcohol use screening. Using too much alcohol can affect your balance and may make you more likely to have a fall. What actions can I take to lower my risk of falls? General instructions  Talk with your health care provider about your risks for falling. Tell your health care provider if: ? You fall. Be sure to tell your health care provider about all falls, even ones that seem minor. ? You feel dizzy, sleepy, or off-balance.  Take over-the-counter and prescription medicines only as told by your health care provider. These include any supplements.  Eat a healthy diet and maintain a healthy weight. A healthy diet includes low-fat dairy products, low-fat (lean) meats, and fiber from whole grains, beans, and lots of fruits and vegetables. Home safety  Remove any tripping hazards, such as rugs, cords, and clutter.  Install safety equipment such as grab bars in bathrooms and safety rails on stairs.  Keep rooms and walkways well-lit. Activity  Follow a regular exercise program to stay fit. This will help you maintain your balance. Ask your health care provider what types of exercise  are appropriate for you.  If you need a cane or walker, use it as recommended by your health care provider.  Wear supportive shoes that have nonskid soles.   Lifestyle  Do not drink alcohol if your health care provider tells you not to drink.  If you drink alcohol, limit how much you have: ? 0-1 drink a day for women. ? 0-2 drinks a day for men.  Be aware of how much alcohol is in your  drink. In the [place] one drink equals one typical bottle of beer (12 oz), one-half glass of wine (5 oz), or one shot of hard liquor (1 oz).  Do not use any products that contain nicotine or tobacco, such as cigarettes and e-cigarettes. If you need help quitting, ask your health care provider. Summary  Having a healthy lifestyle and getting preventive care can help to protect your health and wellness after age 73.  Screening and testing are the best way to find a health problem early and help you avoid having a fall. Early diagnosis and treatment give you the best chance for managing medical conditions that are more common for people who are older than age 70.  Falls are a major cause of broken bones and head injuries in people who are older than age 53. Take precautions to prevent a fall at home.  Work with your health care provider to learn what changes you can make to improve your health and wellness and to prevent falls. This information is not intended to replace advice given to you by your health care provider. Make sure you discuss any questions you have with your health care provider. Document Revised: 06/16/2018 Document Reviewed: 01/06/2017 Elsevier Patient Education  2021 [employer].   If you have lab work done today you will be contacted with your lab results within the next 2 weeks.  If you have not heard from [place] then please contact us. The fastest way to get your results is to register for My Chart.   IF you received an x-ray today, you will receive an invoice from [facility]. Please contact [facility] at [phone] with questions or concerns regarding your invoice.   IF you received labwork today, you will receive an invoice from [facility]. Please contact [facility] at [phone] with questions or concerns regarding your invoice.   Our billing staff will not be able to assist you with questions regarding bills from these companies.  You will be  contacted with the lab results as soon as they are available. The fastest way to get your results is to activate your My Chart account. Instructions are located on the last page of this paperwork. If you have not heard from [place] regarding the results in 2 weeks, please contact this office.

## 2020-03-20 NOTE — Progress Notes (Signed)
1/12/202210:36 AM  Beth [name] 1950/03/27, 70 y.o., female [id number]  Chief Complaint  Patient presents with  . Transitions Of Care  . Hypertension    Pt reports having white coat syndrome home readings 130/70's    HPI:   Patient is a 70 y.o. female with past medical history significant for COPD, CAD, osteoporosis who presents today for toc.  Patient Care Team: Raejean Swinford, Laurita Quint, FNP as PCP - General (Family Medicine) Leta Baptist, MD as Consulting Physician (Otolaryngology) Murlean Iba, MD as Referring Physician (Orthopedic Surgery) Chesley Mires, MD as Consulting Physician (Pulmonary Disease)  Originally from [place] to [place] since 38 Lives with husband, daughter and grandson  HTN BP is always high in clinic Previous providers verbalize as white coat htn Discussed the importance of bringing in home cuff and follow up  BP Readings from Last 3 Encounters:  03/20/20 (!) 201/103  07/17/19 (!) 192/100  06/30/19 (!) 197/100   Osteoporosis Fosamax daily Dexa:11/07/19  COPD Managed by pulmonology Albuterol (once a week), Trelegy  COVID: declines at this time  Health Maintenance  Topic Date Due  . COVID-19 Vaccine (1) Never done  . Fecal DNA (Cologuard)  02/28/2021  . MAMMOGRAM  04/12/2021  . TETANUS/TDAP  11/26/2029  . INFLUENZA VACCINE  Completed  . DEXA SCAN  Completed  . Hepatitis C Screening  Completed  . PNA vac Low Risk Adult  Completed     Depression screen [facility] 2/9 06/30/2019 02/14/2019 11/30/2018  Decreased Interest 0 0 0  Down, Depressed, Hopeless 0 0 0  PHQ - 2 Score 0 0 0    Fall Risk  03/20/2020 06/30/2019 02/14/2019 11/30/2018 07/10/2017  Falls in the past year? 0 0 0 0 No  Number falls in past yr: 0 0 0 0 -  Injury with Fall? 0 0 0 0 -  Follow up Falls evaluation completed Falls evaluation completed Falls evaluation completed;Education provided Falls evaluation completed -     Allergies  Allergen Reactions  . Cefzil  [Cefprozil] Other (See Comments)    Burning sensation in lower legs.   [name] [Tizanidine Hcl] Nausea And Vomiting    Prior to Admission medications   Medication Sig Start Date End Date Taking? Authorizing Provider  acetaminophen (TYLENOL) 500 MG tablet Take 500 mg every 6 (six) hours as needed by mouth.   Yes [provider]  albuterol (VENTOLIN HFA) 108 (90 Base) MCG/ACT inhaler Inhale 2 puffs into the lungs every 4 (four) hours as needed for wheezing or shortness of breath (cough, shortness of breath or wheezing.). 06/30/19  Yes Stallings, Zoe A, MD  alendronate (FOSAMAX) 70 MG tablet Take 1 tablet (70 mg total) by mouth once a week. 06/30/19  Yes Forrest Moron, MD  aspirin EC 81 MG tablet Take 1 tablet (81 mg total) by mouth daily. 11/30/18  Yes Stallings, Zoe A, MD  Fluticasone-Umeclidin-Vilant (TRELEGY ELLIPTA) 100-62.5-25 MCG/INH AEPB Inhale 1 puff into the lungs daily. 02/06/19  Yes Chesley Mires, MD    Past Medical History:  Diagnosis Date  . Age-related osteoporosis with current pathological fracture 03/11/2017   [facility] 03/29/2017 T score -3.1 at Rt total femur  . COPD (chronic obstructive pulmonary disease) ([place])   . Coronary atherosclerosis of native coronary artery 07/22/2017   Seen on chest Ct 07/21/17. Aortic and branch vessel atherosclerosis. Normal heart size, without pericardial effusion. Multivessel coronary artery atherosclerosis.  . Hypertension   . Non-traumatic compression fracture of vertebral  column ([place]) 02/18/2017   Chest CT 07/21/17: Moderate T5 and moderate to severe T6 compression deformities w/o significant canal encroachment. Moderate T8 compression deformity is also w/o canal encroachment  . 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 Benjamine Mola 04/2017    Past Surgical History:  Procedure Laterality Date  . EYE SURGERY    . MASS EXCISION Right 04/12/2017   Procedure: EXCISION OF RIGHT EAR  MASS;   Surgeon: Leta Baptist, MD;  Location: [facility];  Service: ENT;  Laterality: Right; squamous cell cancer with clear margins  . SKIN FULL THICKNESS GRAFT Left 04/12/2017   Procedure: SKIN GRAFT FULL THICKNESS FROM ABDOMEN TO RIGHT EAR;  Surgeon: Leta Baptist, MD;  Location: [place];  Service: ENT;  Laterality: Right  . TONSILLECTOMY      Social History   Tobacco Use  . Smoking status: Former Smoker    Types: Cigarettes    Quit date: 02/03/2017    Years since quitting: 3.1  . Smokeless tobacco: Never Used  Substance Use Topics  . Alcohol use: No    Family History  Problem Relation Age of Onset  . Heart attack Mother   . Stroke Mother   . Lung cancer Father   . Arthritis Sister     Review of Systems  Constitutional: Negative for chills, fever and malaise/fatigue.  HENT: Positive for tinnitus (bilateral). Negative for hearing loss.   Eyes: Negative for blurred vision and double vision.       Cataracts: sees opthomology  Respiratory: Negative for cough, shortness of breath and wheezing.   Cardiovascular: Negative for chest pain, palpitations and leg swelling.  Gastrointestinal: Negative for abdominal pain, blood in stool, constipation, diarrhea, heartburn, nausea and vomiting.  Genitourinary: Negative for dysuria, frequency, hematuria and urgency.  Musculoskeletal: Negative for back pain and joint pain.  Skin: Negative for rash.  Neurological: Positive for headaches (one every couple of months: tylenol or aleve). Negative for dizziness and weakness.     OBJECTIVE:  Today's Vitals   03/20/20 0947  BP: (!) 201/103  Pulse: (!) 110  Temp: 98.1 F (36.7 C)  SpO2: 93%  Weight: 146 lb (66.2 kg)  Height: '5\' 2"'  (1.575 m)   Body mass index is 26.7 kg/m.   Physical Exam Constitutional:      General: She is not in acute distress.    Appearance: Normal appearance. She is not ill-appearing.  HENT:     Head: Normocephalic.     Right Ear: Tympanic  membrane, ear canal and external ear normal. There is no impacted cerumen.     Left Ear: Tympanic membrane, ear canal and external ear normal. There is no impacted cerumen.  Cardiovascular:     Rate and Rhythm: Normal rate and regular rhythm.     Pulses: Normal pulses.     Heart sounds: Normal heart sounds. No murmur heard. No friction rub. No gallop.   Pulmonary:     Effort: Pulmonary effort is normal. No respiratory distress.     Breath sounds: Normal breath sounds. No stridor. No wheezing, rhonchi or rales.  Abdominal:     General: Bowel sounds are normal.     Palpations: Abdomen is soft.     Tenderness: There is no abdominal tenderness.  Musculoskeletal:     Right lower leg: No edema.     Left lower leg: No edema.  Skin:    General: Skin is warm and dry.  Neurological:  Mental Status: She is alert and oriented to person, place, and time.  Psychiatric:        Mood and Affect: Mood normal.        Behavior: Behavior normal.     No results found for this or any previous visit (from the past 24 hour(s)).  No results found.   ASSESSMENT and PLAN  Problem List Items Addressed This Visit      Cardiovascular and Mediastinum   White coat syndrome with high blood pressure but without hypertension (Chronic)   Relevant Medications   hydrochlorothiazide (HYDRODIURIL) 25 MG tablet   rosuvastatin (CRESTOR) 10 MG tablet   Other Relevant Orders   CMP14+EGFR   Hemoglobin A1c   Coronary atherosclerosis of native coronary artery   Relevant Medications   hydrochlorothiazide (HYDRODIURIL) 25 MG tablet   rosuvastatin (CRESTOR) 10 MG tablet   Other Relevant Orders   Lipid Panel     Respiratory   Chronic obstructive pulmonary disease (HCC)   Chronic respiratory failure with hypoxia (HCC)     Musculoskeletal and Integument   Age-related osteoporosis with current pathological fracture - Primary   Relevant Orders   Vitamin D, 25-hydroxy   CBC    Other Visit Diagnoses     Encounter for immunization safety counseling       Tinnitus of both ears       Relevant Orders   Ambulatory referral to Audiology    Plan  Start HCTZ, f/u in 1 week with her bringing home BP cuff to compare  Start Crestor daily  Will follow up with labs  Referral placed to audiology  R/se/b of medications discussed  RTC/ED precautions discussed   Return in about 1 week (around 03/27/2020) for Medication follow up and BP recheck.    Huston Foley Mildreth Reek, FNP-BC Primary Care at [place], [place] [address] Ph.  [phone] Fax [phone]

## 2020-03-21 LAB — LIPID PANEL
Chol/HDL Ratio: 3.3 ratio (ref 0.0–4.4)
Cholesterol, Total: 190 mg/dL (ref 100–199)
HDL: 57 mg/dL (ref >39)
LDL Chol Calc (NIH): 112 mg/dL — ABNORMAL HIGH (ref 0–99)
Triglycerides: 119 mg/dL (ref 0–149)
VLDL Cholesterol Cal: 21 mg/dL (ref 5–40)

## 2020-03-21 LAB — CMP14+EGFR
ALT: 22 IU/L (ref 0–32)
AST: 27 IU/L (ref 0–40)
Albumin/Globulin Ratio: 1.5 (ref 1.2–2.2)
Albumin: 4.6 g/dL (ref 3.7–4.7)
Alkaline Phosphatase: 73 IU/L (ref 44–121)
BUN/Creatinine Ratio: 20 (ref 12–28)
BUN: 17 mg/dL (ref 8–27)
Bilirubin Total: 0.4 mg/dL (ref 0.0–1.2)
CO2: 24 mmol/L (ref 20–29)
Calcium: 9.7 mg/dL (ref 8.7–10.3)
Chloride: 102 mmol/L (ref 96–106)
Creatinine, Ser: 0.86 mg/dL (ref 0.57–1.00)
GFR calc Af Amer: 79 mL/min/{1.73_m2} (ref >59)
GFR calc non Af Amer: 68 mL/min/{1.73_m2} (ref >59)
Globulin, Total: 3 g/dL (ref 1.5–4.5)
Glucose: 107 mg/dL — ABNORMAL HIGH (ref 65–99)
Potassium: 4.8 mmol/L (ref 3.5–5.2)
Sodium: 141 mmol/L (ref 134–144)
Total Protein: 7.6 g/dL (ref 6.0–8.5)

## 2020-03-21 LAB — CBC
Hematocrit: 45.2 % (ref 34.0–46.6)
Hemoglobin: 15.5 g/dL (ref 11.1–15.9)
MCH: 30.9 pg (ref 26.6–33.0)
MCHC: 34.3 g/dL (ref 31.5–35.7)
MCV: 90 fL (ref 79–97)
Platelets: 266 10*3/uL (ref 150–450)
RBC: 5.01 x10E6/uL (ref 3.77–5.28)
RDW: 12.4 % (ref 11.7–15.4)
WBC: 6.7 10*3/uL (ref 3.4–10.8)

## 2020-03-21 LAB — HEMOGLOBIN A1C
Est. average glucose Bld gHb Est-mCnc: 128 mg/dL
Hgb A1c MFr Bld: 6.1 % — ABNORMAL HIGH (ref 4.8–5.6)

## 2020-03-21 LAB — VITAMIN D 25 HYDROXY (VIT D DEFICIENCY, FRACTURES): Vit D, 25-Hydroxy: 58.7 ng/mL (ref 30.0–100.0)

## 2020-03-27 ENCOUNTER — Ambulatory Visit: Payer: Medicare HMO | Admitting: Family Medicine

## 2020-04-04 ENCOUNTER — Ambulatory Visit (INDEPENDENT_AMBULATORY_CARE_PROVIDER_SITE_OTHER): Payer: Medicare HMO | Admitting: Family Medicine

## 2020-04-04 ENCOUNTER — Encounter: Payer: Self-pay | Admitting: Family Medicine

## 2020-04-04 ENCOUNTER — Other Ambulatory Visit: Payer: Self-pay

## 2020-04-04 VITALS — BP 152/78 | HR 99 | Temp 97.3°F | Ht 62.0 in | Wt 145.0 lb

## 2020-04-04 DIAGNOSIS — I10 Essential (primary) hypertension: Secondary | ICD-10-CM

## 2020-04-04 DIAGNOSIS — I2510 Atherosclerotic heart disease of native coronary artery without angina pectoris: Secondary | ICD-10-CM

## 2020-04-04 NOTE — Progress Notes (Signed)
1/27/20229:41 AM  Beth [name] 12-14-1950, 70 y.o., female [id number]  Chief Complaint  Patient presents with  . Hypertension    Follow up , has home readings    HPI:   Patient is a 70 y.o. female with past medical history significant for COPD, CAD, osteoporosis who presents today for medication follow up.  Brought home BP cuff to clinic to compare Home readings 20 points lower than clinic  Home readings 120-116 No issues with medications She feels clinic numbers are high due to her anxiety with coming to the clinic  Signed up for silver sneakers  Working on diet and exercise  HTN HCTZ 25mg  (started last OV)  BP Readings from Last 3 Encounters:  04/04/20 (!) 152/78  03/20/20 (!) 201/103  07/17/19 (!) 192/100   HLD Crestor 10mg  (started last OV) Lab Results  Component Value Date   CHOL 190 03/20/2020   HDL 57 03/20/2020   LDLCALC 112 (H) 03/20/2020   TRIG 119 03/20/2020   CHOLHDL 3.3 03/20/2020   The 10-year ASCVD risk score Mikey Bussing DC Jr., et al., 2013) is: 18.9%   Values used to calculate the score:     Age: 12 years     Sex: Female     Is Non-Hispanic African American: No     Diabetic: No     Tobacco smoker: No     Systolic Blood Pressure: 035 mmHg     Is BP treated: Yes     HDL Cholesterol: 57 mg/dL     Total Cholesterol: 190 mg/dL   Depression screen [facility] 2/9 04/04/2020 06/30/2019 02/14/2019  Decreased Interest 0 0 0  Down, Depressed, Hopeless 0 0 0  PHQ - 2 Score 0 0 0    Fall Risk  04/04/2020 03/20/2020 06/30/2019 02/14/2019 11/30/2018  Falls in the past year? 0 0 0 0 0  Number falls in past yr: 0 0 0 0 0  Injury with Fall? 0 0 0 0 0  Follow up Falls evaluation completed Falls evaluation completed Falls evaluation completed Falls evaluation completed;Education provided Falls evaluation completed     Allergies  Allergen Reactions  . Cefzil [Cefprozil] Other (See Comments)    Burning sensation in lower legs.   [name] [Tizanidine Hcl] Nausea  And Vomiting    Prior to Admission medications   Medication Sig Start Date End Date Taking? Authorizing Provider  acetaminophen (TYLENOL) 500 MG tablet Take 500 mg every 6 (six) hours as needed by mouth.    [provider]  albuterol (VENTOLIN HFA) 108 (90 Base) MCG/ACT inhaler Inhale 2 puffs into the lungs every 4 (four) hours as needed for wheezing or shortness of breath (cough, shortness of breath or wheezing.). 06/30/19   Forrest Moron, MD  alendronate (FOSAMAX) 70 MG tablet Take 1 tablet (70 mg total) by mouth once a week. 06/30/19   Forrest Moron, MD  aspirin EC 81 MG tablet Take 1 tablet (81 mg total) by mouth daily. 11/30/18   Forrest Moron, MD  Fluticasone-Umeclidin-Vilant (TRELEGY ELLIPTA) 100-62.5-25 MCG/INH AEPB Inhale 1 puff into the lungs daily. 02/06/19   Chesley Mires, MD  hydrochlorothiazide (HYDRODIURIL) 25 MG tablet Take 1 tablet (25 mg total) by mouth daily. 03/20/20   Bridey Brookover, Laurita Quint, FNP  rosuvastatin (CRESTOR) 10 MG tablet Take 1 tablet (10 mg total) by mouth daily. 03/20/20   Gedeon Brandow, Laurita Quint, FNP    Past Medical History:  Diagnosis Date  . Age-related osteoporosis with current pathological fracture 03/11/2017  [facility] at [facility] 03/29/2017 T score -3.1 at Rt total femur  . COPD (chronic obstructive pulmonary disease) ([place])   . Coronary atherosclerosis of native coronary artery 07/22/2017   Seen on chest Ct 07/21/17. Aortic and branch vessel atherosclerosis. Normal heart size, without pericardial effusion. Multivessel coronary artery atherosclerosis.  . Hypertension   . Non-traumatic compression fracture of vertebral column (HCC) 02/18/2017   Chest CT 07/21/17: Moderate T5 and moderate to severe T6 compression deformities w/o significant canal encroachment. Moderate T8 compression deformity is also w/o canal encroachment  . 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 Benjamine Mola 04/2017    Past Surgical History:  Procedure  Laterality Date  . EYE SURGERY    . MASS EXCISION Right 04/12/2017   Procedure: EXCISION OF RIGHT EAR  MASS;  Surgeon: Leta Baptist, MD;  Location: [place];  Service: ENT;  Laterality: Right; squamous cell cancer with clear margins  . SKIN FULL THICKNESS GRAFT Left 04/12/2017   Procedure: SKIN GRAFT FULL THICKNESS FROM ABDOMEN TO RIGHT EAR;  Surgeon: Leta Baptist, MD;  Location: [place];  Service: ENT;  Laterality: Right  . TONSILLECTOMY      Social History   Tobacco Use  . Smoking status: Former Smoker    Types: Cigarettes    Quit date: 02/03/2017    Years since quitting: 3.1  . Smokeless tobacco: Never Used  Substance Use Topics  . Alcohol use: No    Family History  Problem Relation Age of Onset  . Heart attack Mother   . Stroke Mother   . Lung cancer Father   . Arthritis Sister     Review of Systems  Eyes: Negative for blurred vision and double vision.  Respiratory: Negative for cough, sputum production and shortness of breath.   Cardiovascular: Negative for chest pain, palpitations and leg swelling.  Gastrointestinal: Negative for heartburn and nausea.  Neurological: Negative for dizziness and headaches.     OBJECTIVE:  Today's Vitals   04/04/20 0908 04/04/20 0939  BP: (!) 170/87 (!) 152/78  Pulse: 99   Temp: (!) 97.3 F (36.3 C)   SpO2: 92%   Weight: 145 lb (65.8 kg)   Height: 5\' 2"  (1.575 m)    Body mass index is 26.52 kg/m.   Physical Exam Constitutional:      General: She is not in acute distress.    Appearance: Normal appearance. She is not ill-appearing.  HENT:     Head: Normocephalic.  Cardiovascular:     Rate and Rhythm: Normal rate and regular rhythm.     Pulses: Normal pulses.     Heart sounds: Normal heart sounds. No murmur heard. No friction rub. No gallop.   Pulmonary:     Effort: Pulmonary effort is normal. No respiratory distress.     Breath sounds: Normal breath sounds. No stridor. No wheezing, rhonchi or  rales.  Abdominal:     General: Bowel sounds are normal.     Palpations: Abdomen is soft.     Tenderness: There is no abdominal tenderness.  Musculoskeletal:     Right lower leg: No edema.     Left lower leg: No edema.  Skin:    General: Skin is warm and dry.  Neurological:     Mental Status: She is alert and oriented to person, place, and time.  Psychiatric:        Mood and Affect: Mood normal.  Behavior: Behavior normal.     No results found for this or any previous visit (from the past 24 hour(s)).  No results found.   ASSESSMENT and PLAN  Problem List Items Addressed This Visit      Cardiovascular and Mediastinum   Coronary atherosclerosis of native coronary artery   Essential hypertension - Primary       Plan . Continue HCTZ . Encouraged to follow up if home SBP greater than 120 (given 20 point differential on home to clinic cuff) . Continue working on [employer]   Return in about 3 months (around 07/03/2020).    Huston Foley Donatello Kleve, FNP-BC Primary Care at [place], [place] [address] Ph.  [phone] Fax [phone]

## 2020-04-04 NOTE — Patient Instructions (Addendum)
  Hypertension, Adult Hypertension is another name for high blood pressure. High blood pressure forces your heart to work harder to pump blood. This can cause problems over time. There are two numbers in a blood pressure reading. There is a top number (systolic) over a bottom number (diastolic). It is best to have a blood pressure that is below 120/80. Healthy choices can help lower your blood pressure, or you may need medicine to help lower it. What are the causes? The cause of this condition is not known. Some conditions may be related to high blood pressure. What increases the risk?  Smoking.  Having type 2 diabetes mellitus, high cholesterol, or both.  Not getting enough exercise or physical activity.  Being overweight.  Having too much fat, sugar, calories, or salt (sodium) in your diet.  Drinking too much alcohol.  Having long-term (chronic) kidney disease.  Having a family history of high blood pressure.  Age. Risk increases with age.  Race. You may be at higher risk if you are African American.  Gender. Men are at higher risk than women before age 45. After age 65, women are at higher risk than men.  Having obstructive sleep apnea.  Stress. What are the signs or symptoms?  High blood pressure may not cause symptoms. Very high blood pressure (hypertensive crisis) may cause: ? Headache. ? Feelings of worry or nervousness (anxiety). ? Shortness of breath. ? Nosebleed. ? A feeling of being sick to your stomach (nausea). ? Throwing up (vomiting). ? Changes in how you see. ? Very bad chest pain. ? Seizures. How is this treated?  This condition is treated by making healthy lifestyle changes, such as: ? Eating healthy foods. ? Exercising more. ? Drinking less alcohol.  Your health care provider may prescribe medicine if lifestyle changes are not enough to get your blood pressure under control, and if: ? Your top number is above 130. ? Your bottom number is  above 80.  Your personal target blood pressure may vary. Follow these instructions at home: Eating and drinking  If told, follow the DASH eating plan. To follow this plan: ? Fill one half of your plate at each meal with fruits and vegetables. ? Fill one fourth of your plate at each meal with whole grains. Whole grains include whole-wheat pasta, brown rice, and whole-grain bread. ? Eat or drink low-fat dairy products, such as skim milk or low-fat yogurt. ? Fill one fourth of your plate at each meal with low-fat (lean) proteins. Low-fat proteins include fish, chicken without skin, eggs, beans, and tofu. ? Avoid fatty meat, cured and processed meat, or chicken with skin. ? Avoid pre-made or processed food.  Eat less than 1,500 mg of salt each day.  Do not drink alcohol if: ? Your doctor tells you not to drink. ? You are pregnant, may be pregnant, or are planning to become pregnant.  If you drink alcohol: ? Limit how much you use to:  0-1 drink a day for women.  0-2 drinks a day for men. ? Be aware of how much alcohol is in your drink. In the [place] one drink equals one 12 oz bottle of beer (355 mL), one 5 oz glass of wine (148 mL), or one 1 oz glass of hard liquor (44 mL).   Lifestyle  Work with your doctor to stay at a healthy weight or to lose weight. Ask your doctor what the best weight is for you.  Get at least 30 minutes of   exercise most days of the week. This may include walking, swimming, or biking.  Get at least 30 minutes of exercise that strengthens your muscles (resistance exercise) at least 3 days a week. This may include lifting weights or doing Pilates.  Do not use any products that contain nicotine or tobacco, such as cigarettes, e-cigarettes, and chewing tobacco. If you need help quitting, ask your doctor.  Check your blood pressure at home as told by your doctor.  Keep all follow-up visits as told by your doctor. This is important.   Medicines  Take  over-the-counter and prescription medicines only as told by your doctor. Follow directions carefully.  Do not skip doses of blood pressure medicine. The medicine does not work as well if you skip doses. Skipping doses also puts you at risk for problems.  Ask your doctor about side effects or reactions to medicines that you should watch for. Contact a doctor if you:  Think you are having a reaction to the medicine you are taking.  Have headaches that keep coming back (recurring).  Feel dizzy.  Have swelling in your ankles.  Have trouble with your vision. Get help right away if you:  Get a very bad headache.  Start to feel mixed up (confused).  Feel weak or numb.  Feel faint.  Have very bad pain in your: ? Chest. ? Belly (abdomen).  Throw up more than once.  Have trouble breathing. Summary  Hypertension is another name for high blood pressure.  High blood pressure forces your heart to work harder to pump blood.  For most people, a normal blood pressure is less than 120/80.  Making healthy choices can help lower blood pressure. If your blood pressure does not get lower with healthy choices, you may need to take medicine. This information is not intended to replace advice given to you by your health care provider. Make sure you discuss any questions you have with your health care provider. Document Revised: 11/03/2017 Document Reviewed: 11/03/2017 Elsevier Patient Education  2021 Elsevier Inc.   If you have lab work done today you will be contacted with your lab results within the next 2 weeks.  If you have not heard from us then please contact us. The fastest way to get your results is to register for My Chart.   IF you received an x-ray today, you will receive an invoice from [facility]. Please contact [facility] at [phone] with questions or concerns regarding your invoice.   IF you received labwork today, you will receive an invoice from  [facility]. Please contact [facility] at [phone] with questions or concerns regarding your invoice.   Our billing staff will not be able to assist you with questions regarding bills from these companies.  You will be contacted with the lab results as soon as they are available. The fastest way to get your results is to activate your My Chart account. Instructions are located on the last page of this paperwork. If you have not heard from us regarding the results in 2 weeks, please contact this office.      

## 2020-04-06 DIAGNOSIS — J432 Centrilobular emphysema: Secondary | ICD-10-CM | POA: Diagnosis not present

## 2020-04-06 DIAGNOSIS — J9611 Chronic respiratory failure with hypoxia: Secondary | ICD-10-CM | POA: Diagnosis not present

## 2020-04-06 DIAGNOSIS — J449 Chronic obstructive pulmonary disease, unspecified: Secondary | ICD-10-CM | POA: Diagnosis not present

## 2020-04-17 ENCOUNTER — Ambulatory Visit
Admission: RE | Admit: 2020-04-17 | Discharge: 2020-04-17 | Disposition: A | Discharge status: Home / Self Care | Payer: Medicare HMO | Source: Ambulatory Visit | Attending: Family Medicine | Admitting: Family Medicine

## 2020-04-17 ENCOUNTER — Other Ambulatory Visit: Payer: Self-pay

## 2020-04-17 DIAGNOSIS — Z1231 Encounter for screening mammogram for malignant neoplasm of breast: Secondary | ICD-10-CM

## 2020-04-26 ENCOUNTER — Other Ambulatory Visit: Payer: Self-pay | Admitting: Pulmonary Disease

## 2020-04-26 DIAGNOSIS — J9611 Chronic respiratory failure with hypoxia: Secondary | ICD-10-CM

## 2020-04-26 DIAGNOSIS — J449 Chronic obstructive pulmonary disease, unspecified: Secondary | ICD-10-CM

## 2020-04-26 DIAGNOSIS — J432 Centrilobular emphysema: Secondary | ICD-10-CM

## 2020-05-06 DIAGNOSIS — J449 Chronic obstructive pulmonary disease, unspecified: Secondary | ICD-10-CM | POA: Diagnosis not present

## 2020-05-06 DIAGNOSIS — J432 Centrilobular emphysema: Secondary | ICD-10-CM | POA: Diagnosis not present

## 2020-05-06 DIAGNOSIS — J9611 Chronic respiratory failure with hypoxia: Secondary | ICD-10-CM | POA: Diagnosis not present

## 2020-05-27 ENCOUNTER — Other Ambulatory Visit: Payer: Self-pay | Admitting: Pulmonary Disease

## 2020-05-27 DIAGNOSIS — J9611 Chronic respiratory failure with hypoxia: Secondary | ICD-10-CM

## 2020-05-27 DIAGNOSIS — J432 Centrilobular emphysema: Secondary | ICD-10-CM

## 2020-05-27 DIAGNOSIS — J449 Chronic obstructive pulmonary disease, unspecified: Secondary | ICD-10-CM

## 2020-05-28 ENCOUNTER — Other Ambulatory Visit: Payer: Self-pay | Admitting: Family Medicine

## 2020-05-28 DIAGNOSIS — M810 Age-related osteoporosis without current pathological fracture: Secondary | ICD-10-CM

## 2020-05-28 MED ORDER — ALENDRONATE SODIUM 70 MG PO TABS: 70.0000 mg | ORAL_TABLET | ORAL | 3 refills | Status: DC

## 2020-05-28 NOTE — Telephone Encounter (Signed)
Requested Prescriptions  Pending Prescriptions Disp Refills  . alendronate (FOSAMAX) 70 MG tablet 12 tablet 3    Sig: Take 1 tablet (70 mg total) by mouth once a week.     Endocrinology:  Bisphosphonates Passed - 05/28/2020  1:26 PM      Passed - Ca in normal range and within 360 days    Calcium  Date Value Ref Range Status  03/20/2020 9.7 8.7 - 10.3 mg/dL Final         Passed - Vitamin D in normal range and within 360 days    Vit D, 25-Hydroxy  Date Value Ref Range Status  03/20/2020 58.7 30.0 - 100.0 ng/mL Final    Comment:    Vitamin D deficiency has been defined by the [place] practice guideline as a level of serum 25-OH vitamin D less than 20 ng/mL (1,2). The Endocrine Society went on to further define vitamin D insufficiency as a level between 21 and 29 ng/mL (2). 1. IOM (Institute of Medicine). 2010. Dietary reference    intakes for calcium and D. [place]: The    [employer]. 2. Holick MF, Binkley [place], Bischoff-Ferrari HA, et al.    Evaluation, treatment, and prevention of vitamin D    deficiency: an Endocrine Society clinical practice    guideline. JCEM. 2011 Jul; 96(7):1911-30.          Passed - Valid encounter within last 12 months    Recent Outpatient Visits          1 month ago Essential hypertension   [facility], Laurita Quint, FNP   2 months ago Age-related osteoporosis with current pathological fracture with routine healing, subsequent encounter   Primary [facility] at [facility], Laurita Quint, FNP   9 months ago Age-related osteoporosis with current pathological fracture with routine healing, subsequent encounter   [facility] at [facility], Lilia Argue, MD   11 months ago Need for prophylactic vaccination with combined diphtheria-tetanus-pertussis (DTP) vaccine   [facility] at [facility], Arlie Solomons, MD   1 year ago Medicare annual wellness visit, subsequent   Primary Care at [facility], Lilia Argue, MD      Future Appointments            In 1 month Just, Laurita Quint, FNP Primary [facility] at [place], [facility]

## 2020-05-28 NOTE — Telephone Encounter (Signed)
Medication Refill - Medication:    alendronate (FOSAMAX) 70 MG tablet    Has the patient contacted their pharmacy? Yes.  contact office.  Needs a refill while she finds a new doctor due to [facility] closing.   Preferred Pharmacy (with phone number or street name):   [place], [place] - [id number]  [address] [place], [place] [address]  Phone:  [phone] Fax:  [phone]   Agent: Please be advised that RX refills may take up to 3 business days. We ask that you follow-up with your pharmacy.

## 2020-06-04 DIAGNOSIS — J449 Chronic obstructive pulmonary disease, unspecified: Secondary | ICD-10-CM | POA: Diagnosis not present

## 2020-06-04 DIAGNOSIS — J9611 Chronic respiratory failure with hypoxia: Secondary | ICD-10-CM | POA: Diagnosis not present

## 2020-06-04 DIAGNOSIS — J432 Centrilobular emphysema: Secondary | ICD-10-CM | POA: Diagnosis not present

## 2020-07-03 ENCOUNTER — Ambulatory Visit: Payer: Medicare HMO | Admitting: Family Medicine

## 2020-07-05 DIAGNOSIS — J432 Centrilobular emphysema: Secondary | ICD-10-CM | POA: Diagnosis not present

## 2020-07-05 DIAGNOSIS — J449 Chronic obstructive pulmonary disease, unspecified: Secondary | ICD-10-CM | POA: Diagnosis not present

## 2020-07-05 DIAGNOSIS — J9611 Chronic respiratory failure with hypoxia: Secondary | ICD-10-CM | POA: Diagnosis not present

## 2020-07-05 DIAGNOSIS — H5200 Hypermetropia, unspecified eye: Secondary | ICD-10-CM | POA: Diagnosis not present

## 2020-07-05 DIAGNOSIS — Z0100 Encounter for examination of eyes and vision without abnormal findings: Secondary | ICD-10-CM | POA: Diagnosis not present

## 2020-08-04 DIAGNOSIS — J432 Centrilobular emphysema: Secondary | ICD-10-CM | POA: Diagnosis not present

## 2020-08-04 DIAGNOSIS — J9611 Chronic respiratory failure with hypoxia: Secondary | ICD-10-CM | POA: Diagnosis not present

## 2020-08-04 DIAGNOSIS — J449 Chronic obstructive pulmonary disease, unspecified: Secondary | ICD-10-CM | POA: Diagnosis not present

## 2020-08-15 ENCOUNTER — Ambulatory Visit: Payer: Medicare HMO | Admitting: Pulmonary Disease

## 2020-08-15 ENCOUNTER — Encounter: Payer: Self-pay | Admitting: Pulmonary Disease

## 2020-08-15 ENCOUNTER — Other Ambulatory Visit: Payer: Self-pay

## 2020-08-15 DIAGNOSIS — J432 Centrilobular emphysema: Secondary | ICD-10-CM

## 2020-08-15 DIAGNOSIS — J449 Chronic obstructive pulmonary disease, unspecified: Secondary | ICD-10-CM

## 2020-08-15 DIAGNOSIS — J9611 Chronic respiratory failure with hypoxia: Secondary | ICD-10-CM

## 2020-08-15 MED ORDER — TRELEGY ELLIPTA 100-62.5-25 MCG/INH IN AEPB: 1.0000 | INHALATION_SPRAY | Freq: Every day | RESPIRATORY_TRACT | 11 refills | Status: DC

## 2020-08-15 MED ORDER — ALBUTEROL SULFATE HFA 108 (90 BASE) MCG/ACT IN AERS: 2.0000 | INHALATION_SPRAY | RESPIRATORY_TRACT | 1 refills | Status: DC | PRN

## 2020-08-15 NOTE — Progress Notes (Signed)
Beth Beth [name], Critical Care, and Sleep Medicine  Chief Complaint  Patient presents with   Follow-up    No respiratory complaints    Constitutional:  BP (!) 148/96 (BP Location: Left Arm, Cuff Size: Normal)   Pulse 90   Temp (!) 97.5 F (36.4 C) (Temporal)   Ht 5\' 1"  (1.549 m)   Wt 143 lb 3.2 oz (65 kg)   SpO2 92% Comment: Room air  BMI 27.06 kg/m   Past Medical History:  HTN, CAD, Osteoporosis  Past Surgical History:  She  has a past surgical history that includes Tonsillectomy; Eye surgery; Mass excision (Right, 04/12/2017); and Full thickness skin graft (Left, 04/12/2017).  Brief Summary:  Beth Beth [name] is a 70 y.o. female former smoker with emphysema and right middle lung scarring.      Subjective:   She had LDCT in December.  Stable findings.  Not having cough, wheeze, sputum, or chest congestion.  Keeps up with routine activities.  Uses oxygen during the day with activities.  Hasn't been using oxygen at night.  Her cannula gets tangled and then wakes her up.  Physical Exam:   Appearance - well kempt   ENMT - no sinus tenderness, no oral exudate, no LAN, Mallampati 2 airway, no stridor  Respiratory - equal breath sounds bilaterally, no wheezing or rales  CV - s1s2 regular rate and rhythm, no murmurs  Ext - no clubbing, no edema  Skin - no rashes  Psych - normal mood and affect   Beth [name] testing:  PFT 11/09/17 >> FEV1 0.71 (35%), FEV1% 41, TLC 5.52 (119%), DLCO 48%, no BD A1AT 10/04/17 >> 108, MS PFT 11/09/17 >> FEV1 0.71 (35%), FEV1% 41, TLC 5.52 (119%), RV 3.71 (182%), DLCO 48, no BD Ambulatory oximetry RA 12/05/18 >> SpO2 88%  Chest Imaging:  CT chest 07/21/17 >> atherosclerosis, bullous emphysema, RML partial collapse LDCT chest 02/28/19 >> mild/mod emphysema, 2.8 RUL nodule new, fatty liver, atherosclerosis LDCT chest 02/29/20 >> Lung RADS 2  Sleep Tests:  ONO with RA 02/06/19 >> test time 4 hrs 23 min.  Baseline SpO2 86%, low SpO2 74%.   Spent 3 hrs 30 min with SpO2 < 88%.  Cardiac Tests:    Social History:  She  reports that she quit smoking about 3 years ago. Her smoking use included cigarettes. She has never used smokeless tobacco. She reports that she does not drink alcohol and does not use drugs.  Family History:  Her family history includes Arthritis in her sister; Heart attack in her mother; Lung cancer in her father; Stroke in her mother.     Assessment/Plan:   COPD with emphysema. - continue trelegy with prn albuterol - she will check with her pharmacy and insurance about whether 3 months refills would be less expensive - she will check with her PCP about getting pneumovax booster   Chronic respiratory failure with hypoxia. -  advised her to use 2 liters oxygen at night on regular basis - 2 liters oxygen with exertion also - explained goal SpO2 > 90%   History of tobacco abuse. - f/u low dose CT chest in December 2023 as part of lung cancer screening   Time Spent Involved in Patient Care on Day of Examination:  23 minutes  Follow up:   Patient Instructions  Check with your pharmacy and insurance plan if 3 months refills for inhalers with provide cost savings  Follow up in 1 year  Medication List:   Allergies as of 08/15/2020  Reactions   Cefzil [cefprozil] Other (See Comments)   Burning sensation in lower legs.    Zanaflex [tizanidine Hcl] Nausea And Vomiting        Medication List        Accurate as of August 15, 2020 10:56 AM. If you have any questions, ask your nurse or doctor.          acetaminophen 500 MG tablet Commonly known as: TYLENOL Take 500 mg every 6 (six) hours as needed by mouth.   albuterol 108 (90 Base) MCG/ACT inhaler Commonly known as: VENTOLIN HFA Inhale 2 puffs into the lungs every 4 (four) hours as needed for wheezing or shortness of breath (cough, shortness of breath or wheezing.).   alendronate 70 MG tablet Commonly known as: FOSAMAX Take 1 tablet  (70 mg total) by mouth once a week.   aspirin EC 81 MG tablet Take 1 tablet (81 mg total) by mouth daily.   hydrochlorothiazide 25 MG tablet Commonly known as: HYDRODIURIL Take 1 tablet (25 mg total) by mouth daily.   rosuvastatin 10 MG tablet Commonly known as: Crestor Take 1 tablet (10 mg total) by mouth daily.   Trelegy Ellipta 100-62.5-25 MCG/INH Aepb Generic drug: Fluticasone-Umeclidin-Vilant Inhale 1 puff into the lungs daily. What changed: See the new instructions. Changed by: Chesley Mires, MD        Signature:  Chesley Mires, MD [place] Pager - [phone] 08/15/2020, 10:56 AM

## 2020-08-15 NOTE — Patient Instructions (Signed)
Check with your pharmacy and insurance plan if 3 months refills for inhalers with provide cost savings  Follow up in 1 year

## 2020-08-21 ENCOUNTER — Other Ambulatory Visit: Payer: Self-pay

## 2020-08-22 ENCOUNTER — Encounter: Payer: Self-pay | Admitting: Internal Medicine

## 2020-08-22 ENCOUNTER — Ambulatory Visit (INDEPENDENT_AMBULATORY_CARE_PROVIDER_SITE_OTHER): Payer: Medicare HMO | Admitting: Internal Medicine

## 2020-08-22 VITALS — BP 140/80 | HR 111 | Temp 98.4°F | Wt 143.7 lb

## 2020-08-22 DIAGNOSIS — I10 Essential (primary) hypertension: Secondary | ICD-10-CM | POA: Diagnosis not present

## 2020-08-22 DIAGNOSIS — M8000XD Age-related osteoporosis with current pathological fracture, unspecified site, subsequent encounter for fracture with routine healing: Secondary | ICD-10-CM

## 2020-08-22 DIAGNOSIS — R030 Elevated blood-pressure reading, without diagnosis of hypertension: Secondary | ICD-10-CM

## 2020-08-22 DIAGNOSIS — J432 Centrilobular emphysema: Secondary | ICD-10-CM | POA: Diagnosis not present

## 2020-08-22 DIAGNOSIS — J9611 Chronic respiratory failure with hypoxia: Secondary | ICD-10-CM | POA: Diagnosis not present

## 2020-08-22 NOTE — Patient Instructions (Signed)
-  Nice seeing you today!!  -Schedule follow up in 3 months for your physical. Please come in fasting that day. 

## 2020-08-22 NOTE — Progress Notes (Signed)
New Patient Office Visit     This visit occurred during the SARS-CoV-2 public health emergency.  Safety protocols were in place, including screening questions prior to the visit, additional usage of staff PPE, and extensive cleaning of exam room while observing appropriate contact time as indicated for disinfecting solutions.    CC/Reason for Visit: Establish care, discuss chronic medical conditions Previous PCP: [facility] Last Visit: 2021  HPI: Beth [name] is a 70 y.o. female who is coming in today for the above mentioned reasons. Past Medical History is significant for: COPD with chronic hypoxemic respiratory failure, hypertension with an element of whitecoat syndrome, hyperlipidemia, osteoporosis on alendronate.  She does annual low-dose CT scans in December for lung cancer screening.  She is retired, she lives with her husband, daughter and grandson.  She declines COVID vaccination despite counseling, she is due for shingles vaccine but other immunizations are up-to-date.  She has been feeling well and has no acute concerns or complaints.   Past Medical/Surgical History: Past Medical History:  Diagnosis Date   Age-related osteoporosis with current pathological fracture 03/11/2017   [facility]A bone [facility] at [facility] 03/29/2017 T score -3.1 at Rt total femur   COPD (chronic obstructive pulmonary disease) (HCC)    Coronary atherosclerosis of native coronary artery 07/22/2017   Seen on chest Ct 07/21/17. Aortic and branch vessel atherosclerosis. Normal heart size, without pericardial effusion. Multivessel coronary artery atherosclerosis.   Hypertension    Non-traumatic compression fracture of vertebral column ([place]) 02/18/2017   Chest CT 07/21/17: Moderate T5 and moderate to severe T6 compression deformities w/o significant canal encroachment. Moderate T8 compression deformity is also w/o canal encroachment   Squamous cell cancer of external ear, right 03/2017   right, excison by  Dr. Benjamine Mola 04/2017    Past Surgical History:  Procedure Laterality Date   EYE SURGERY     MASS EXCISION Right 04/12/2017   Procedure: EXCISION OF RIGHT EAR  MASS;  Surgeon: Leta Baptist, MD;  Location: [facility];  Service: ENT;  Laterality: Right; squamous cell cancer with clear margins   SKIN FULL THICKNESS GRAFT Left 04/12/2017   Procedure: SKIN GRAFT FULL THICKNESS FROM ABDOMEN TO RIGHT EAR;  Surgeon: Leta Baptist, MD;  Location: [facility];  Service: ENT;  Laterality: Right   TONSILLECTOMY      Social History:  reports that she quit smoking about 3 years ago. Her smoking use included cigarettes. She has never used smokeless tobacco. She reports that she does not drink alcohol and does not use drugs.  Allergies: Allergies  Allergen Reactions   Cefzil [Cefprozil] Other (See Comments)    Burning sensation in lower legs.    Zanaflex [Tizanidine Hcl] Nausea And Vomiting    Family History:  Family History  Problem Relation Age of Onset   Heart attack Mother    Stroke Mother    Lung cancer Father    Arthritis Sister      Current Outpatient Medications:    acetaminophen (TYLENOL) 500 MG tablet, Take 500 mg every 6 (six) hours as needed by mouth., Disp: , Rfl:    albuterol (VENTOLIN HFA) 108 (90 Base) MCG/ACT inhaler, Inhale 2 puffs into the lungs every 4 (four) hours as needed for wheezing or shortness of breath (cough, shortness of breath or wheezing.)., Disp: 18 g, Rfl: 1   alendronate (FOSAMAX) 70 MG tablet, Take 1 tablet (70 mg total) by mouth once a week., Disp: 12 tablet, Rfl: 3  aspirin EC 81 MG tablet, Take 1 tablet (81 mg total) by mouth daily., Disp: 30 tablet, Rfl: 11   Fluticasone-Umeclidin-Vilant (TRELEGY ELLIPTA) 100-62.5-25 MCG/INH AEPB, Inhale 1 puff into the lungs daily., Disp: 60 each, Rfl: 11   hydrochlorothiazide (HYDRODIURIL) 25 MG tablet, Take 1 tablet (25 mg total) by mouth daily., Disp: 90 tablet, Rfl: 3   rosuvastatin (CRESTOR) 10 MG  tablet, Take 1 tablet (10 mg total) by mouth daily., Disp: 90 tablet, Rfl: 3  Review of Systems:  Constitutional: Denies fever, chills, diaphoresis, appetite change and fatigue.  HEENT: Denies photophobia, eye pain, redness, hearing loss, ear pain, congestion, sore throat, rhinorrhea, sneezing, mouth sores, trouble swallowing, neck pain, neck stiffness and tinnitus.   Respiratory: Denies SOB, DOE, cough, chest tightness,  and wheezing.   Cardiovascular: Denies chest pain, palpitations and leg swelling.  Gastrointestinal: Denies nausea, vomiting, abdominal pain, diarrhea, constipation, blood in stool and abdominal distention.  Genitourinary: Denies dysuria, urgency, frequency, hematuria, flank pain and difficulty urinating.  Endocrine: Denies: hot or cold intolerance, sweats, changes in hair or nails, polyuria, polydipsia. Musculoskeletal: Denies myalgias, back pain, joint swelling, arthralgias and gait problem.  Skin: Denies pallor, rash and wound.  Neurological: Denies dizziness, seizures, syncope, weakness, light-headedness, numbness and headaches.  Hematological: Denies adenopathy. Easy bruising, personal or family bleeding history  Psychiatric/Behavioral: Denies suicidal ideation, mood changes, confusion, nervousness, sleep disturbance and agitation    Physical Exam: Vitals:   08/22/20 1059  BP: 140/80  Pulse: (!) 111  Temp: 98.4 F (36.9 C)  TempSrc: Oral  SpO2: 90%  Weight: 143 lb 11.2 oz (65.2 kg)   Body mass index is 27.15 kg/m.  Constitutional: NAD, calm, comfortable Eyes: PERRL, lids and conjunctivae normal, wears corrective lenses ENMT: Mucous membranes are moist.  Respiratory: clear to auscultation bilaterally, no wheezing, no crackles. Normal respiratory effort. No accessory muscle use.  Cardiovascular: Regular rate and rhythm, no murmurs / rubs / gallops. No extremity edema.  Neurologic: Grossly intact and nonfocal Psychiatric: Normal judgment and insight. Alert  and oriented x 3. Normal mood.    Impression and Plan:  Age-related osteoporosis with current pathological fracture with routine healing, subsequent encounter -On Fosamax.  Centrilobular emphysema (HCC) Chronic respiratory failure with hypoxia (HCC) -Followed by pulmonary, on Trelegy and as needed albuterol.  Essential hypertension White coat syndrome with high blood pressure but without hypertension -Review of chart notes a well-documented history of whitecoat syndrome which is corroborated in today's office visit.  Blood pressure today is 140/80 in office, home measurements with systolics in the 916O to 060O. -At next visit she will come in with blood pressure cuff and home measurements to confirm.  Time spent: 45 minutes reviewing chart, interviewing and examining patient, performing medication reconciliation and formulating plan of care.    Patient Instructions  -Nice seeing you today!!  -Schedule follow up in 3 months for your physical. Please come in fasting that day.    Lelon Frohlich, MD [facility] at [facility]

## 2020-09-04 DIAGNOSIS — J9611 Chronic respiratory failure with hypoxia: Secondary | ICD-10-CM | POA: Diagnosis not present

## 2020-09-04 DIAGNOSIS — J449 Chronic obstructive pulmonary disease, unspecified: Secondary | ICD-10-CM | POA: Diagnosis not present

## 2020-09-04 DIAGNOSIS — J432 Centrilobular emphysema: Secondary | ICD-10-CM | POA: Diagnosis not present

## 2020-09-06 ENCOUNTER — Ambulatory Visit: Payer: Self-pay | Admitting: Pulmonary Disease

## 2020-10-04 DIAGNOSIS — J9611 Chronic respiratory failure with hypoxia: Secondary | ICD-10-CM | POA: Diagnosis not present

## 2020-10-04 DIAGNOSIS — J432 Centrilobular emphysema: Secondary | ICD-10-CM | POA: Diagnosis not present

## 2020-10-04 DIAGNOSIS — J449 Chronic obstructive pulmonary disease, unspecified: Secondary | ICD-10-CM | POA: Diagnosis not present

## 2020-10-16 DIAGNOSIS — D485 Neoplasm of uncertain behavior of skin: Secondary | ICD-10-CM | POA: Diagnosis not present

## 2020-10-16 DIAGNOSIS — C44311 Basal cell carcinoma of skin of nose: Secondary | ICD-10-CM | POA: Diagnosis not present

## 2020-11-04 DIAGNOSIS — J9611 Chronic respiratory failure with hypoxia: Secondary | ICD-10-CM | POA: Diagnosis not present

## 2020-11-04 DIAGNOSIS — J449 Chronic obstructive pulmonary disease, unspecified: Secondary | ICD-10-CM | POA: Diagnosis not present

## 2020-11-04 DIAGNOSIS — J432 Centrilobular emphysema: Secondary | ICD-10-CM | POA: Diagnosis not present

## 2020-11-17 ENCOUNTER — Other Ambulatory Visit: Payer: Self-pay

## 2020-11-17 ENCOUNTER — Encounter (HOSPITAL_COMMUNITY): Payer: Self-pay

## 2020-11-17 ENCOUNTER — Inpatient Hospital Stay (HOSPITAL_COMMUNITY)
Admission: EM | Admit: 2020-11-17 | Discharge: 2020-11-20 | DRG: 177 | Disposition: A | Discharge status: Home / Self Care | Payer: Medicare HMO | Attending: Family Medicine | Admitting: Family Medicine

## 2020-11-17 ENCOUNTER — Emergency Department (HOSPITAL_COMMUNITY): Payer: Medicare HMO

## 2020-11-17 DIAGNOSIS — Z28310 Unvaccinated for covid-19: Secondary | ICD-10-CM | POA: Diagnosis not present

## 2020-11-17 DIAGNOSIS — Z79899 Other long term (current) drug therapy: Secondary | ICD-10-CM

## 2020-11-17 DIAGNOSIS — J9621 Acute and chronic respiratory failure with hypoxia: Secondary | ICD-10-CM | POA: Diagnosis not present

## 2020-11-17 DIAGNOSIS — R509 Fever, unspecified: Secondary | ICD-10-CM | POA: Diagnosis not present

## 2020-11-17 DIAGNOSIS — R0689 Other abnormalities of breathing: Secondary | ICD-10-CM | POA: Diagnosis not present

## 2020-11-17 DIAGNOSIS — Z888 Allergy status to other drugs, medicaments and biological substances status: Secondary | ICD-10-CM | POA: Diagnosis not present

## 2020-11-17 DIAGNOSIS — E871 Hypo-osmolality and hyponatremia: Secondary | ICD-10-CM | POA: Diagnosis not present

## 2020-11-17 DIAGNOSIS — U071 COVID-19: Principal | ICD-10-CM

## 2020-11-17 DIAGNOSIS — Z7951 Long term (current) use of inhaled steroids: Secondary | ICD-10-CM

## 2020-11-17 DIAGNOSIS — I10 Essential (primary) hypertension: Secondary | ICD-10-CM | POA: Diagnosis not present

## 2020-11-17 DIAGNOSIS — J449 Chronic obstructive pulmonary disease, unspecified: Secondary | ICD-10-CM | POA: Diagnosis not present

## 2020-11-17 DIAGNOSIS — I2510 Atherosclerotic heart disease of native coronary artery without angina pectoris: Secondary | ICD-10-CM | POA: Diagnosis present

## 2020-11-17 DIAGNOSIS — Z85828 Personal history of other malignant neoplasm of skin: Secondary | ICD-10-CM | POA: Diagnosis not present

## 2020-11-17 DIAGNOSIS — Z87891 Personal history of nicotine dependence: Secondary | ICD-10-CM

## 2020-11-17 DIAGNOSIS — E785 Hyperlipidemia, unspecified: Secondary | ICD-10-CM | POA: Diagnosis present

## 2020-11-17 DIAGNOSIS — Z7982 Long term (current) use of aspirin: Secondary | ICD-10-CM

## 2020-11-17 DIAGNOSIS — Z7983 Long term (current) use of bisphosphonates: Secondary | ICD-10-CM

## 2020-11-17 DIAGNOSIS — Z8249 Family history of ischemic heart disease and other diseases of the circulatory system: Secondary | ICD-10-CM

## 2020-11-17 DIAGNOSIS — E861 Hypovolemia: Secondary | ICD-10-CM | POA: Diagnosis not present

## 2020-11-17 DIAGNOSIS — M810 Age-related osteoporosis without current pathological fracture: Secondary | ICD-10-CM | POA: Diagnosis present

## 2020-11-17 DIAGNOSIS — E876 Hypokalemia: Secondary | ICD-10-CM | POA: Diagnosis not present

## 2020-11-17 DIAGNOSIS — R110 Nausea: Secondary | ICD-10-CM | POA: Diagnosis not present

## 2020-11-17 DIAGNOSIS — R1111 Vomiting without nausea: Secondary | ICD-10-CM | POA: Diagnosis not present

## 2020-11-17 DIAGNOSIS — R531 Weakness: Secondary | ICD-10-CM | POA: Diagnosis not present

## 2020-11-17 DIAGNOSIS — I959 Hypotension, unspecified: Secondary | ICD-10-CM | POA: Diagnosis not present

## 2020-11-17 DIAGNOSIS — J439 Emphysema, unspecified: Secondary | ICD-10-CM | POA: Diagnosis not present

## 2020-11-17 DIAGNOSIS — R0902 Hypoxemia: Secondary | ICD-10-CM

## 2020-11-17 DIAGNOSIS — Z9981 Dependence on supplemental oxygen: Secondary | ICD-10-CM

## 2020-11-17 DIAGNOSIS — R55 Syncope and collapse: Secondary | ICD-10-CM | POA: Diagnosis not present

## 2020-11-17 LAB — BASIC METABOLIC PANEL
Anion gap: 10 (ref 5–15)
BUN: 12 mg/dL (ref 8–23)
CO2: 28 mmol/L (ref 22–32)
Calcium: 7.8 mg/dL — ABNORMAL LOW (ref 8.9–10.3)
Chloride: 88 mmol/L — ABNORMAL LOW (ref 98–111)
Creatinine, Ser: 0.64 mg/dL (ref 0.44–1.00)
GFR, Estimated: >60 mL/min (ref >60)
Glucose, Bld: 112 mg/dL — ABNORMAL HIGH (ref 70–99)
Potassium: 3.2 mmol/L — ABNORMAL LOW (ref 3.5–5.1)
Sodium: 126 mmol/L — ABNORMAL LOW (ref 135–145)

## 2020-11-17 LAB — CBC WITH DIFFERENTIAL/PLATELET
Abs Immature Granulocytes: 0.01 10*3/uL (ref 0.00–0.07)
Basophils Absolute: 0 10*3/uL (ref 0.0–0.1)
Basophils Relative: 0 %
Eosinophils Absolute: 0 10*3/uL (ref 0.0–0.5)
Eosinophils Relative: 0 %
HCT: 42.3 % (ref 36.0–46.0)
Hemoglobin: 14.2 g/dL (ref 12.0–15.0)
Immature Granulocytes: 0 %
Lymphocytes Relative: 18 %
Lymphs Abs: 0.7 10*3/uL (ref 0.7–4.0)
MCH: 30.8 pg (ref 26.0–34.0)
MCHC: 33.6 g/dL (ref 30.0–36.0)
MCV: 91.8 fL (ref 80.0–100.0)
Monocytes Absolute: 0.5 10*3/uL (ref 0.1–1.0)
Monocytes Relative: 12 %
Neutro Abs: 2.7 10*3/uL (ref 1.7–7.7)
Neutrophils Relative %: 70 %
Platelets: 194 10*3/uL (ref 150–400)
RBC: 4.61 MIL/uL (ref 3.87–5.11)
RDW: 13 % (ref 11.5–15.5)
WBC: 4 10*3/uL (ref 4.0–10.5)
nRBC: 0 % (ref 0.0–0.2)

## 2020-11-17 LAB — RESP PANEL BY RT-PCR (FLU A&B, COVID) ARPGX2
Influenza A by PCR: NEGATIVE
Influenza B by PCR: NEGATIVE
SARS Coronavirus 2 by RT PCR: POSITIVE — AB

## 2020-11-17 LAB — FIBRINOGEN: Fibrinogen: 516 mg/dL — ABNORMAL HIGH (ref 210–475)

## 2020-11-17 LAB — URINALYSIS, MICROSCOPIC (REFLEX)

## 2020-11-17 LAB — LACTATE DEHYDROGENASE: LDH: 189 U/L (ref 98–192)

## 2020-11-17 LAB — C-REACTIVE PROTEIN: CRP: 0.6 mg/dL (ref <1.0)

## 2020-11-17 LAB — URINALYSIS, ROUTINE W REFLEX MICROSCOPIC
Bilirubin Urine: NEGATIVE
Glucose, UA: NEGATIVE mg/dL
Hgb urine dipstick: NEGATIVE
Ketones, ur: 40 mg/dL — AB
Nitrite: NEGATIVE
Protein, ur: NEGATIVE mg/dL
Specific Gravity, Urine: <1.005 — ABNORMAL LOW (ref 1.005–1.030)
pH: 7 (ref 5.0–8.0)

## 2020-11-17 LAB — D-DIMER, QUANTITATIVE: D-Dimer, Quant: 0.65 ug/mL-FEU — ABNORMAL HIGH (ref 0.00–0.50)

## 2020-11-17 LAB — T4, FREE: Free T4: 0.93 ng/dL (ref 0.61–1.12)

## 2020-11-17 LAB — FERRITIN: Ferritin: 342 ng/mL — ABNORMAL HIGH (ref 11–307)

## 2020-11-17 LAB — CBG MONITORING, ED
Glucose-Capillary: 105 mg/dL — ABNORMAL HIGH (ref 70–99)
Glucose-Capillary: 94 mg/dL (ref 70–99)

## 2020-11-17 LAB — TSH: TSH: 0.791 u[IU]/mL (ref 0.350–4.500)

## 2020-11-17 LAB — TRIGLYCERIDES: Triglycerides: 72 mg/dL (ref <150)

## 2020-11-17 LAB — PROCALCITONIN: Procalcitonin: <0.1 ng/mL

## 2020-11-17 LAB — LACTIC ACID, PLASMA
Lactic Acid, Venous: 1 mmol/L (ref 0.5–1.9)
Lactic Acid, Venous: 0.9 mmol/L (ref 0.5–1.9)

## 2020-11-17 MED ORDER — SODIUM CHLORIDE 0.9 % IV SOLN
100.0000 mg | INTRAVENOUS | Status: AC
Start: 2020-11-17 — End: 2020-11-17
  Administered 2020-11-17 (×2): 100 mg via INTRAVENOUS
  Filled 2020-11-17 (×2): qty 20

## 2020-11-17 MED ORDER — SODIUM CHLORIDE 0.9 % IV BOLUS
1000.0000 mL | Freq: Once | INTRAVENOUS | Status: AC
  Administered 2020-11-17: 1000 mL via INTRAVENOUS

## 2020-11-17 MED ORDER — SODIUM CHLORIDE 0.9 % IV SOLN: 100.0000 mg | Freq: Every day | INTRAVENOUS | Status: DC

## 2020-11-17 MED ORDER — SODIUM CHLORIDE 0.9 % IV SOLN
100.0000 mg | Freq: Every day | INTRAVENOUS | Status: DC
  Administered 2020-11-18 – 2020-11-20 (×3): 100 mg via INTRAVENOUS
  Filled 2020-11-17 (×3): qty 20

## 2020-11-17 MED ORDER — SODIUM CHLORIDE 0.9 % IV SOLN: 200.0000 mg | Freq: Once | INTRAVENOUS | Status: DC

## 2020-11-17 MED ORDER — SODIUM CHLORIDE 0.9 % IV SOLN
INTRAVENOUS | Status: DC
Start: 2020-11-17 — End: 2020-11-18

## 2020-11-17 MED ORDER — POTASSIUM CHLORIDE CRYS ER 20 MEQ PO TBCR
40.0000 meq | EXTENDED_RELEASE_TABLET | Freq: Once | ORAL | Status: AC
  Administered 2020-11-17: 40 meq via ORAL
  Filled 2020-11-17: qty 2

## 2020-11-17 MED ORDER — DEXAMETHASONE SODIUM PHOSPHATE 10 MG/ML IJ SOLN
10.0000 mg | Freq: Once | INTRAMUSCULAR | Status: AC
  Administered 2020-11-17: 10 mg via INTRAVENOUS
  Filled 2020-11-17: qty 1

## 2020-11-17 NOTE — ED Triage Notes (Signed)
Pt BIB [facility] with syncopal episode x 2 today. EMS report pt has been sick since 9/9 and cough. On scene pt had SPO2 of 88% on R/A and they noted pt to be orthostatic + with a 20 point systolic drop in B/P upon standing. EMS admin 2L O2 via N/C. Pt A/Ox4 on arrival. Pt states she has had N/V, generalized weakness and malaise, poor appetite. Pt states the cough is normal per hx of emphysema.

## 2020-11-17 NOTE — Assessment & Plan Note (Addendum)
Due to hyponatremia, will hold HCTZ.

## 2020-11-17 NOTE — ED Notes (Signed)
Date and time results received: 11/17/20 1528  Test: COVID Critical Value: positive  Name of Provider Notified: Percell Miller, [place]  Orders Received? Or Actions Taken?: acknowledged

## 2020-11-17 NOTE — Assessment & Plan Note (Signed)
Prn duonebs.

## 2020-11-17 NOTE — Assessment & Plan Note (Signed)
Admit to med/surg bed. IV remdesivir and po decadron. Follow inflammatory markers. lovenox for DVT prophylaxis.

## 2020-11-17 NOTE — ED Provider Notes (Addendum)
Medical screening examination/treatment/procedure(s) were conducted as a shared visit with non-physician practitioner(s) and myself.  I personally evaluated the patient during the encounter.  Clinical Impression:   Final diagnoses:  COVID  Syncope and collapse  Hypokalemia  Hyponatremia  Hypoxia    This patient is a 70 year old female presenting after having a syncopal episode today, family members reported that she had passed out while she went to stand up, she does not recall that, she does not remember having any chest pain or palpitations, she does state that she has had nausea and vomiting for a couple of days, yesterday she was feeling poorly but improved but has had very little to eat or drink.  Denies any diarrhea denies rectal bleeding, denies any symptoms whatsoever at this time while she is lying down other than some generalized weakness.  EKG does not show any signs of acute ischemia or arrhythmia, I suspect this was related to some orthostasis, she will get IV fluids, she is afebrile and normotensive at this time.  There has however been some hypoxia that will require further evaluation,  On further evaluation the patient was found to have COVID-19, hypoxic respiratory failure, requiring admission to the hospital  Critical Care services provided   EKG Interpretation  Date/Time:  Sunday November 17 2020 15:32:57 EDT Ventricular Rate:  71 PR Interval:  134 QRS Duration: 103 QT Interval:  461 QTC Calculation: 501 R Axis:   65 Text Interpretation: Sinus rhythm Low voltage, precordial leads Prolonged QT interval similar earlier in the day Confirmed by Sherwood Gambler [id number]) on 11/17/2020 4:02:16 PM      .    Noemi Chapel, MD 11/17/20 [place], Hoyle Barkdull, MD 11/18/20 [phone]

## 2020-11-17 NOTE — Plan of Care (Signed)
  Problem: Education: Goal: Knowledge of disease or condition will improve Outcome: Progressing Goal: Knowledge of the prescribed therapeutic regimen will improve Outcome: Progressing Goal: Individualized Educational Video(s) Outcome: Progressing   Problem: Activity: Goal: Ability to tolerate increased activity will improve Outcome: Progressing Goal: Will verbalize the importance of balancing activity with adequate rest periods Outcome: Progressing   Problem: Respiratory: Goal: Ability to maintain a clear airway will improve Outcome: Progressing Goal: Levels of oxygenation will improve Outcome: Progressing Goal: Ability to maintain adequate ventilation will improve Outcome: Progressing   Problem: Education: Goal: Knowledge of risk factors and measures for prevention of condition will improve Outcome: Progressing   Problem: Coping: Goal: Psychosocial and spiritual needs will be supported Outcome: Progressing   Problem: Respiratory: Goal: Will maintain a patent airway Outcome: Progressing Goal: Complications related to the disease process, condition or treatment will be avoided or minimized Outcome: Progressing   Problem: Health Behavior/Discharge Planning: Goal: Ability to manage health-related needs will improve Outcome: Progressing   Problem: Clinical Measurements: Goal: Ability to maintain clinical measurements within normal limits will improve Outcome: Progressing Goal: Will remain free from infection Outcome: Progressing Goal: Diagnostic test results will improve Outcome: Progressing Goal: Respiratory complications will improve Outcome: Progressing   Problem: Activity: Goal: Risk for activity intolerance will decrease Outcome: Progressing   Problem: Pain Managment: Goal: General experience of comfort will improve Outcome: Progressing   Problem: Safety: Goal: Ability to remain free from injury will improve Outcome: Progressing   Problem: Skin  Integrity: Goal: Risk for impaired skin integrity will decrease Outcome: Progressing

## 2020-11-17 NOTE — Subjective & Objective (Signed)
CC: syncope HPI: 70 year old female with a history of hypertension, COPD, chronic hypoxic respiratory failure on home O2 at night 2 L/min, presents to the ER today with 2 episodes of syncope at home.  Patient lives at home with her husband and her daughter.  Patient states that last Monday, she had her grandson's over to the house.  Patient states that on Thursday, she started having nausea and vomiting.  There is no diarrhea.  Patient's breathing was about the same.  Nausea vomiting resolved by Saturday.  Today, the patient states that she had 2 episodes of syncope.  She states that she got up to use the restroom and proceeded to pass out after she stood up.  Her family picked her up off the floor.  She sat down for a while.  She try to do it again and she passed out again.  She came to the ER for evaluation.  In the ER, she was noted to be hypoxic with exertional room air saturations below 88%.  She was placed on supplemental oxygen.  COVID test was positive.  Chest x-ray demonstrated emphysema.  Due to the patient's worsening hypoxia, positive COVID test, [facility] hospitalist contacted for admission.  Patient is not immunized against COVID.

## 2020-11-17 NOTE — ED Provider Notes (Signed)
[facility] EMERGENCY DEPARTMENT Provider Note   CSN: [id number] Arrival date & time: 11/17/20  1315     History Chief Complaint  Patient presents with   Loss of Consciousness    Beth [name] is a 70 y.o. female.  70 year old female brought in by EMS from home with syncope x 2 today. The first episode occurred while walking across the room, felt light headed and couldn't get to a chair to sit down. Family fame to her aide and helped her up. A few minutes later patient got up to go get a glass of water and again felt weak and passed out.  No injuries from either event today.  Has been sick recently with vomiting for the past 3 days, has been trying to drink more fluids. Reports 4 episodes of non bloody emesis yesterday. No changes in stools. Denies fevers, chills, body aches, abdominal pain. Believes she is dehydrated, this has never happened before.  Patient is on oxygen at home for COPD, normal room air sat 88-92%. Found to be orthostatic with EMS.        Past Medical History:  Diagnosis Date   Age-related osteoporosis with current pathological fracture 03/11/2017   [facility]A bone [facility] 03/29/2017 T score -3.1 at Rt total femur   COPD (chronic obstructive pulmonary disease) (HCC)    Coronary atherosclerosis of native coronary artery 07/22/2017   Seen on chest Ct 07/21/17. Aortic and branch vessel atherosclerosis. Normal heart size, without pericardial effusion. Multivessel coronary artery atherosclerosis.   Hypertension    Non-traumatic compression fracture of vertebral column ([place]) 02/18/2017   Chest CT 07/21/17: Moderate T5 and moderate to severe T6 compression deformities w/o significant canal encroachment. Moderate T8 compression deformity is also w/o canal encroachment   Squamous cell cancer of external ear, right 03/2017   right, excison by Dr. Benjamine Mola 04/2017    Patient Active Problem List   Diagnosis Date Noted   COVID-19 virus infection 11/17/2020   Acute  on chronic respiratory failure with hypoxia ([place]) 11/17/2020   Hyponatremia 11/17/2020   Essential hypertension 04/04/2020   Chronic respiratory failure with hypoxia ([place]) 03/20/2020   Abnormality of left breast on screening mammogram 01/23/2018   History of tobacco use 07/22/2017   Coronary atherosclerosis of native coronary artery 07/22/2017   Collapse of right lung 07/22/2017   Age-related osteoporosis with current pathological fracture 03/11/2017   Chronic obstructive pulmonary disease ([place]) 03/11/2017   Acute bilateral thoracic back pain 03/11/2017   White coat syndrome with high blood pressure but without hypertension 03/11/2017    Past Surgical History:  Procedure Laterality Date   EYE SURGERY     MASS EXCISION Right 04/12/2017   Procedure: EXCISION OF RIGHT EAR  MASS;  Surgeon: Leta Baptist, MD;  Location: [place];  Service: ENT;  Laterality: Right; squamous cell cancer with clear margins   SKIN FULL THICKNESS GRAFT Left 04/12/2017   Procedure: SKIN GRAFT FULL THICKNESS FROM ABDOMEN TO RIGHT EAR;  Surgeon: Leta Baptist, MD;  Location: [place];  Service: ENT;  Laterality: Right   TONSILLECTOMY       OB History   No obstetric history on file.     Family History  Problem Relation Age of Onset   Heart attack Mother    Stroke Mother    Lung cancer Father    Arthritis Sister     Social History   Tobacco Use   Smoking status: Former  Types: Cigarettes    Quit date: 02/03/2017    Years since quitting: 3.7   Smokeless tobacco: Never  Vaping Use   Vaping Use: Never used  Substance Use Topics   Alcohol use: No   Drug use: No    Home Medications Prior to Admission medications   Medication Sig Start Date End Date Taking? Authorizing Provider  acetaminophen (TYLENOL) 500 MG tablet Take 500 mg by mouth every 6 (six) hours as needed for mild pain or moderate pain.   Yes [provider]  albuterol (VENTOLIN HFA) 108 (90 Base) MCG/ACT  inhaler Inhale 2 puffs into the lungs every 4 (four) hours as needed for wheezing or shortness of breath (cough, shortness of breath or wheezing.). 08/15/20  Yes Chesley Mires, MD  alendronate (FOSAMAX) 70 MG tablet Take 1 tablet (70 mg total) by mouth once a week. 05/28/20  Yes Just, Laurita Quint, FNP  aspirin EC 81 MG tablet Take 1 tablet (81 mg total) by mouth daily. 11/30/18  Yes Stallings, Zoe A, MD  Fluticasone-Umeclidin-Vilant (TRELEGY ELLIPTA) 100-62.5-25 MCG/INH AEPB Inhale 1 puff into the lungs daily. 08/15/20  Yes Chesley Mires, MD  hydrochlorothiazide (HYDRODIURIL) 25 MG tablet Take 1 tablet (25 mg total) by mouth daily. 03/20/20  Yes Just, Laurita Quint, FNP  naproxen sodium (ALEVE) 220 MG tablet Take 220 mg by mouth 2 (two) times daily as needed (for pain).   Yes [provider]  rosuvastatin (CRESTOR) 10 MG tablet Take 1 tablet (10 mg total) by mouth daily. 03/20/20  Yes Just, Laurita Quint, FNP    Allergies    Cefzil [cefprozil] and Zanaflex [tizanidine hcl]  Review of Systems   Review of Systems  Constitutional:  Negative for chills, diaphoresis and fever.  Eyes:  Negative for visual disturbance.  Respiratory:  Negative for shortness of breath.   Cardiovascular:  Negative for chest pain.  Gastrointestinal:  Positive for nausea and vomiting. Negative for abdominal pain, blood in stool, constipation and diarrhea.  Genitourinary:  Negative for decreased urine volume, difficulty urinating and dysuria.  Musculoskeletal:  Negative for arthralgias, back pain, myalgias, neck pain and neck stiffness.  Skin:  Negative for rash and wound.  Allergic/Immunologic: Negative for immunocompromised state.  Neurological:  Positive for syncope, weakness and light-headedness. Negative for speech difficulty and numbness.  Hematological:  Does not bruise/bleed easily.  Psychiatric/Behavioral:  Negative for confusion.   All other systems reviewed and are negative.  Physical Exam Updated Vital Signs BP 129/78    Pulse 66   Temp 99.1 F (37.3 C) (Oral)   Resp 15   Ht 5\' 2"  (1.575 m)   Wt 65.8 kg   SpO2 100%   BMI 26.52 kg/m   Physical Exam Vitals and nursing note reviewed.  Constitutional:      General: She is not in acute distress.    Appearance: She is well-developed. She is not diaphoretic.  HENT:     Head: Normocephalic and atraumatic.     Mouth/Throat:     Mouth: Mucous membranes are moist.  Eyes:     Extraocular Movements: Extraocular movements intact.     Pupils: Pupils are equal, round, and reactive to light.  Cardiovascular:     Rate and Rhythm: Normal rate and regular rhythm.     Pulses: Normal pulses.     Heart sounds: Normal heart sounds.  Pulmonary:     Effort: Pulmonary effort is normal.     Breath sounds: Normal breath sounds.  Abdominal:  Palpations: Abdomen is soft.     Tenderness: There is no abdominal tenderness.  Musculoskeletal:        General: No tenderness, deformity or signs of injury.     Cervical back: Neck supple.     Right lower leg: No edema.     Left lower leg: No edema.  Skin:    General: Skin is warm and dry.     Findings: No erythema or rash.  Neurological:     Mental Status: She is alert and oriented to person, place, and time.     Cranial Nerves: No cranial nerve deficit.     Sensory: No sensory deficit.     Motor: No weakness.  Psychiatric:        Behavior: Behavior normal.    ED Results / Procedures / Treatments   Labs (all labs ordered are listed, but only abnormal results are displayed) Labs Reviewed  RESP PANEL BY RT-PCR (FLU A&B, COVID) ARPGX2 - Abnormal; Notable for the following components:      Result Value   SARS Coronavirus 2 by RT PCR POSITIVE (*)    All other components within normal limits  BASIC METABOLIC PANEL - Abnormal; Notable for the following components:   Sodium 126 (*)    Potassium 3.2 (*)    Chloride 88 (*)    Glucose, Bld 112 (*)    Calcium 7.8 (*)    All other components within normal limits   D-DIMER, QUANTITATIVE - Abnormal; Notable for the following components:   D-Dimer, Quant 0.65 (*)    All other components within normal limits  FIBRINOGEN - Abnormal; Notable for the following components:   Fibrinogen 516 (*)    All other components within normal limits  CBG MONITORING, ED - Abnormal; Notable for the following components:   Glucose-Capillary 105 (*)    All other components within normal limits  CULTURE, BLOOD (ROUTINE X 2)  CULTURE, BLOOD (ROUTINE X 2)  CBC WITH DIFFERENTIAL/PLATELET  LACTIC ACID, PLASMA  URINALYSIS, ROUTINE W REFLEX MICROSCOPIC  LACTIC ACID, PLASMA  TSH  T4, FREE  C-REACTIVE PROTEIN  FERRITIN  LACTATE DEHYDROGENASE  PROCALCITONIN  TRIGLYCERIDES  CBG MONITORING, ED    EKG EKG Interpretation  Date/Time:  Sunday November 17 2020 15:32:57 EDT Ventricular Rate:  71 PR Interval:  134 QRS Duration: 103 QT Interval:  461 QTC Calculation: 501 R Axis:   65 Text Interpretation: Sinus rhythm Low voltage, precordial leads Prolonged QT interval similar earlier in the day Confirmed by Sherwood Gambler [phone]) on 11/17/2020 4:02:16 PM  Radiology DG Chest Port 1 View  Result Date: 11/17/2020 CLINICAL DATA:  Weakness, syncope EXAM: PORTABLE CHEST 1 VIEW COMPARISON:  02/12/2018 FINDINGS: Single frontal view of the chest demonstrates a stable cardiac silhouette. Chronic background scarring without airspace disease, effusion, or pneumothorax. No acute bony abnormalities. IMPRESSION: 1. Stable emphysema.  No acute process. Electronically Signed   By: Randa Ngo M.D.   On: 11/17/2020 15:04    Procedures .Critical Care Performed by: Tacy Learn, PA-C Authorized by: Tacy Learn, PA-C   Critical care provider statement:    Critical care time (minutes):  45   Critical care was time spent personally by me on the following activities:  Discussions with consultants, evaluation of patient's response to treatment, examination of patient, ordering and  performing treatments and interventions, ordering and review of laboratory studies, ordering and review of radiographic studies, pulse oximetry, re-evaluation of patient's condition, obtaining history from patient or surrogate and  review of old charts   Medications Ordered in ED Medications  sodium chloride 0.9 % bolus 1,000 mL (1,000 mLs Intravenous New Bag/Given 11/17/20 1531)    And  0.9 %  sodium chloride infusion (has no administration in time range)  remdesivir 100 mg in sodium chloride 0.9 % 100 mL IVPB (has no administration in time range)    And  remdesivir 100 mg in sodium chloride 0.9 % 100 mL IVPB (has no administration in time range)  potassium chloride SA (KLOR-CON) CR tablet 40 mEq (40 mEq Oral Given 11/17/20 1519)  dexamethasone (DECADRON) injection 10 mg (10 mg Intravenous Given 11/17/20 1637)    ED Course  I have reviewed the triage vital signs and the nursing notes.  Pertinent labs & imaging results that were available during my care of the patient were reviewed by me and considered in my medical decision making (see chart for details).  Clinical Course as of 11/17/20 1643  Sun Nov 17, 6329  436 70 year old female with history of emphysema presents with complaint of vomiting for the past few days and now 2 syncopal episodes today.  Reports feeling weak and lightheaded prior to the episodes, no injuries as result of the episodes, not anticoagulated.  Exam is unremarkable. Chest x-ray shows emphysema without other acute changes. CBC within normal limits, BMP with hyponatremia with sodium of 126, hypokalemia with potassium of 3.2.  Given oral potassium.  COVID positive. Patient is normally only on 2 L nasal cannula at night.  States her normal O2 sats ranged from 88 to 92%. While at rest, she is able to maintain O2 sats in the low 90s however with minimal exertion/rolling over in bed, her O2 sat drops to 81%.  Patient is placed on nasal cannula in the ED with plan to admit for  Decadron, remdesivir, monitoring. Case discussed with Dr. Vallarie Mare with [facility] hospitalist service will consult for admission. [LM]    Clinical Course User Index [LM] Roque Lias   MDM Rules/Calculators/A&P                            Final Clinical Impression(s) / ED Diagnoses Final diagnoses:  COVID  Syncope and collapse  Hypokalemia  Hyponatremia  Hypoxia    Rx / DC Orders ED Discharge Orders     None        Tacy Learn, PA-C 11/17/20 1643    Noemi Chapel, MD 11/18/20 [phone]

## 2020-11-17 NOTE — ED Notes (Addendum)
Any type of pt movement in bed causes SpO2 to drop to mid to low 80's and pt becomes labored. Lowest SpO2 81% Resp effort and SpO2 at rest remains 92% while on 2L via N/C.

## 2020-11-17 NOTE — H&P (Addendum)
History and Physical    Beth [name] LKT:[id number] DOB: 1950-10-22 DOA: 11/17/2020  PCP: Isaac Bliss, Rayford Halsted, MD   Patient coming from: Home  I have personally briefly reviewed patient's old medical records in [place]  CC: syncope HPI: 70 year old female with a history of hypertension, COPD, chronic hypoxic respiratory failure on home O2 at night 2 L/min, presents to the ER today with 2 episodes of syncope at home.  Patient lives at home with her husband and her daughter.  Patient states that last Monday, she had her grandson's over to the house.  Patient states that on Thursday, she started having nausea and vomiting.  There is no diarrhea.  Patient's breathing was about the same.  Nausea vomiting resolved by Saturday.  Today, the patient states that she had 2 episodes of syncope.  She states that she got up to use the restroom and proceeded to pass out after she stood up.  Her family picked her up off the floor.  She sat down for a while.  She try to do it again and she passed out again.  She came to the ER for evaluation.  In the ER, she was noted to be hypoxic with exertional room air saturations below 88%.  She was placed on supplemental oxygen.  COVID test was positive.  Chest x-ray demonstrated emphysema.  Due to the patient's worsening hypoxia, positive COVID test, [facility] contacted for admission.  Patient is not immunized against COVID.   ED Course: noted to have exertional hypoxia with O2 sats < 88% with exertion. Covid test Positive. Started on remdesivir and decadron  Review of Systems:  Review of Systems  Constitutional: Negative.   HENT: Negative.    Eyes: Negative.   Respiratory:         Uses nocturnal 2 L/min O2. Pt states she does not use supplemental O2 during the day.  Cardiovascular: Negative.   Gastrointestinal:  Positive for nausea and vomiting. Negative for abdominal pain and diarrhea.  Genitourinary: Negative.    Musculoskeletal: Negative.   Skin: Negative.   Neurological:  Positive for loss of consciousness.  Endo/Heme/Allergies: Negative.   Psychiatric/Behavioral: Negative.    All other systems reviewed and are negative.  Past Medical History:  Diagnosis Date   Age-related osteoporosis with current pathological fracture 03/11/2017   DEXA bone scan at [facility] 03/29/2017 T score -3.1 at Rt total femur   COPD (chronic obstructive pulmonary disease) (HCC)    Coronary atherosclerosis of native coronary artery 07/22/2017   Seen on chest Ct 07/21/17. Aortic and branch vessel atherosclerosis. Normal heart size, without pericardial effusion. Multivessel coronary artery atherosclerosis.   Hypertension    Non-traumatic compression fracture of vertebral column ([place]) 02/18/2017   Chest CT 07/21/17: Moderate T5 and moderate to severe T6 compression deformities w/o significant canal encroachment. Moderate T8 compression deformity is also w/o canal encroachment   Squamous cell cancer of external ear, right 03/2017   right, excison by Dr. Benjamine Mola 04/2017    Past Surgical History:  Procedure Laterality Date   EYE SURGERY     MASS EXCISION Right 04/12/2017   Procedure: EXCISION OF RIGHT EAR  MASS;  Surgeon: Leta Baptist, MD;  Location: [place];  Service: ENT;  Laterality: Right; squamous cell cancer with clear margins   SKIN FULL THICKNESS GRAFT Left 04/12/2017   Procedure: SKIN GRAFT FULL THICKNESS FROM ABDOMEN TO RIGHT EAR;  Surgeon: Leta Baptist, MD;  Location: [place];  Service:  ENT;  Laterality: Right   TONSILLECTOMY       reports that she quit smoking about 3 years ago. Her smoking use included cigarettes. She has never used smokeless tobacco. She reports that she does not drink alcohol and does not use drugs.  Allergies  Allergen Reactions   Cefzil [Cefprozil] Other (See Comments)    Burning sensation in lower legs.    Zanaflex [Tizanidine Hcl] Nausea And Vomiting     Family History  Problem Relation Age of Onset   Heart attack Mother    Stroke Mother    Lung cancer Father    Arthritis Sister     Prior to Admission medications   Medication Sig Start Date End Date Taking? Authorizing Provider  acetaminophen (TYLENOL) 500 MG tablet Take 500 mg by mouth every 6 (six) hours as needed for mild pain or moderate pain.   Yes [provider]  albuterol (VENTOLIN HFA) 108 (90 Base) MCG/ACT inhaler Inhale 2 puffs into the lungs every 4 (four) hours as needed for wheezing or shortness of breath (cough, shortness of breath or wheezing.). 08/15/20  Yes Chesley Mires, MD  alendronate (FOSAMAX) 70 MG tablet Take 1 tablet (70 mg total) by mouth once a week. 05/28/20  Yes Just, Laurita Quint, FNP  aspirin EC 81 MG tablet Take 1 tablet (81 mg total) by mouth daily. 11/30/18  Yes Stallings, Zoe A, MD  Fluticasone-Umeclidin-Vilant (TRELEGY ELLIPTA) 100-62.5-25 MCG/INH AEPB Inhale 1 puff into the lungs daily. 08/15/20  Yes Chesley Mires, MD  hydrochlorothiazide (HYDRODIURIL) 25 MG tablet Take 1 tablet (25 mg total) by mouth daily. 03/20/20  Yes Just, Laurita Quint, FNP  naproxen sodium (ALEVE) 220 MG tablet Take 220 mg by mouth 2 (two) times daily as needed (for pain).   Yes [provider]  rosuvastatin (CRESTOR) 10 MG tablet Take 1 tablet (10 mg total) by mouth daily. 03/20/20  Yes Just, Laurita Quint, FNP    Physical Exam: Vitals:   11/17/20 1329 11/17/20 1330 11/17/20 1428 11/17/20 1529  BP: 139/70  126/66 129/78  Pulse: 70  81 66  Resp: 16  18 15   Temp: 99.1 F (37.3 C)     TempSrc: Oral     SpO2:   93% 100%  Weight:  65.8 kg    Height:  5\' 2"  (1.575 m)      Physical Exam Vitals and nursing note reviewed.  Constitutional:      General: She is not in acute distress.    Appearance: She is normal weight. She is not ill-appearing, toxic-appearing or diaphoretic.  HENT:     Head: Normocephalic and atraumatic.     Nose:     Comments: Abrasion on the tip of her  nose. Pt states that this is her skin cancer. Eyes:     General: No scleral icterus. Cardiovascular:     Rate and Rhythm: Normal rate and regular rhythm.     Pulses: Normal pulses.  Pulmonary:     Effort: No respiratory distress or retractions.     Breath sounds: Examination of the right-upper field reveals decreased breath sounds. Examination of the left-upper field reveals decreased breath sounds. Examination of the right-middle field reveals decreased breath sounds. Examination of the left-middle field reveals decreased breath sounds. Examination of the right-lower field reveals rales. Examination of the left-lower field reveals rales. Decreased breath sounds and rales present. No wheezing or rhonchi.  Abdominal:     General: Abdomen is flat. Bowel sounds are normal. There is  no distension.     Palpations: Abdomen is soft.     Tenderness: There is no abdominal tenderness. There is no guarding or rebound.  Musculoskeletal:     Right lower leg: No edema.     Left lower leg: No edema.  Skin:    General: Skin is warm and dry.     Capillary Refill: Capillary refill takes less than 2 seconds.  Neurological:     General: No focal deficit present.     Mental Status: She is alert and oriented to person, place, and time.     Labs on Admission: I have personally reviewed following labs and imaging studies  CBC: Recent Labs  Lab 11/17/20 1406  WBC 4.0  NEUTROABS 2.7  HGB 14.2  HCT 42.3  MCV 91.8  PLT 088   Basic Metabolic Panel: Recent Labs  Lab 11/17/20 1406  NA 126*  K 3.2*  CL 88*  CO2 28  GLUCOSE 112*  BUN 12  CREATININE 0.64  CALCIUM 7.8*   GFR: Estimated Creatinine Clearance: 56.6 mL/min (by C-G formula based on SCr of 0.64 mg/dL). Liver Function Tests: No results for input(s): AST, ALT, ALKPHOS, BILITOT, PROT, ALBUMIN in the last 168 hours. No results for input(s): LIPASE, AMYLASE in the last 168 hours. No results for input(s): AMMONIA in the last 168  hours. Coagulation Profile: No results for input(s): INR, PROTIME in the last 168 hours. Cardiac Enzymes: No results for input(s): CKTOTAL, CKMB, CKMBINDEX, TROPONINI in the last 168 hours. BNP (last 3 results) No results for input(s): PROBNP in the last 8760 hours. HbA1C: No results for input(s): HGBA1C in the last 72 hours. CBG: Recent Labs  Lab 11/17/20 1421 11/17/20 1614  GLUCAP 105* 94   Lipid Profile: No results for input(s): CHOL, HDL, LDLCALC, TRIG, CHOLHDL, LDLDIRECT in the last 72 hours. Thyroid Function Tests: No results for input(s): TSH, T4TOTAL, FREET4, T3FREE, THYROIDAB in the last 72 hours. Anemia Panel: No results for input(s): VITAMINB12, FOLATE, FERRITIN, TIBC, IRON, RETICCTPCT in the last 72 hours. Urine analysis:    Component Value Date/Time   COLORURINE YELLOW 02/23/2015 2028   APPEARANCEUR CLEAR 02/23/2015 2028   LABSPEC 1.020 02/23/2015 2028   PHURINE 6.0 02/23/2015 2028   GLUCOSEU NEGATIVE 02/23/2015 2028   HGBUR NEGATIVE 02/23/2015 2028   BILIRUBINUR small (A) 02/18/2017 1524   KETONESUR small (15) (A) 02/18/2017 1524   KETONESUR 40 (A) 02/23/2015 2028   PROTEINUR negative 02/18/2017 1524   PROTEINUR 30 (A) 02/23/2015 2028   UROBILINOGEN 0.2 02/18/2017 1524   NITRITE Negative 02/18/2017 1524   NITRITE NEGATIVE 02/23/2015 2028   LEUKOCYTESUR Negative 02/18/2017 1524    Radiological Exams on Admission: I have personally reviewed images DG Chest Port 1 View  Result Date: 11/17/2020 CLINICAL DATA:  Weakness, syncope EXAM: PORTABLE CHEST 1 VIEW COMPARISON:  02/12/2018 FINDINGS: Single frontal view of the chest demonstrates a stable cardiac silhouette. Chronic background scarring without airspace disease, effusion, or pneumothorax. No acute bony abnormalities. IMPRESSION: 1. Stable emphysema.  No acute process. Electronically Signed   By: Randa Ngo M.D.   On: 11/17/2020 15:04    EKG: I have personally reviewed EKG: shows  NSR   Assessment/Plan Principal Problem:   COVID-19 virus infection Active Problems:   Acute on chronic respiratory failure with hypoxia (HCC)   Chronic obstructive pulmonary disease (HCC)   Essential hypertension   Hyponatremia    COVID-19 virus infection Admit to med/surg bed. IV remdesivir and po decadron. Follow inflammatory markers.  lovenox for DVT prophylaxis.  Acute on chronic respiratory failure with hypoxia (HCC) Continue supplemental O2. RT assessment.  Chronic obstructive pulmonary disease (HCC) Prn duonebs.  Essential hypertension Due to hyponatremia, will hold HCTZ.  Hyponatremia Check TSH, FT4, suspect hyponatremia due to HCTZ. Will check CMP in AM. Pt received IVF in ER.  DVT prophylaxis: Lovenox Code Status: Full Code Family Communication: no family at bedside  Disposition Plan: return to home  Consults called: none  Admission status: Inpatient, Med-Surg   Kristopher Oppenheim, DO [facility] 11/17/2020, 4:34 PM

## 2020-11-17 NOTE — Assessment & Plan Note (Signed)
Check TSH, FT4, suspect hyponatremia due to HCTZ. Will check CMP in AM. Pt received IVF in ER.

## 2020-11-17 NOTE — Assessment & Plan Note (Signed)
Continue supplemental O2. RT assessment.

## 2020-11-18 DIAGNOSIS — E871 Hypo-osmolality and hyponatremia: Secondary | ICD-10-CM

## 2020-11-18 LAB — COMPREHENSIVE METABOLIC PANEL
ALT: 34 U/L (ref 0–44)
AST: 49 U/L — ABNORMAL HIGH (ref 15–41)
Albumin: 3.8 g/dL (ref 3.5–5.0)
Alkaline Phosphatase: 39 U/L (ref 38–126)
Anion gap: 10 (ref 5–15)
BUN: 12 mg/dL (ref 8–23)
CO2: 25 mmol/L (ref 22–32)
Calcium: 7.4 mg/dL — ABNORMAL LOW (ref 8.9–10.3)
Chloride: 102 mmol/L (ref 98–111)
Creatinine, Ser: 0.57 mg/dL (ref 0.44–1.00)
GFR, Estimated: >60 mL/min (ref >60)
Glucose, Bld: 102 mg/dL — ABNORMAL HIGH (ref 70–99)
Potassium: 3.4 mmol/L — ABNORMAL LOW (ref 3.5–5.1)
Sodium: 137 mmol/L (ref 135–145)
Total Bilirubin: 0.5 mg/dL (ref 0.3–1.2)
Total Protein: 6.9 g/dL (ref 6.5–8.1)

## 2020-11-18 LAB — CBC WITH DIFFERENTIAL/PLATELET
Abs Immature Granulocytes: 0.01 10*3/uL (ref 0.00–0.07)
Basophils Absolute: 0 10*3/uL (ref 0.0–0.1)
Basophils Relative: 0 %
Eosinophils Absolute: 0 10*3/uL (ref 0.0–0.5)
Eosinophils Relative: 0 %
HCT: 41 % (ref 36.0–46.0)
Hemoglobin: 13.9 g/dL (ref 12.0–15.0)
Immature Granulocytes: 0 %
Lymphocytes Relative: 28 %
Lymphs Abs: 1 10*3/uL (ref 0.7–4.0)
MCH: 30.8 pg (ref 26.0–34.0)
MCHC: 33.9 g/dL (ref 30.0–36.0)
MCV: 90.9 fL (ref 80.0–100.0)
Monocytes Absolute: 0.6 10*3/uL (ref 0.1–1.0)
Monocytes Relative: 15 %
Neutro Abs: 2.1 10*3/uL (ref 1.7–7.7)
Neutrophils Relative %: 57 %
Platelets: 200 10*3/uL (ref 150–400)
RBC: 4.51 MIL/uL (ref 3.87–5.11)
RDW: 13.2 % (ref 11.5–15.5)
WBC: 3.7 10*3/uL — ABNORMAL LOW (ref 4.0–10.5)
nRBC: 0 % (ref 0.0–0.2)

## 2020-11-18 LAB — LACTATE DEHYDROGENASE: LDH: 190 U/L (ref 98–192)

## 2020-11-18 LAB — FERRITIN: Ferritin: 386 ng/mL — ABNORMAL HIGH (ref 11–307)

## 2020-11-18 LAB — C-REACTIVE PROTEIN: CRP: <0.5 mg/dL (ref <1.0)

## 2020-11-18 MED ORDER — POTASSIUM CHLORIDE CRYS ER 20 MEQ PO TBCR
40.0000 meq | EXTENDED_RELEASE_TABLET | Freq: Once | ORAL | Status: AC
  Administered 2020-11-18: 40 meq via ORAL
  Filled 2020-11-18: qty 2

## 2020-11-18 MED ORDER — ACETAMINOPHEN 325 MG PO TABS: 650.0000 mg | ORAL_TABLET | Freq: Four times a day (QID) | ORAL | Status: DC | PRN

## 2020-11-18 MED ORDER — AMLODIPINE BESYLATE 5 MG PO TABS
5.0000 mg | ORAL_TABLET | Freq: Every day | ORAL | Status: DC
  Administered 2020-11-18 – 2020-11-19 (×2): 5 mg via ORAL
  Filled 2020-11-18 (×2): qty 1

## 2020-11-18 MED ORDER — IPRATROPIUM-ALBUTEROL 0.5-2.5 (3) MG/3ML IN SOLN: 3.0000 mL | RESPIRATORY_TRACT | Status: DC | PRN

## 2020-11-18 MED ORDER — ENOXAPARIN SODIUM 40 MG/0.4ML IJ SOSY
40.0000 mg | PREFILLED_SYRINGE | Freq: Every day | INTRAMUSCULAR | Status: DC
  Administered 2020-11-19: 40 mg via SUBCUTANEOUS
  Filled 2020-11-18 (×2): qty 0.4

## 2020-11-18 MED ORDER — ONDANSETRON HCL 4 MG PO TABS: 4.0000 mg | ORAL_TABLET | Freq: Four times a day (QID) | ORAL | Status: DC | PRN

## 2020-11-18 MED ORDER — ASPIRIN EC 81 MG PO TBEC
81.0000 mg | DELAYED_RELEASE_TABLET | Freq: Every day | ORAL | Status: DC
  Administered 2020-11-18 – 2020-11-20 (×3): 81 mg via ORAL
  Filled 2020-11-18 (×3): qty 1

## 2020-11-18 MED ORDER — MELATONIN 5 MG PO TABS
5.0000 mg | ORAL_TABLET | Freq: Every day | ORAL | Status: DC
  Administered 2020-11-18: 5 mg via ORAL
  Filled 2020-11-18 (×2): qty 1

## 2020-11-18 MED ORDER — GUAIFENESIN-DM 100-10 MG/5ML PO SYRP: 10.0000 mL | ORAL_SOLUTION | ORAL | Status: DC | PRN

## 2020-11-18 MED ORDER — DEXAMETHASONE 4 MG PO TABS
6.0000 mg | ORAL_TABLET | Freq: Every day | ORAL | Status: DC
  Administered 2020-11-18 – 2020-11-20 (×3): 6 mg via ORAL
  Filled 2020-11-18 (×3): qty 1

## 2020-11-18 MED ORDER — ENOXAPARIN SODIUM 40 MG/0.4ML IJ SOSY
40.0000 mg | PREFILLED_SYRINGE | INTRAMUSCULAR | Status: DC
  Administered 2020-11-18: 40 mg via SUBCUTANEOUS
  Filled 2020-11-18: qty 0.4

## 2020-11-18 MED ORDER — ONDANSETRON HCL 4 MG/2ML IJ SOLN: 4.0000 mg | Freq: Four times a day (QID) | INTRAMUSCULAR | Status: DC | PRN

## 2020-11-18 MED ORDER — ROSUVASTATIN CALCIUM 10 MG PO TABS
10.0000 mg | ORAL_TABLET | Freq: Every day | ORAL | Status: DC
  Administered 2020-11-18 – 2020-11-20 (×3): 10 mg via ORAL
  Filled 2020-11-18 (×3): qty 1

## 2020-11-18 MED ORDER — IPRATROPIUM-ALBUTEROL 20-100 MCG/ACT IN AERS
1.0000 | INHALATION_SPRAY | RESPIRATORY_TRACT | Status: DC | PRN
  Filled 2020-11-18: qty 4

## 2020-11-18 MED ORDER — ENOXAPARIN SODIUM 40 MG/0.4ML IJ SOSY: 40.0000 mg | PREFILLED_SYRINGE | Freq: Every day | INTRAMUSCULAR | Status: DC

## 2020-11-18 NOTE — Evaluation (Signed)
Occupational Therapy Evaluation Patient Details Name: Beth [name] MRN: [id number] DOB: [birth year] Today's Date: 11/18/2020   History of Present Illness Patient is a 70 year old female who presented to the hospital on 9/11 with weakness, nausea, vomitting and falls. patient was found to have COVID 19. DUK:GURK 2L/min @ night and HTN.   Clinical Impression   Patient is a 70 year old female who was admitted for above. Patient was living at home with husband with independence in ADLs and IADLs. Currently, patient was noted to require increased O2 at 2L/min with patient dropping to 80% after toilieting task requiring 2L/min to deep breath O2 saturation back up to 92% within 1 min 30 seconds.patient was able to maintain O2 saturation on way to commode on RA with maintaining 95% on RA. Patient was noted to have increased fatigue, decreased endurance, decreased standing balance, decreased functional activity tolerance impacting participation in ADLs. Patient would continue to benefit from skilled OT services at this time while admitted to address noted deficits in order to improve overall safety and independence in ADLs.        Recommendations for follow up therapy are one component of a multi-disciplinary discharge planning process, led by the attending physician.  Recommendations may be updated based on patient status, additional functional criteria and insurance authorization.   Follow Up Recommendations  No OT follow up    Equipment Recommendations  None recommended by OT    Recommendations for Other Services       Precautions / Restrictions Precautions Precaution Comments: monitor O2 Restrictions Weight Bearing Restrictions: No      Mobility Bed Mobility Overal bed mobility: Needs Assistance Bed Mobility: Supine to Sit     Supine to sit: HOB elevated;Min guard          Transfers Overall transfer level: Needs assistance Equipment used: 1 person hand held  assist Transfers: Sit to/from [facility] Sit to Stand: Min guard Stand pivot transfers: Min guard       General transfer comment: patient noted to attempt furniture walking with functional mobilty in room.    Balance Overall balance assessment: Mild deficits observed, not formally tested                                         ADL either performed or assessed with clinical judgement   ADL Overall ADL's : Needs assistance/impaired Eating/Feeding: Modified independent   Grooming: Standing;Oral care;Wash/dry face;Min guard   Upper Body Bathing: Min guard;Standing   Lower Body Bathing: Sit to/from stand;Minimal assistance   Upper Body Dressing : Standing;Min guard   Lower Body Dressing: Min guard;Sit to/from stand   Toilet Transfer: Magazine features editor Details (indicate cue type and reason): patient noted to attempt to furniture walk during functional mobility. patient was noted to maintain O2 saturation on RA 95% to get to commode but dropped to 86% on way back to chair in room on RA. needed supplemental O2 to come back up to 90s with 1 min 30 seconds. Toileting- Water quality scientist and Hygiene: Min guard;Sit to/from stand       Functional mobility during ADLs: Min guard       Vision Baseline Vision/History: 1 Wears glasses Ability to See in Adequate Light: 1 Impaired Patient Visual Report: No change from baseline       Perception     Praxis  Pertinent Vitals/Pain Pain Assessment: No/denies pain     Hand Dominance Right   Extremity/Trunk Assessment Upper Extremity Assessment Upper Extremity Assessment: Overall WFL for tasks assessed   Lower Extremity Assessment Lower Extremity Assessment: Defer to PT evaluation   Cervical / Trunk Assessment Cervical / Trunk Assessment: Normal   Communication Communication Communication: No difficulties   Cognition Arousal/Alertness: Awake/alert Behavior During Therapy: WFL  for tasks assessed/performed Overall Cognitive Status: Within Functional Limits for tasks assessed                                     General Comments  patient was noted to have redness and edema near IV site. nurse is aware. Patient was educate don importance of incentive spirometer to reduce need for supplemental O2.    Exercises     Shoulder Instructions      Home Living Family/patient expects to be discharged to:: Private residence Living Arrangements: Spouse/significant other;Children Available Help at Discharge: Family;Available PRN/intermittently (husband and daughter work during the day) Type of Home: House Home Access: Stairs to enter Technical brewer of Steps: 3-4 steps Entrance Stairs-Rails: Right Home Layout: One level     Bathroom Shower/Tub: Chief Strategy Officer: None          Prior Functioning/Environment Level of Independence: Independent        Comments: cooking and cleaning with husband        OT Problem List: Decreased activity tolerance;Impaired balance (sitting and/or standing);Decreased safety awareness;Decreased knowledge of use of DME or AE      OT Treatment/Interventions: Self-care/ADL training;Therapeutic exercise;DME and/or AE instruction;Energy conservation;Balance training;Patient/family education    OT Goals(Current goals can be found in the care plan section) Acute Rehab OT Goals Patient Stated Goal: to go home OT Goal Formulation: With patient Time For Goal Achievement: 12/02/20 Potential to Achieve Goals: Good  OT Frequency: Min 2X/week   Barriers to D/C:            Co-evaluation              AM-PAC OT "6 Clicks" Daily Activity     Outcome Measure Help from another person eating meals?: None Help from another person taking care of personal grooming?: A Little Help from another person toileting, which includes using toliet, bedpan, or urinal?: A Little Help from another  person bathing (including washing, rinsing, drying)?: A Little Help from another person to put on and taking off regular upper body clothing?: A Little Help from another person to put on and taking off regular lower body clothing?: A Little 6 Click Score: 19   End of Session Nurse Communication: Mobility status;Other (comment) (O2 saturation)  Activity Tolerance: Patient tolerated treatment well Patient left: in chair;with call bell/phone within reach  OT Visit Diagnosis: Unsteadiness on feet (R26.81);Muscle weakness (generalized) (M62.81)                Time: 2694-8546 OT Time Calculation (min): 28 min Charges:  OT General Charges $OT Visit: 1 Visit OT Evaluation $OT Eval Low Complexity: 1 Low OT Treatments $Self Care/Home Management : 8-22 mins  Jackelyn Poling OTR/L, MS Acute Rehabilitation Department Office# [phone] Pager# [phone]   [place] 11/18/2020, 3:24 PM

## 2020-11-18 NOTE — Evaluation (Signed)
Physical Therapy Evaluation Patient Details Name: Beth [name] MRN: [id number] DOB: 04-28-1950 Today's Date: 11/18/2020  History of Present Illness  Patient is a 70 year old female who presented to the hospital on 9/11 with weakness, nausea, vomitting and falls. patient was found to have COVID 19. BHA:LPFX 2L/min @ night and HTN.  Clinical Impression  Patient evaluated by Physical Therapy with no further acute PT needs identified. All education has been completed and the patient has no further questions.  See below for any follow-up Physical Therapy or equipment needs. PT is signing off. Thank you for this referral.     Pt has O2 at home, wears only at night @ 2L, SpO2 on RA during amb 84%, 92-95% on 2.5 L with one extension tube.   Recommendations for follow up therapy are one component of a multi-disciplinary discharge planning process, led by the attending physician.  Recommendations may be updated based on patient status, additional functional criteria and insurance authorization.  Follow Up Recommendations No PT follow up    Equipment Recommendations  None recommended by PT    Recommendations for Other Services       Precautions / Restrictions Precautions Precaution Comments: monitor O2 Restrictions Weight Bearing Restrictions: No      Mobility  Bed Mobility Overal bed mobility: Needs Assistance Bed Mobility: Supine to Sit     Supine to sit: HOB elevated;Min guard     General bed mobility comments: in recliner    Transfers Overall transfer level: Needs assistance Equipment used: None Transfers: Sit to/from Stand Sit to Stand: Supervision;Modified independent (Device/Increase time) Stand pivot transfers: Min guard       General transfer comment: no assist from recliner, bed or toilet  Ambulation/Gait Ambulation/Gait assistance: Supervision;Modified independent (Device/Increase time) Gait Distance (Feet): 60 Feet Assistive device: None Gait  Pattern/deviations: Step-through pattern Gait velocity: difficult to test on confines of room   General Gait Details: amb in room, no LOB, steady without external support did not attempt furniture amb with PT (did so during OT eval). pt is able to negotiate in tighter spaces of room  Stairs            Wheelchair Mobility    Modified Rankin (Stroke Patients Only)       Balance Overall balance assessment: Mild deficits observed, not formally tested   Sitting balance-Leahy Scale: Normal         Standing balance comment: Fair to Good, not tested to mod challenges             High level balance activites: Backward walking;Direction changes;Turns;Head turns High Level Balance Comments: no LOB with above activities             Pertinent Vitals/Pain Pain Assessment: No/denies pain    Home Living Family/patient expects to be discharged to:: Private residence Living Arrangements: Spouse/significant other;Children (dtr works from home) Available Help at Discharge: Family;Available PRN/intermittently Type of Home: House Home Access: Stairs to enter Entrance Stairs-Rails: Right Entrance Stairs-Number of Steps: 3-4 steps Home Layout: One level Home Equipment: None      Prior Function Level of Independence: Independent         Comments: cooking and cleaning with husband     Hand Dominance   Dominant Hand: Right    Extremity/Trunk Assessment   Upper Extremity Assessment Upper Extremity Assessment: Defer to OT evaluation;Overall [facility] for tasks assessed    Lower Extremity Assessment Lower Extremity Assessment: Overall WFL for tasks assessed    Cervical /  Trunk Assessment Cervical / Trunk Assessment: Normal  Communication   Communication: No difficulties  Cognition Arousal/Alertness: Awake/alert Behavior During Therapy: WFL for tasks assessed/performed Overall Cognitive Status: Within Functional Limits for tasks assessed                                         General Comments General comments (skin integrity, edema, etc.): bleeding and redness around IV site--pt is concerned, RN is aware    Exercises Other Exercises Other Exercises: educated on sit<>stand for exercise/activity tol Other Exercises: reviewed IS and encouraged continued use at home   Assessment/Plan    PT Assessment Patent does not need any further PT services  PT Problem List         PT Treatment Interventions      PT Goals (Current goals can be found in the Care Plan section)  Acute Rehab PT Goals Patient Stated Goal: to go home and see her dog PT Goal Formulation: All assessment and education complete, DC therapy    Frequency     Barriers to discharge        Co-evaluation               AM-PAC PT "6 Clicks" Mobility  Outcome Measure Help needed turning from your back to your side while in a flat bed without using bedrails?: None Help needed moving from lying on your back to sitting on the side of a flat bed without using bedrails?: None Help needed moving to and from a bed to a chair (including a wheelchair)?: None Help needed standing up from a chair using your arms (e.g., wheelchair or bedside chair)?: None Help needed to walk in hospital room?: None Help needed climbing 3-5 steps with a railing? : A Little 6 Click Score: 23    End of Session   Activity Tolerance: Patient tolerated treatment well Patient left: with call bell/phone within reach;in chair (in chair on arrival)   PT Visit Diagnosis: Difficulty in walking, not elsewhere classified (R26.2)    Time: 1610-9604 PT Time Calculation (min) (ACUTE ONLY): 20 min   Charges:   PT Evaluation $PT Eval Low Complexity: 1 Low          Loel Betancur, PT  Acute Rehab Dept ([place]) [phone] Pager [phone]  11/18/2020   [facility] 11/18/2020, 4:06 PM

## 2020-11-18 NOTE — Progress Notes (Signed)
PROGRESS NOTE  Beth [name]  OJJ:[id number] DOB: 12/29/1950 DOA: 11/17/2020 PCP: Isaac Bliss, Rayford Halsted, MD   Brief Narrative: Beth [name] is a 70 y.o. female with a history of COPD on nocturnal 2L O2, HTN who presented to the ED 9/11 with several episodes of weakness leading to falls, reported passing out, in the setting of nausea, vomiting, poor per oral intake. In the ED SpO2 was 88% on room air with exertion, covid-19 PCR was positive, though CXR demonstrated no acute infiltrates on emphysematous background. She was started on remdesivir and decadron, admitted.   Assessment & Plan: Principal Problem:   COVID-19 virus infection Active Problems:   Chronic obstructive pulmonary disease ([place])   Essential hypertension   Acute on chronic respiratory failure with hypoxia (HCC)   Hyponatremia  Acute on chronic hypoxemic respiratory failure due to covid-19 infection on COPD: SARS-CoV-2 PCR positive on 9/11, unvaccinated. CXR without pneumonia. CRP reassuring at 0.6, DD at age-adjusted normal.  - Continue remdesivir for at least 3 days. If clinically improved, could be candidate for discharge 9/13 after 3rd dose, though remains at high risk of complications with premorbid hypoxemia at baseline. Ambulatory pulse ox ordered. - Continue steroids with decadron due to hypoxemia, without concern for COPD exacerbation at this time. - Encourage OOB with assistance, IS - Continue airborne, contact precautions for 10 days from positive testing. - Monitor CMP and inflammatory markers - Enoxaparin prophylactic dose.  - Inhaled Tx's to continue.   Syncope: NSR only on telemetry review thus far. Symptoms and recent history of poor oral intake with vomiting suggest orthostasis is primary etiology.  - Will have to continue telemetry for now.   Hyponatremia: Hypovolemic most likely.  - Continue holding thiazide. Will not continue IVF with abrupt correction.  Hypokalemia:  - Repeat  supplement and monitor  AST elevation: Likely due to covid.  - Will monitor as we're giving remdesivir.   HTN:  - Continue norvasc, hold thiazide  HLD:  - Continue statin  DVT prophylaxis: Lovenox Code Status: Full Family Communication: None at bedside Disposition Plan:  Status is: Inpatient  Remains inpatient appropriate because:Inpatient level of care appropriate due to severity of illness  Dispo: The patient is from: Home              Anticipated d/c is to: Home              Patient currently is not medically stable to d/c.   Difficult to place patient No  Consultants:  None  Procedures:  None  Antimicrobials: Remdesivir   Subjective: Wants to go home. Feels her breathing is much better, still with cough, hasn't been getting out of bed. No chest pain or palpitations.   Objective: Vitals:   11/18/20 0350 11/18/20 0828 11/18/20 1109 11/18/20 1312  BP: 140/61 (!) 167/82 (!) 158/84 (!) 166/85  Pulse: (!) 58 78  82  Resp: 15 18    Temp: 97.8 F (36.6 C) 98.5 F (36.9 C)  98.1 F (36.7 C)  TempSrc: Oral Oral  Oral  SpO2: 98%     Weight:      Height:        Intake/Output Summary (Last 24 hours) at 11/18/2020 1408 Last data filed at 11/18/2020 1009 Gross per 24 hour  Intake 1233.87 ml  Output 4 ml  Net 1229.87 ml   Filed Weights   11/17/20 1330  Weight: 65.8 kg    Gen: 70 y.o. female in no distress  Pulm: Non-labored  breathing 2L O2, diminished without wheezes or crackles.  CV: Regular rate and rhythm. No murmur, rub, or gallop. No JVD, no pedal edema. GI: Abdomen soft, non-tender, non-distended, with normoactive bowel sounds. No organomegaly or masses felt. Ext: Warm, no deformities Skin: No rashes, lesions or ulcers Neuro: Alert and oriented. No focal neurological deficits. Psych: Judgement and insight appear normal. Mood & affect appropriate.   Data Reviewed: I have personally reviewed following labs and imaging studies  CBC: Recent Labs  Lab  11/17/20 1406 11/18/20 0756  WBC 4.0 3.7*  NEUTROABS 2.7 2.1  HGB 14.2 13.9  HCT 42.3 41.0  MCV 91.8 90.9  PLT 194 161   Basic Metabolic Panel: Recent Labs  Lab 11/17/20 1406 11/18/20 0756  NA 126* 137  K 3.2* 3.4*  CL 88* 102  CO2 28 25  GLUCOSE 112* 102*  BUN 12 12  CREATININE 0.64 0.57  CALCIUM 7.8* 7.4*   GFR: Estimated Creatinine Clearance: 56.6 mL/min (by C-G formula based on SCr of 0.57 mg/dL). Liver Function Tests: Recent Labs  Lab 11/18/20 0756  AST 49*  ALT 34  ALKPHOS 39  BILITOT 0.5  PROT 6.9  ALBUMIN 3.8   No results for input(s): LIPASE, AMYLASE in the last 168 hours. No results for input(s): AMMONIA in the last 168 hours. Coagulation Profile: No results for input(s): INR, PROTIME in the last 168 hours. Cardiac Enzymes: No results for input(s): CKTOTAL, CKMB, CKMBINDEX, TROPONINI in the last 168 hours. BNP (last 3 results) No results for input(s): PROBNP in the last 8760 hours. HbA1C: No results for input(s): HGBA1C in the last 72 hours. CBG: Recent Labs  Lab 11/17/20 1421 11/17/20 1614  GLUCAP 105* 94   Lipid Profile: Recent Labs    11/17/20 1406  TRIG 72   Thyroid Function Tests: Recent Labs    11/17/20 1406  TSH 0.791  FREET4 0.93   Anemia Panel: Recent Labs    11/17/20 1406 11/18/20 0756  FERRITIN 342* 386*   Urine analysis:    Component Value Date/Time   COLORURINE YELLOW 11/17/2020 [place] 11/17/2020 1725   LABSPEC <1.005 (L) 11/17/2020 1725   PHURINE 7.0 11/17/2020 1725   GLUCOSEU NEGATIVE 11/17/2020 1725   HGBUR NEGATIVE 11/17/2020 1725   BILIRUBINUR NEGATIVE 11/17/2020 1725   BILIRUBINUR small (A) 02/18/2017 1524   KETONESUR 40 (A) 11/17/2020 1725   PROTEINUR NEGATIVE 11/17/2020 1725   UROBILINOGEN 0.2 02/18/2017 1524   NITRITE NEGATIVE 11/17/2020 1725   LEUKOCYTESUR MODERATE (A) 11/17/2020 1725   Recent Results (from the past 240 hour(s))  Resp Panel by RT-PCR (Flu A&B, Covid)  Nasopharyngeal Swab     Status: Abnormal   Collection Time: 11/17/20  2:19 PM   Specimen: Nasopharyngeal Swab; Nasopharyngeal(NP) swabs in vial transport medium  Result Value Ref Range Status   SARS Coronavirus 2 by RT PCR POSITIVE (A) NEGATIVE Final    Comment: CRITICAL RESULT CALLED TO, READ BACK BY AND VERIFIED WITH: Sharyon Cable 1521 11/17/2020 COLEMAN,R (NOTE) SARS-CoV-2 target nucleic acids are DETECTED.  The SARS-CoV-2 RNA is generally detectable in upper respiratory specimens during the acute phase of infection. Positive results are indicative of the presence of the identified virus, but do not rule out bacterial infection or co-infection with other pathogens not detected by the test. Clinical correlation with patient history and other diagnostic information is necessary to determine patient infection status. The expected result is Negative.  Fact Sheet for Patients: EntrepreneurPulse.com.au  Fact Sheet for Healthcare Providers: IncredibleEmployment.be  This test is not yet approved or cleared by the [place] and  has been authorized for detection and/or diagnosis of SARS-CoV-2 by FDA under an Emergency Use Authorization (EUA).  This EUA will remain in effect (meaning this t est can be used) for the duration of  the COVID-19 declaration under Section 564(b)(1) of the Act, 21 U.S.C. section 360bbb-3(b)(1), unless the authorization is terminated or revoked sooner.     Influenza A by PCR NEGATIVE NEGATIVE Final   Influenza B by PCR NEGATIVE NEGATIVE Final    Comment: (NOTE) The Xpert Xpress SARS-CoV-2/FLU/RSV plus assay is intended as an aid in the diagnosis of influenza from Nasopharyngeal swab specimens and should not be used as a sole basis for treatment. Nasal washings and aspirates are unacceptable for Xpert Xpress SARS-CoV-2/FLU/RSV testing.  Fact Sheet for Patients: EntrepreneurPulse.com.au  Fact Sheet  for Healthcare Providers: IncredibleEmployment.be  This test is not yet approved or cleared by the [place] FDA and has been authorized for detection and/or diagnosis of SARS-CoV-2 by FDA under an Emergency Use Authorization (EUA). This EUA will remain in effect (meaning this test can be used) for the duration of the COVID-19 declaration under Section 564(b)(1) of the Act, 21 U.S.C. section 360bbb-3(b)(1), unless the authorization is terminated or revoked.  Performed at [facility], [address] [place], [place] [address]   Blood Culture (routine x 2)     Status: None (Preliminary result)   Collection Time: 11/17/20  3:58 PM   Specimen: Left Antecubital; Blood  Result Value Ref Range Status   Specimen Description   Final    LEFT ANTECUBITAL BOTTLES DRAWN AEROBIC AND ANAEROBIC   Special Requests Blood Culture adequate volume  Final   Culture   Final    NO GROWTH < 24 HOURS Performed at [facility], [address] [place], [place] [address]    Report Status PENDING  Incomplete  Blood Culture (routine x 2)     Status: None (Preliminary result)   Collection Time: 11/17/20  3:58 PM   Specimen: BLOOD LEFT WRIST  Result Value Ref Range Status   Specimen Description   Final    BLOOD LEFT WRIST BOTTLES DRAWN AEROBIC AND ANAEROBIC   Special Requests Blood Culture adequate volume  Final   Culture   Final    NO GROWTH < 24 HOURS Performed at [facility], [address] [place],  [address]    Report Status PENDING  Incomplete      Radiology Studies: DG Chest Port 1 View  Result Date: 11/17/2020 CLINICAL DATA:  Weakness, syncope EXAM: PORTABLE CHEST 1 VIEW COMPARISON:  02/12/2018 FINDINGS: Single frontal view of the chest demonstrates a stable cardiac silhouette. Chronic background scarring without airspace disease, effusion, or pneumothorax. No acute bony abnormalities. IMPRESSION: 1. Stable emphysema.  No acute process. Electronically Signed   By:  Randa Ngo M.D.   On: 11/17/2020 15:04    Scheduled Meds:  amLODipine  5 mg Oral Daily   aspirin EC  81 mg Oral Daily   dexamethasone  6 mg Oral Daily   enoxaparin (LOVENOX) injection  40 mg Subcutaneous Q24H   melatonin  5 mg Oral QHS   rosuvastatin  10 mg Oral Daily   Continuous Infusions:  sodium chloride 125 mL/hr at 11/18/20 8144   remdesivir 100 mg in NS 100 mL 100 mg (11/18/20 1126)     LOS: 1 day   Time spent: 25 minutes.  Patrecia Pour, MD [facility]  [email or web address] 11/18/2020, 2:08 PM

## 2020-11-19 LAB — COMPREHENSIVE METABOLIC PANEL
ALT: 34 U/L (ref 0–44)
AST: 52 U/L — ABNORMAL HIGH (ref 15–41)
Albumin: 3.9 g/dL (ref 3.5–5.0)
Alkaline Phosphatase: 41 U/L (ref 38–126)
Anion gap: 8 (ref 5–15)
BUN: 15 mg/dL (ref 8–23)
CO2: 25 mmol/L (ref 22–32)
Calcium: 8.2 mg/dL — ABNORMAL LOW (ref 8.9–10.3)
Chloride: 103 mmol/L (ref 98–111)
Creatinine, Ser: 0.39 mg/dL — ABNORMAL LOW (ref 0.44–1.00)
GFR, Estimated: >60 mL/min (ref >60)
Glucose, Bld: 114 mg/dL — ABNORMAL HIGH (ref 70–99)
Potassium: 3.5 mmol/L (ref 3.5–5.1)
Sodium: 136 mmol/L (ref 135–145)
Total Bilirubin: 0.6 mg/dL (ref 0.3–1.2)
Total Protein: 7.2 g/dL (ref 6.5–8.1)

## 2020-11-19 MED ORDER — AMLODIPINE BESYLATE 5 MG PO TABS
5.0000 mg | ORAL_TABLET | Freq: Once | ORAL | Status: AC
  Administered 2020-11-19: 5 mg via ORAL
  Filled 2020-11-19: qty 1

## 2020-11-19 MED ORDER — AMLODIPINE BESYLATE 10 MG PO TABS
10.0000 mg | ORAL_TABLET | Freq: Every day | ORAL | Status: DC
  Administered 2020-11-20: 10 mg via ORAL
  Filled 2020-11-19: qty 1

## 2020-11-19 NOTE — Progress Notes (Signed)
PROGRESS NOTE  Beth [name]  FGH:[id number] DOB: 13-Oct-1950 DOA: 11/17/2020 PCP: Isaac Bliss, Rayford Halsted, MD   Brief Narrative: Beth [name] is a 70 y.o. female with a history of COPD on nocturnal 2L O2, HTN who presented to the ED 9/11 with several episodes of weakness leading to falls, reported passing out, in the setting of nausea, vomiting, poor per oral intake. In the ED SpO2 was 88% on room air with exertion, covid-19 PCR was positive, though CXR demonstrated no acute infiltrates on emphysematous background. She was started on remdesivir and decadron, admitted.   Assessment & Plan: Principal Problem:   COVID-19 virus infection Active Problems:   Chronic obstructive pulmonary disease ([place])   Essential hypertension   Acute on chronic respiratory failure with hypoxia (HCC)   Hyponatremia  Acute on chronic hypoxemic respiratory failure due to covid-19 infection on COPD: SARS-CoV-2 PCR positive on 9/11, unvaccinated. CXR without pneumonia. CRP reassuring at 0.6, DD at age-adjusted normal.  - Continue remdesivir. Without resolution of diurnal and exertional hypoxia (presumed to be new to patient, though without dyspnea and not checking at baseline, possibly has been going on for a while), inclined to continue inpatient management for at least another day in attempt to liberate from supplemental oxygen. With shared decision making with the patient this morning, we feel that were she to continue needing exertional supplementation on 9/14, she may have maximized benefit of hospitalization and would anticipate discharging home to continue what will be a protracted recovery. She remains at high risk of complications with premorbid hypoxemia at baseline.  - Continue steroids with decadron due to hypoxemia, without concern for COPD exacerbation at this time. - Encourage OOB with assistance, IS - Continue airborne, contact precautions for 10 days from positive testing. - Monitor CMP  and inflammatory markers - Enoxaparin prophylactic dose.  - Inhaled Tx's to continue.   Syncope: Telemetry (personal review) shows only NSR throughout her stay here thus far. To facilitate mobility, will DC cardiac monitoring. Symptoms and recent history of poor oral intake with vomiting suggest orthostasis is primary etiology.   - If recurrent syncopal episode, would recommend outpatient cardiac monitoring.   Hyponatremia: Hypovolemic, resolved while holding thiazide and giving IVF.  - Plan to restart thiazide at DC with elevated BP, and follow up BMP per PCP.   Hypokalemia:  - Resolved.  AST elevation: Likely due to covid.  - Stable, suggest recheck at follow up.  HTN:  - Increase norvasc dose while here (possibly steroid-exacerbated HTN), hold thiazide for now.  HLD:  - Continue statin  DVT prophylaxis: Lovenox Code Status: Full Family Communication: None at bedside Disposition Plan:  Status is: Inpatient  Remains inpatient appropriate because:Inpatient level of care appropriate due to severity of illness  Dispo: The patient is from: Home              Anticipated d/c is to: Home              Patient currently is not medically stable to d/c.   Difficult to place patient No  Consultants:  None  Procedures:  None  Antimicrobials: Remdesivir   Subjective: When getting up and walking, SpO2 drops into 80%'s as low as 80% and takes a while to come back to 90%. Dyspnea is mild at that time (?if chronically exertionally hypoxic and only now checking). She has no chest pain or palpitations, no lightheadedness, fainting or weakness.   Objective: Vitals:   11/18/20 2010 11/18/20 2059 11/19/20 0450  11/19/20 0800  BP:  (!) 153/71 (!) 166/82 (!) 166/80  Pulse:  66 72 86  Resp:   16 12  Temp:  98.1 F (36.7 C) 98.2 F (36.8 C) 97.9 F (36.6 C)  TempSrc:  Oral Oral Oral  SpO2: 97%   92%  Weight:      Height:        Intake/Output Summary (Last 24 hours) at 11/19/2020  1020 Last data filed at 11/19/2020 0900 Gross per 24 hour  Intake 607.97 ml  Output --  Net 607.97 ml   Filed Weights   11/17/20 1330  Weight: 65.8 kg   Gen: 70 y.o. female in no distress Pulm: Nonlabored breathing room air. Diminished without wheezes or crackles. CV: Regular rate and rhythm. No murmur, rub, or gallop. No JVD, no dependent edema. GI: Abdomen soft, non-tender, non-distended, with normoactive bowel sounds.  Ext: Warm, no deformities Skin: No rashes, lesions or ulcers on visualized skin. Neuro: Alert and oriented. No focal neurological deficits. Psych: Judgement and insight appear fair. Mood euthymic & affect congruent. Behavior is appropriate.     Data Reviewed: I have personally reviewed following labs and imaging studies  CBC: Recent Labs  Lab 11/17/20 1406 11/18/20 0756  WBC 4.0 3.7*  NEUTROABS 2.7 2.1  HGB 14.2 13.9  HCT 42.3 41.0  MCV 91.8 90.9  PLT 194 481   Basic Metabolic Panel: Recent Labs  Lab 11/17/20 1406 11/18/20 0756 11/19/20 0321  NA 126* 137 136  K 3.2* 3.4* 3.5  CL 88* 102 103  CO2 28 25 25   GLUCOSE 112* 102* 114*  BUN 12 12 15   CREATININE 0.64 0.57 0.39*  CALCIUM 7.8* 7.4* 8.2*   GFR: Estimated Creatinine Clearance: 56.6 mL/min (A) (by C-G formula based on SCr of 0.39 mg/dL (L)). Liver Function Tests: Recent Labs  Lab 11/18/20 0756 11/19/20 0321  AST 49* 52*  ALT 34 34  ALKPHOS 39 41  BILITOT 0.5 0.6  PROT 6.9 7.2  ALBUMIN 3.8 3.9   No results for input(s): LIPASE, AMYLASE in the last 168 hours. No results for input(s): AMMONIA in the last 168 hours. Coagulation Profile: No results for input(s): INR, PROTIME in the last 168 hours. Cardiac Enzymes: No results for input(s): CKTOTAL, CKMB, CKMBINDEX, TROPONINI in the last 168 hours. BNP (last 3 results) No results for input(s): PROBNP in the last 8760 hours. HbA1C: No results for input(s): HGBA1C in the last 72 hours. CBG: Recent Labs  Lab 11/17/20 1421  11/17/20 1614  GLUCAP 105* 94   Lipid Profile: Recent Labs    11/17/20 1406  TRIG 72   Thyroid Function Tests: Recent Labs    11/17/20 1406  TSH 0.791  FREET4 0.93   Anemia Panel: Recent Labs    11/17/20 1406 11/18/20 0756  FERRITIN 342* 386*   Urine analysis:    Component Value Date/Time   COLORURINE YELLOW 11/17/2020 [place] 11/17/2020 1725   LABSPEC <1.005 (L) 11/17/2020 1725   PHURINE 7.0 11/17/2020 1725   GLUCOSEU NEGATIVE 11/17/2020 1725   HGBUR NEGATIVE 11/17/2020 1725   BILIRUBINUR NEGATIVE 11/17/2020 1725   BILIRUBINUR small (A) 02/18/2017 1524   KETONESUR 40 (A) 11/17/2020 1725   PROTEINUR NEGATIVE 11/17/2020 1725   UROBILINOGEN 0.2 02/18/2017 1524   NITRITE NEGATIVE 11/17/2020 1725   LEUKOCYTESUR MODERATE (A) 11/17/2020 1725   Recent Results (from the past 240 hour(s))  Resp Panel by RT-PCR (Flu A&B, Covid) Nasopharyngeal Swab     Status: Abnormal  Collection Time: 11/17/20  2:19 PM   Specimen: Nasopharyngeal Swab; Nasopharyngeal(NP) swabs in vial transport medium  Result Value Ref Range Status   SARS Coronavirus 2 by RT PCR POSITIVE (A) NEGATIVE Final    Comment: CRITICAL RESULT CALLED TO, READ BACK BY AND VERIFIED WITH: Sharyon Cable 1521 11/17/2020 COLEMAN,R (NOTE) SARS-CoV-2 target nucleic acids are DETECTED.  The SARS-CoV-2 RNA is generally detectable in upper respiratory specimens during the acute phase of infection. Positive results are indicative of the presence of the identified virus, but do not rule out bacterial infection or co-infection with other pathogens not detected by the test. Clinical correlation with patient history and other diagnostic information is necessary to determine patient infection status. The expected result is Negative.  Fact Sheet for Patients: EntrepreneurPulse.com.au  Fact Sheet for Healthcare Providers: IncredibleEmployment.be  This test is not yet  approved or cleared by the [place] FDA and  has been authorized for detection and/or diagnosis of SARS-CoV-2 by FDA under an Emergency Use Authorization (EUA).  This EUA will remain in effect (meaning this t est can be used) for the duration of  the COVID-19 declaration under Section 564(b)(1) of the Act, 21 U.S.C. section 360bbb-3(b)(1), unless the authorization is terminated or revoked sooner.     Influenza A by PCR NEGATIVE NEGATIVE Final   Influenza B by PCR NEGATIVE NEGATIVE Final    Comment: (NOTE) The Xpert Xpress SARS-CoV-2/FLU/RSV plus assay is intended as an aid in the diagnosis of influenza from Nasopharyngeal swab specimens and should not be used as a sole basis for treatment. Nasal washings and aspirates are unacceptable for Xpert Xpress SARS-CoV-2/FLU/RSV testing.  Fact Sheet for Patients: EntrepreneurPulse.com.au  Fact Sheet for Healthcare Providers: IncredibleEmployment.be  This test is not yet approved or cleared by the [place] FDA and has been authorized for detection and/or diagnosis of SARS-CoV-2 by FDA under an Emergency Use Authorization (EUA). This EUA will remain in effect (meaning this test can be used) for the duration of the COVID-19 declaration under Section 564(b)(1) of the Act, 21 U.S.C. section 360bbb-3(b)(1), unless the authorization is terminated or revoked.  Performed at [facility], [address], [place], [place] [address]   Blood Culture (routine x 2)     Status: None (Preliminary result)   Collection Time: 11/17/20  3:58 PM   Specimen: Left Antecubital; Blood  Result Value Ref Range Status   Specimen Description   Final    LEFT ANTECUBITAL BOTTLES DRAWN AEROBIC AND ANAEROBIC   Special Requests Blood Culture adequate volume  Final   Culture   Final    NO GROWTH 2 DAYS Performed at [facility], [address], [place], [place] [address]    Report Status PENDING  Incomplete  Blood Culture  (routine x 2)     Status: None (Preliminary result)   Collection Time: 11/17/20  3:58 PM   Specimen: BLOOD LEFT WRIST  Result Value Ref Range Status   Specimen Description   Final    BLOOD LEFT WRIST BOTTLES DRAWN AEROBIC AND ANAEROBIC   Special Requests Blood Culture adequate volume  Final   Culture   Final    NO GROWTH 2 DAYS Performed at [facility], [address] [place], [place] [address]    Report Status PENDING  Incomplete      Radiology Studies: DG Chest Port 1 View  Result Date: 11/17/2020 CLINICAL DATA:  Weakness, syncope EXAM: PORTABLE CHEST 1 VIEW COMPARISON:  02/12/2018 FINDINGS: Single frontal view of the chest demonstrates a  stable cardiac silhouette. Chronic background scarring without airspace disease, effusion, or pneumothorax. No acute bony abnormalities. IMPRESSION: 1. Stable emphysema.  No acute process. Electronically Signed   By: Randa Ngo M.D.   On: 11/17/2020 15:04    Scheduled Meds:  amLODipine  5 mg Oral Daily   aspirin EC  81 mg Oral Daily   dexamethasone  6 mg Oral Daily   enoxaparin (LOVENOX) injection  40 mg Subcutaneous Daily   melatonin  5 mg Oral QHS   rosuvastatin  10 mg Oral Daily   Continuous Infusions:  remdesivir 100 mg in NS 100 mL 100 mg (11/18/20 1126)     LOS: 2 days   Time spent: 25 minutes.  Patrecia Pour, MD [facility] [email or web address] 11/19/2020, 10:20 AM

## 2020-11-19 NOTE — Progress Notes (Signed)
   11/19/20 0450  Vitals  Temp 98.2 F (36.8 C)  Temp Source Oral  BP (!) 166/82  MAP (mmHg) 107  BP Location Right Arm  BP Method Automatic  Patient Position (if appropriate) Sitting  Pulse Rate 72  Pulse Rate Source Dinamap  Resp 16  MEWS COLOR  MEWS Score Color Green  MEWS Score  MEWS Temp 0  MEWS Systolic 0  MEWS Pulse 0  MEWS RR 0  MEWS LOC 0  MEWS Score 0  Provider paged and made aware via secure chat.

## 2020-11-20 MED ORDER — PREDNISONE 20 MG PO TABS: 40.0000 mg | ORAL_TABLET | Freq: Every day | ORAL | 0 refills | Status: AC

## 2020-11-20 NOTE — Discharge Summary (Signed)
Physician Discharge Summary  [name] NTI:[id number] DOB: [birth year] DOA: 11/17/2020  PCP: Beth [name]  Admit date: 11/17/2020 Discharge date: 11/20/2020  Admitted From: Home Disposition: Home   Recommendations for Outpatient Follow-up:  Follow up with PCP in 1-2 weeks Consider repeat CMP at follow up Routine pulmonary follow up with Beth [name].  Home Health: None Equipment/Devices: Continue 2L O2 at night only. No longer requiring daytime oxygen at time of discharge. Discharge Condition: Stable CODE STATUS: Full Diet recommendation: Heart healthy  Brief/Interim Summary: Beth [name] is a 70 y.o. female with a history of COPD on nocturnal 2L O2, HTN who presented to the ED 9/11 with several episodes of weakness leading to falls, reported passing out, in the setting of nausea, vomiting, poor per oral intake. In the ED SpO2 was 88% on room air with exertion, covid-19 PCR was positive, though CXR demonstrated no acute infiltrates on emphysematous background. She was started on remdesivir and decadron, and admitted. Inflammatory markers were reassuring. With continued treatment, exertional hypoxia has resolved as of 9/14, so the patient will be discharged with plans to complete a steroid burst and continue on home inhaled therapies and nocturnal oxygen only.  Discharge Diagnoses:  Principal Problem:   COVID-19 virus infection Active Problems:   Chronic obstructive pulmonary disease ([place])   Essential hypertension   Acute on chronic respiratory failure with hypoxia (HCC)   Hyponatremia  Acute on chronic hypoxemic respiratory failure due to covid-19 infection on COPD: SARS-CoV-2 PCR positive on 9/11, unvaccinated. CXR without pneumonia. CRP reassuring at 0.6, DD at age-adjusted normal.  - Received remdesivir 9/11 - 9/14 and decadron 6mg  daily during that time. Will complete 7 total days of steroid with prednisone 40mg  at discharge x 3 days.  - Ambulated  with RN this AM, SpO2 nadir was 91% on room air without significant dyspnea. This is a significant improvement. She will not require exertional/daytime oxygen supplementation at discharge.  - Encourage continued use of IS - Continue airborne, contact precautions for 10 days from positive testing.   Syncope: Telemetry showed no significant dysrhythmias and she had no further episodes. Symptoms and recent history of poor oral intake with vomiting suggest orthostasis is primary etiology.   - If recurrent syncopal episode, would recommend outpatient cardiac monitoring.    Hyponatremia: Hypovolemic, resolved while holding thiazide and giving IVF.  - Plan to restart thiazide at DC with elevated BP, and follow up BMP per PCP.    Hypokalemia:  - Resolved.   AST elevation: Likely due to covid.  - Stable, suggest recheck at follow up.   HTN:  - Continue home medications at DC.   HLD:  - Continue statin  Discharge Instructions Discharge Instructions     Discharge instructions   Complete by: As directed    You were evaluated for covid-19 infection which has improved with antiviral treatment (remdesivir) and steroids. You are stable for discharge with the following recommendations:  - Continue taking prednisone 40mg  in the morning for 3 more days (first dose is tomorrow) - Continue taking other medications as you were prior to admission - Remain in isolation for 10 days from the date of positive covid testing - Follow up with your PCP after this isolation period, or seek medical attention right away if your symptoms worsen, you develop chest pain, leg swelling, worsening cough or shortness of breath.  Thank you for letting me take care of you here. It was a pleasure. - Beth [name].  Allergies as of 11/20/2020       Reactions   Cefzil [cefprozil] Other (See Comments)   Burning sensation in lower legs.    Zanaflex [tizanidine Hcl] Nausea And Vomiting        Medication List      TAKE these medications    acetaminophen 500 MG tablet Commonly known as: TYLENOL Take 500 mg by mouth every 6 (six) hours as needed for mild pain or moderate pain.   albuterol 108 (90 Base) MCG/ACT inhaler Commonly known as: VENTOLIN HFA Inhale 2 puffs into the lungs every 4 (four) hours as needed for wheezing or shortness of breath (cough, shortness of breath or wheezing.).   alendronate 70 MG tablet Commonly known as: FOSAMAX Take 1 tablet (70 mg total) by mouth once a week.   aspirin EC 81 MG tablet Take 1 tablet (81 mg total) by mouth daily.   hydrochlorothiazide 25 MG tablet Commonly known as: HYDRODIURIL Take 1 tablet (25 mg total) by mouth daily.   naproxen sodium 220 MG tablet Commonly known as: ALEVE Take 220 mg by mouth 2 (two) times daily as needed (for pain).   predniSONE 20 MG tablet Commonly known as: DELTASONE Take 2 tablets (40 mg total) by mouth daily for 3 days. Start taking on: November 21, 2020   rosuvastatin 10 MG tablet Commonly known as: Crestor Take 1 tablet (10 mg total) by mouth daily.   Trelegy Ellipta 100-62.5-25 MCG/INH Aepb Generic drug: Fluticasone-Umeclidin-Vilant Inhale 1 puff into the lungs daily.        Follow-up Information     Beth [name]. Schedule an appointment as soon as possible for a visit in 2 week(s).   Specialty: Internal Medicine Contact information: [place] [address] [phone]         Beth [name] Follow up.   Specialty: Pulmonary Disease Contact information: [address] [place] [address] [phone]                Allergies  Allergen Reactions   Cefzil [Cefprozil] Other (See Comments)    Burning sensation in lower legs.    Zanaflex [Tizanidine Hcl] Nausea And Vomiting    Consultations: None  Procedures/Studies: DG Chest Port 1 View  Result Date: 11/17/2020 CLINICAL DATA:  Weakness, syncope EXAM: PORTABLE CHEST 1  VIEW COMPARISON:  02/12/2018 FINDINGS: Single frontal view of the chest demonstrates a stable cardiac silhouette. Chronic background scarring without airspace disease, effusion, or pneumothorax. No acute bony abnormalities. IMPRESSION: 1. Stable emphysema.  No acute process. Electronically Signed   By: Randa Ngo M.D.   On: 11/17/2020 15:04      Subjective: Feels well, slept better than she has in weeks. Breathing is at baseline and didn't have significant dyspnea with ambulation with RN this morning. No hypoxia either. No chest pain or other new symptoms. Ready to go home.   Discharge Exam: Vitals:   11/19/20 2059 11/20/20 0446  BP: (!) 163/86 (!) 163/84  Pulse: 72 61  Resp: 17 16  Temp: 98.3 F (36.8 C) 98.2 F (36.8 C)  SpO2: 98% 98%   General: Pt is alert, awake, not in acute distress Cardiovascular: RRR, S1/S2 +, no rubs, no gallops Respiratory: Nonlabored and remains clear, diminished.  Labs: Basic Metabolic Panel: Recent Labs  Lab 11/17/20 1406 11/18/20 0756 11/19/20 0321  NA 126* 137 136  K 3.2* 3.4* 3.5  CL 88* 102 103  CO2 28 25 25  GLUCOSE 112* 102* 114*  BUN 12 12 15   CREATININE 0.64 0.57 0.39*  CALCIUM 7.8* 7.4* 8.2*   Liver Function Tests: Recent Labs  Lab 11/18/20 0756 11/19/20 0321  AST 49* 52*  ALT 34 34  ALKPHOS 39 41  BILITOT 0.5 0.6  PROT 6.9 7.2  ALBUMIN 3.8 3.9   CBC: Recent Labs  Lab 11/17/20 1406 11/18/20 0756  WBC 4.0 3.7*  NEUTROABS 2.7 2.1  HGB 14.2 13.9  HCT 42.3 41.0  MCV 91.8 90.9  PLT 194 200   Thyroid function studies Recent Labs    11/17/20 1406  TSH 0.791   Anemia work up Recent Labs    11/17/20 1406 11/18/20 0756  FERRITIN 342* 386*   Microbiology Recent Results (from the past 240 hour(s))  Resp Panel by RT-PCR (Flu A&B, Covid) Nasopharyngeal Swab     Status: Abnormal   Collection Time: 11/17/20  2:19 PM   SARS Coronavirus 2 by RT PCR POSITIVE (A) NEGATIVE Final   Influenza A by PCR NEGATIVE  NEGATIVE Final   Influenza B by PCR NEGATIVE NEGATIVE Final  Blood Culture (routine x 2)     Status: None (Preliminary result)   Collection Time: 11/17/20  3:58 PM    NO GROWTH 3 DAYS Performed at [facility], [address], [place], [place] [address]   Blood Culture (routine x 2)     Status: None (Preliminary result)   Collection Time: 11/17/20  3:58 PM    NO GROWTH 3 DAYS Performed at [facility], [address] [place], [place] [address]    Time coordinating discharge: Approximately 40 minutes  Patrecia Pour, [name]  [facility] 11/20/2020, 10:49 AM

## 2020-11-22 ENCOUNTER — Encounter: Payer: Medicare HMO | Admitting: Internal Medicine

## 2020-11-23 LAB — CULTURE, BLOOD (ROUTINE X 2)
Culture: NO GROWTH
Culture: NO GROWTH
Special Requests: ADEQUATE
Special Requests: ADEQUATE

## 2020-12-05 DIAGNOSIS — J449 Chronic obstructive pulmonary disease, unspecified: Secondary | ICD-10-CM | POA: Diagnosis not present

## 2020-12-05 DIAGNOSIS — J9611 Chronic respiratory failure with hypoxia: Secondary | ICD-10-CM | POA: Diagnosis not present

## 2020-12-05 DIAGNOSIS — J432 Centrilobular emphysema: Secondary | ICD-10-CM | POA: Diagnosis not present

## 2020-12-24 DIAGNOSIS — C44311 Basal cell carcinoma of skin of nose: Secondary | ICD-10-CM | POA: Diagnosis not present

## 2021-01-04 DIAGNOSIS — J432 Centrilobular emphysema: Secondary | ICD-10-CM | POA: Diagnosis not present

## 2021-01-04 DIAGNOSIS — J9611 Chronic respiratory failure with hypoxia: Secondary | ICD-10-CM | POA: Diagnosis not present

## 2021-01-04 DIAGNOSIS — J449 Chronic obstructive pulmonary disease, unspecified: Secondary | ICD-10-CM | POA: Diagnosis not present

## 2021-01-14 DIAGNOSIS — Z48817 Encounter for surgical aftercare following surgery on the skin and subcutaneous tissue: Secondary | ICD-10-CM | POA: Diagnosis not present

## 2021-01-22 DIAGNOSIS — Z85828 Personal history of other malignant neoplasm of skin: Secondary | ICD-10-CM | POA: Diagnosis not present

## 2021-01-22 DIAGNOSIS — R202 Paresthesia of skin: Secondary | ICD-10-CM | POA: Diagnosis not present

## 2021-01-22 DIAGNOSIS — Z08 Encounter for follow-up examination after completed treatment for malignant neoplasm: Secondary | ICD-10-CM | POA: Diagnosis not present

## 2021-01-22 DIAGNOSIS — L570 Actinic keratosis: Secondary | ICD-10-CM | POA: Diagnosis not present

## 2021-02-19 ENCOUNTER — Ambulatory Visit (INDEPENDENT_AMBULATORY_CARE_PROVIDER_SITE_OTHER): Payer: Medicare HMO | Admitting: Internal Medicine

## 2021-02-19 ENCOUNTER — Encounter: Payer: Self-pay | Admitting: Internal Medicine

## 2021-02-19 VITALS — BP 138/82 | HR 102 | Temp 97.8°F | Ht 62.0 in | Wt 134.6 lb

## 2021-02-19 DIAGNOSIS — B351 Tinea unguium: Secondary | ICD-10-CM | POA: Diagnosis not present

## 2021-02-19 DIAGNOSIS — J9611 Chronic respiratory failure with hypoxia: Secondary | ICD-10-CM

## 2021-02-19 DIAGNOSIS — Z0000 Encounter for general adult medical examination without abnormal findings: Secondary | ICD-10-CM

## 2021-02-19 DIAGNOSIS — I700 Atherosclerosis of aorta: Secondary | ICD-10-CM

## 2021-02-19 DIAGNOSIS — R030 Elevated blood-pressure reading, without diagnosis of hypertension: Secondary | ICD-10-CM

## 2021-02-19 DIAGNOSIS — J439 Emphysema, unspecified: Secondary | ICD-10-CM

## 2021-02-19 DIAGNOSIS — M8000XD Age-related osteoporosis with current pathological fracture, unspecified site, subsequent encounter for fracture with routine healing: Secondary | ICD-10-CM | POA: Diagnosis not present

## 2021-02-19 DIAGNOSIS — I10 Essential (primary) hypertension: Secondary | ICD-10-CM | POA: Diagnosis not present

## 2021-02-19 LAB — CBC WITH DIFFERENTIAL/PLATELET
Basophils Absolute: 0 10*3/uL (ref 0.0–0.1)
Basophils Relative: 0.5 % (ref 0.0–3.0)
Eosinophils Absolute: 0.1 10*3/uL (ref 0.0–0.7)
Eosinophils Relative: 0.7 % (ref 0.0–5.0)
HCT: 43.6 % (ref 36.0–46.0)
Hemoglobin: 14.7 g/dL (ref 12.0–15.0)
Lymphocytes Relative: 11.5 % — ABNORMAL LOW (ref 12.0–46.0)
Lymphs Abs: 1.1 10*3/uL (ref 0.7–4.0)
MCHC: 33.7 g/dL (ref 30.0–36.0)
MCV: 90.7 fl (ref 78.0–100.0)
Monocytes Absolute: 0.9 10*3/uL (ref 0.1–1.0)
Monocytes Relative: 9.3 % (ref 3.0–12.0)
Neutro Abs: 7.3 10*3/uL (ref 1.4–7.7)
Neutrophils Relative %: 78 % — ABNORMAL HIGH (ref 43.0–77.0)
Platelets: 272 10*3/uL (ref 150.0–400.0)
RBC: 4.81 Mil/uL (ref 3.87–5.11)
RDW: 13.5 % (ref 11.5–15.5)
WBC: 9.4 10*3/uL (ref 4.0–10.5)

## 2021-02-19 LAB — COMPREHENSIVE METABOLIC PANEL
ALT: 17 U/L (ref 0–35)
AST: 22 U/L (ref 0–37)
Albumin: 4.4 g/dL (ref 3.5–5.2)
Alkaline Phosphatase: 58 U/L (ref 39–117)
BUN: 19 mg/dL (ref 6–23)
CO2: 31 mEq/L (ref 19–32)
Calcium: 10 mg/dL (ref 8.4–10.5)
Chloride: 98 mEq/L (ref 96–112)
Creatinine, Ser: 0.71 mg/dL (ref 0.40–1.20)
GFR: 84.76 mL/min (ref >60.00)
Glucose, Bld: 119 mg/dL — ABNORMAL HIGH (ref 70–99)
Potassium: 3.9 mEq/L (ref 3.5–5.1)
Sodium: 139 mEq/L (ref 135–145)
Total Bilirubin: 0.4 mg/dL (ref 0.2–1.2)
Total Protein: 8.2 g/dL (ref 6.0–8.3)

## 2021-02-19 LAB — VITAMIN B12: Vitamin B-12: 340 pg/mL (ref 211–911)

## 2021-02-19 LAB — TSH: TSH: 0.88 u[IU]/mL (ref 0.35–5.50)

## 2021-02-19 LAB — LIPID PANEL
Cholesterol: 111 mg/dL (ref 0–200)
HDL: 62.5 mg/dL (ref >39.00)
LDL Cholesterol: 36 mg/dL (ref 0–99)
NonHDL: 48
Total CHOL/HDL Ratio: 2
Triglycerides: 61 mg/dL (ref 0.0–149.0)
VLDL: 12.2 mg/dL (ref 0.0–40.0)

## 2021-02-19 LAB — VITAMIN D 25 HYDROXY (VIT D DEFICIENCY, FRACTURES): VITD: 70.44 ng/mL (ref 30.00–100.00)

## 2021-02-19 LAB — HEMOGLOBIN A1C: Hgb A1c MFr Bld: 6.2 % (ref 4.6–6.5)

## 2021-02-19 NOTE — Patient Instructions (Signed)
-  Nice seeing you today!!  -Lab work today; will notify you once results are available.  -Consider your COVID vaccines at the pharmacy.  -Pap smear next visit.  -Schedule follow up in 6 months.

## 2021-02-19 NOTE — Progress Notes (Signed)
Established Patient Office Visit     This visit occurred during the SARS-CoV-2 public health emergency.  Safety protocols were in place, including screening questions prior to the visit, additional usage of staff PPE, and extensive cleaning of exam room while observing appropriate contact time as indicated for disinfecting solutions.    CC/Reason for Visit: Annual preventive exam and subsequent Medicare wellness visit  HPI: Beth [name] is a 70 y.o. female who is coming in today for the above mentioned reasons. Past Medical History is significant for:  COPD with chronic hypoxemic respiratory failure, hypertension with an element of whitecoat syndrome, hyperlipidemia, osteoporosis on alendronate.  She does annual low-dose CT scans in December for lung cancer screening through her pulmonologist.  She has routine eye care but is overdue for dental care.  She is due for her COVID vaccinations but declines.  No perceived hearing issues.  She is overdue for Pap smear.  She had a DEXA scan in 2021.  She has onychomycosis of her left big toenail and it is difficult for her to cut them using regular nail clippers.   Past Medical/Surgical History: Past Medical History:  Diagnosis Date   Age-related osteoporosis with current pathological fracture 03/11/2017   DEXA bone scan at [facility] 03/29/2017 T score -3.1 at Rt total femur   COPD (chronic obstructive pulmonary disease) (HCC)    Coronary atherosclerosis of native coronary artery 07/22/2017   Seen on chest Ct 07/21/17. Aortic and branch vessel atherosclerosis. Normal heart size, without pericardial effusion. Multivessel coronary artery atherosclerosis.   Hypertension    Non-traumatic compression fracture of vertebral column ([place]) 02/18/2017   Chest CT 07/21/17: Moderate T5 and moderate to severe T6 compression deformities w/o significant canal encroachment. Moderate T8 compression deformity is also w/o canal encroachment   Squamous cell  cancer of external ear, right 03/2017   right, excison by Dr. Benjamine Mola 04/2017    Past Surgical History:  Procedure Laterality Date   EYE SURGERY     MASS EXCISION Right 04/12/2017   Procedure: EXCISION OF RIGHT EAR  MASS;  Surgeon: Leta Baptist, MD;  Location: [facility];  Service: ENT;  Laterality: Right; squamous cell cancer with clear margins   SKIN FULL THICKNESS GRAFT Left 04/12/2017   Procedure: SKIN GRAFT FULL THICKNESS FROM ABDOMEN TO RIGHT EAR;  Surgeon: Leta Baptist, MD;  Location: [facility];  Service: ENT;  Laterality: Right   TONSILLECTOMY      Social History:  reports that she quit smoking about 4 years ago. Her smoking use included cigarettes. She has never used smokeless tobacco. She reports that she does not drink alcohol and does not use drugs.  Allergies: Allergies  Allergen Reactions   Cefzil [Cefprozil] Other (See Comments)    Burning sensation in lower legs.    Zanaflex [Tizanidine Hcl] Nausea And Vomiting    Family History:  Family History  Problem Relation Age of Onset   Heart attack Mother    Stroke Mother    Lung cancer Father    Arthritis Sister      Current Outpatient Medications:    acetaminophen (TYLENOL) 500 MG tablet, Take 500 mg by mouth every 6 (six) hours as needed for mild pain or moderate pain., Disp: , Rfl:    albuterol (VENTOLIN HFA) 108 (90 Base) MCG/ACT inhaler, Inhale 2 puffs into the lungs every 4 (four) hours as needed for wheezing or shortness of breath (cough, shortness of breath or wheezing.)., Disp:  18 g, Rfl: 1   alendronate (FOSAMAX) 70 MG tablet, Take 1 tablet (70 mg total) by mouth once a week., Disp: 12 tablet, Rfl: 3   aspirin EC 81 MG tablet, Take 1 tablet (81 mg total) by mouth daily., Disp: 30 tablet, Rfl: 11   Fluticasone-Umeclidin-Vilant (TRELEGY ELLIPTA) 100-62.5-25 MCG/INH AEPB, Inhale 1 puff into the lungs daily., Disp: 60 each, Rfl: 11   hydrochlorothiazide (HYDRODIURIL) 25 MG tablet, Take 1 tablet  (25 mg total) by mouth daily., Disp: 90 tablet, Rfl: 3   naproxen sodium (ALEVE) 220 MG tablet, Take 220 mg by mouth 2 (two) times daily as needed (for pain)., Disp: , Rfl:    rosuvastatin (CRESTOR) 10 MG tablet, Take 1 tablet (10 mg total) by mouth daily., Disp: 90 tablet, Rfl: 3  Review of Systems:  Constitutional: Denies fever, chills, diaphoresis, appetite change and fatigue.  HEENT: Denies photophobia, eye pain, redness, hearing loss, ear pain, congestion, sore throat, rhinorrhea, sneezing, mouth sores, trouble swallowing, neck pain, neck stiffness and tinnitus.   Respiratory: Denies SOB, DOE, cough, chest tightness,  and wheezing.   Cardiovascular: Denies chest pain, palpitations and leg swelling.  Gastrointestinal: Denies nausea, vomiting, abdominal pain, diarrhea, constipation, blood in stool and abdominal distention.  Genitourinary: Denies dysuria, urgency, frequency, hematuria, flank pain and difficulty urinating.  Endocrine: Denies: hot or cold intolerance, sweats, changes in hair or nails, polyuria, polydipsia. Musculoskeletal: Denies myalgias, back pain, joint swelling, arthralgias and gait problem.  Skin: Denies pallor, rash and wound.  Neurological: Denies dizziness, seizures, syncope, weakness, light-headedness, numbness and headaches.  Hematological: Denies adenopathy. Easy bruising, personal or family bleeding history  Psychiatric/Behavioral: Denies suicidal ideation, mood changes, confusion, nervousness, sleep disturbance and agitation    Physical Exam: Vitals:   02/19/21 0837  BP: 138/82  Pulse: (!) 102  Temp: 97.8 F (36.6 C)  TempSrc: Oral  SpO2: 95%  Weight: 134 lb 9.6 oz (61.1 kg)  Height: 5\' 2"  (1.575 m)    Body mass index is 24.62 kg/m.   Constitutional: NAD, calm, comfortable Eyes: PERRL, lids and conjunctivae normal, wears corrective lenses ENMT: Mucous membranes are moist. Posterior pharynx clear of any exudate or lesions. Normal dentition. Tympanic  membrane is pearly white, no erythema or bulging. Neck: normal, supple, no masses, no thyromegaly Respiratory: clear to auscultation bilaterally, no wheezing, no crackles. Normal respiratory effort. No accessory muscle use.  Cardiovascular: Regular rate and rhythm, no murmurs / rubs / gallops. No extremity edema. 2+ pedal pulses. No carotid bruits.  Abdomen: no tenderness, no masses palpated. No hepatosplenomegaly. Bowel sounds positive.  Musculoskeletal: no clubbing / cyanosis. No joint deformity upper and lower extremities. Good ROM, no contractures. Normal muscle tone.  Skin: no rashes, lesions, ulcers. No induration Neurologic: CN 2-12 grossly intact. Sensation intact, DTR normal. Strength 5/5 in all 4.  Psychiatric: Normal judgment and insight. Alert and oriented x 3. Normal mood.    Subsequent Medicare wellness visit   1. Risk factors, based on past  M,S,F -cardiovascular disease risk factors include age, gender, hypertension, hyperlipidemia, aortic atherosclerosis.   2.  Physical activities: Sedentary other than activities of daily living   3.  Depression/mood: Stable, not depressed   4.  Hearing: No perceived issues   5.  ADL's: Independent in all ADLs   6.  Fall risk: Low fall risk   7.  Home safety: No problems identified   8.  Height weight, and visual acuity: height and weight as above, vision:  Vision Screening  Right eye Left eye Both eyes  Without correction     With correction 20/25 20/50 20/25      9.  Counseling: Advise she update COVID-vaccine   10. Lab orders based on risk factors: Laboratory update will be reviewed   11. Referral : None today   12. Care plan: Follow-up with me in 6 months   13. Cognitive assessment: No cognitive impairment   14. Screening: Patient provided with a written and personalized 5-10 year screening schedule in the AVS. yes   15. Provider List Update: PCP, pulmonologist Dr. Halford Chessman  16. Advance Directives: Full code   17.  Opioids: Patient is not on any opioid prescriptions and has no risk factors for a substance use disorder.   [place] Office Visit from 02/19/2021 in [place] at [facility]  PHQ-9 Total Score 0       Fall Risk 11/18/2020 11/19/2020 11/19/2020 11/20/2020 02/19/2021  Falls in the past year? - - - - 0  Was there an injury with Fall? - - - - -  Fall Risk Category Calculator - - - - -  Fall Risk Category - - - - -  Patient Fall Risk Level Moderate fall risk Moderate fall risk Moderate fall risk Moderate fall risk Low fall risk  Fall risk Follow up - - - - -     Impression and Plan:  Encounter for preventive health examination -Recommend routine eye and dental care. -Immunizations: Declines COVID vaccination despite counseling, all other immunizations are up-to-date -Healthy lifestyle discussed in detail. -Labs to be updated today. -Colon cancer screening: Declines -Breast cancer screening: February/2022 -Cervical cancer screening: Overdue, consider performing next visit -Lung cancer screening: 02/2020, due for repeat -Prostate cancer screening: Not applicable -DEXA: 04/7780  White coat syndrome with high blood pressure but without hypertension -In office blood pressure elevated 138/82, however she brings in her home measurements with systolics in the upper 42P to low 536R and diastolics in the 44R to low 70s.  Age-related osteoporosis with current pathological fracture with routine healing, subsequent encounter -On alendronate, had DEXA scan in August 2021.  Pulmonary emphysema, unspecified emphysema type ([place]) Chronic respiratory failure with hypoxia (HCC) -Followed by pulmonary.  Aortic atherosclerosis (HCC) -Check lipids today, continue rosuvastatin.  Onychomycosis  - Plan: Ambulatory referral to Podiatry    Patient Instructions  -Nice seeing you today!!  -Lab work today; will notify you once results are available.  -Consider your COVID vaccines at the  pharmacy.  -Pap smear next visit.  -Schedule follow up in 6 months.     Lelon Frohlich, MD [facility]

## 2021-02-26 ENCOUNTER — Other Ambulatory Visit: Payer: Self-pay

## 2021-02-26 ENCOUNTER — Ambulatory Visit: Payer: Medicare HMO | Admitting: Podiatry

## 2021-02-26 ENCOUNTER — Encounter: Payer: Self-pay | Admitting: Podiatry

## 2021-02-26 DIAGNOSIS — L603 Nail dystrophy: Secondary | ICD-10-CM | POA: Diagnosis not present

## 2021-02-26 NOTE — Progress Notes (Signed)
°  Subjective:  Patient ID: Beth [name], female    DOB: 03-Mar-1951,   MRN: [id number]  Chief Complaint  Patient presents with   Nail Problem    Nail thickness and discoloration. Left great toe nail only.     70 y.o. female presents for concern of thickened discolored toenails particularly the left great toenails. States it is thick and difficult for her to trim. Denies any injury  . Denies any other pedal complaints. Denies n/v/f/c.   Past Medical History:  Diagnosis Date   Age-related osteoporosis with current pathological fracture 03/11/2017   [facility] at [facility] 03/29/2017 T score -3.1 at Rt total femur   COPD (chronic obstructive pulmonary disease) (HCC)    Coronary atherosclerosis of native coronary artery 07/22/2017   Seen on chest Ct 07/21/17. Aortic and branch vessel atherosclerosis. Normal heart size, without pericardial effusion. Multivessel coronary artery atherosclerosis.   Hypertension    Non-traumatic compression fracture of vertebral column ([place]) 02/18/2017   Chest CT 07/21/17: Moderate T5 and moderate to severe T6 compression deformities w/o significant canal encroachment. Moderate T8 compression deformity is also w/o canal encroachment   Squamous cell cancer of external ear, right 03/2017   right, excison by Dr. Benjamine Mola 04/2017    Objective:  Physical Exam: Vascular: DP/PT pulses 2/4 bilateral. CFT <3 seconds. Normal hair growth on digits. No edema.  Skin. No lacerations or abrasions bilateral feet. Left hallux nail thickened and discolored with subungual debris.  Musculoskeletal: MMT 5/5 bilateral lower extremities in DF, PF, Inversion and Eversion. Deceased ROM in DF of ankle joint.  Neurological: Sensation intact to light touch.   Assessment:   1. Onychodystrophy      Plan:  Patient was evaluated and treated and all questions answered. -Examined patient -Discussed treatment options for painful dystrophic nails  -Suggested urea nail gel vs vicks  vaporub.  -Discussed fungal nail treatment options including oral, topical, and laser treatments. Will try the vicks.  -Patient to return as needed.   Lorenda Peck, DPM

## 2021-03-07 ENCOUNTER — Other Ambulatory Visit: Payer: Self-pay | Admitting: Acute Care

## 2021-03-07 DIAGNOSIS — Z87891 Personal history of nicotine dependence: Secondary | ICD-10-CM

## 2021-03-12 DIAGNOSIS — L814 Other melanin hyperpigmentation: Secondary | ICD-10-CM | POA: Diagnosis not present

## 2021-03-12 DIAGNOSIS — Z85828 Personal history of other malignant neoplasm of skin: Secondary | ICD-10-CM | POA: Diagnosis not present

## 2021-03-12 DIAGNOSIS — L819 Disorder of pigmentation, unspecified: Secondary | ICD-10-CM | POA: Diagnosis not present

## 2021-03-12 DIAGNOSIS — Z08 Encounter for follow-up examination after completed treatment for malignant neoplasm: Secondary | ICD-10-CM | POA: Diagnosis not present

## 2021-03-12 DIAGNOSIS — L570 Actinic keratosis: Secondary | ICD-10-CM | POA: Diagnosis not present

## 2021-03-12 DIAGNOSIS — D492 Neoplasm of unspecified behavior of bone, soft tissue, and skin: Secondary | ICD-10-CM | POA: Diagnosis not present

## 2021-03-18 ENCOUNTER — Other Ambulatory Visit: Payer: Self-pay | Admitting: Internal Medicine

## 2021-03-18 DIAGNOSIS — Z1231 Encounter for screening mammogram for malignant neoplasm of breast: Secondary | ICD-10-CM

## 2021-03-26 ENCOUNTER — Encounter: Payer: Medicare HMO | Admitting: Internal Medicine

## 2021-03-26 ENCOUNTER — Other Ambulatory Visit: Payer: Self-pay

## 2021-03-26 DIAGNOSIS — R030 Elevated blood-pressure reading, without diagnosis of hypertension: Secondary | ICD-10-CM

## 2021-03-26 DIAGNOSIS — I2510 Atherosclerotic heart disease of native coronary artery without angina pectoris: Secondary | ICD-10-CM

## 2021-03-26 DIAGNOSIS — M810 Age-related osteoporosis without current pathological fracture: Secondary | ICD-10-CM

## 2021-03-26 MED ORDER — ALENDRONATE SODIUM 70 MG PO TABS: 70.0000 mg | ORAL_TABLET | ORAL | 1 refills | Status: DC

## 2021-03-26 MED ORDER — ROSUVASTATIN CALCIUM 10 MG PO TABS: 10.0000 mg | ORAL_TABLET | Freq: Every day | ORAL | 1 refills | Status: DC

## 2021-03-26 MED ORDER — HYDROCHLOROTHIAZIDE 25 MG PO TABS: 25.0000 mg | ORAL_TABLET | Freq: Every day | ORAL | 1 refills | Status: DC

## 2021-04-08 ENCOUNTER — Telehealth: Payer: Self-pay | Admitting: Acute Care

## 2021-04-15 ENCOUNTER — Encounter: Payer: Self-pay | Admitting: Pulmonary Disease

## 2021-04-15 NOTE — Telephone Encounter (Signed)
Hello Dr. Halford Chessman, my name is Beth [name], November 18, 1950, I   am a patient of yours.   In 2019 I signed up with [employer] Gess to take part in a seven year no cost to me program to have 1 chest CT scan a year. I have never been charged for any of the 4 scans that I have already done.  I have an appointment on February 15 at [facility] and was messaged on My Chart that I will have to pay $106.17 upon check in. I've made several calls to different numbers but can not get an answer as to why. If I am to be charged I would like to cancel my appointment ahead of time and drop out of this No Cost To Me program. If you can find anything out please let me know.  I spoke with Dawn in Radiology at [facility] and she stated that there was a note of authorization for no charge for last years scan.  She stated that there is no note for this years scan.  Any help would be greatly appreciated.  I don't know who else to reach out to.   Thank You   [name]    Will route this to [facility] and Eric Form to see how we can help this patient. If theres anything I can do to help please route back to me! Thank you!

## 2021-04-16 DIAGNOSIS — J432 Centrilobular emphysema: Secondary | ICD-10-CM

## 2021-04-16 DIAGNOSIS — J9611 Chronic respiratory failure with hypoxia: Secondary | ICD-10-CM

## 2021-04-16 DIAGNOSIS — J449 Chronic obstructive pulmonary disease, unspecified: Secondary | ICD-10-CM

## 2021-04-16 MED ORDER — FLUTICASONE-UMECLIDIN-VILANT 100-62.5-25 MCG/ACT IN AEPB: 1.0000 | INHALATION_SPRAY | Freq: Every day | RESPIRATORY_TRACT | 1 refills | Status: DC

## 2021-04-16 NOTE — Telephone Encounter (Signed)
This is what I have found out please see below  Deberah Castle, April H; [place], [place], [place] Hello,  I reviewed the notations on the previous visits. In 2020, the new code for lung screenings was input in our system with $0 for charges. Things changed in 2021 when some insurance companies were not covering this screening and the patient was informed of the estimate and requested to be billed. Guidelines have changed again for 2023 and new insurance guidelines have been passed surrounding lung screenings among other screenings. It is not a guarantee the patient would be covered at 100% and she could receive a bill for the amount she was quoted and she can opt to be billed as she has done in the past to see what the insurance company will pay, which appears to be what she has requested according to the notations in the account so she would not pay anything on the day of services rendered.   [place]

## 2021-04-18 ENCOUNTER — Ambulatory Visit
Admission: RE | Admit: 2021-04-18 | Discharge: 2021-04-18 | Disposition: A | Discharge status: Home / Self Care | Payer: Medicare HMO | Source: Ambulatory Visit | Attending: Internal Medicine | Admitting: Internal Medicine

## 2021-04-18 DIAGNOSIS — Z1231 Encounter for screening mammogram for malignant neoplasm of breast: Secondary | ICD-10-CM | POA: Diagnosis not present

## 2021-04-23 ENCOUNTER — Other Ambulatory Visit: Payer: Self-pay

## 2021-04-23 ENCOUNTER — Ambulatory Visit (HOSPITAL_COMMUNITY)
Admission: RE | Admit: 2021-04-23 | Discharge: 2021-04-23 | Disposition: A | Discharge status: Home / Self Care | Payer: Medicare HMO | Source: Ambulatory Visit | Attending: Acute Care | Admitting: Acute Care

## 2021-04-23 DIAGNOSIS — Z87891 Personal history of nicotine dependence: Secondary | ICD-10-CM | POA: Diagnosis not present

## 2021-04-25 ENCOUNTER — Other Ambulatory Visit: Payer: Self-pay

## 2021-04-25 DIAGNOSIS — Z87891 Personal history of nicotine dependence: Secondary | ICD-10-CM

## 2021-04-25 NOTE — Telephone Encounter (Signed)
Encounter open in error so closing encounter. °

## 2021-06-10 DIAGNOSIS — L905 Scar conditions and fibrosis of skin: Secondary | ICD-10-CM | POA: Diagnosis not present

## 2021-06-10 DIAGNOSIS — L578 Other skin changes due to chronic exposure to nonionizing radiation: Secondary | ICD-10-CM | POA: Diagnosis not present

## 2021-06-10 DIAGNOSIS — Z85828 Personal history of other malignant neoplasm of skin: Secondary | ICD-10-CM | POA: Diagnosis not present

## 2021-06-10 DIAGNOSIS — Z08 Encounter for follow-up examination after completed treatment for malignant neoplasm: Secondary | ICD-10-CM | POA: Diagnosis not present

## 2021-08-20 ENCOUNTER — Encounter: Payer: Self-pay | Admitting: Internal Medicine

## 2021-08-20 ENCOUNTER — Ambulatory Visit (INDEPENDENT_AMBULATORY_CARE_PROVIDER_SITE_OTHER): Payer: Medicare HMO | Admitting: Internal Medicine

## 2021-08-20 ENCOUNTER — Ambulatory Visit: Payer: Medicare HMO | Admitting: Internal Medicine

## 2021-08-20 VITALS — BP 122/79 | HR 93 | Temp 99.0°F | Wt 134.1 lb

## 2021-08-20 DIAGNOSIS — I10 Essential (primary) hypertension: Secondary | ICD-10-CM

## 2021-08-20 DIAGNOSIS — J9621 Acute and chronic respiratory failure with hypoxia: Secondary | ICD-10-CM | POA: Diagnosis not present

## 2021-08-20 DIAGNOSIS — R030 Elevated blood-pressure reading, without diagnosis of hypertension: Secondary | ICD-10-CM | POA: Diagnosis not present

## 2021-08-20 DIAGNOSIS — J439 Emphysema, unspecified: Secondary | ICD-10-CM | POA: Diagnosis not present

## 2021-08-20 NOTE — Progress Notes (Signed)
Established Patient Office Visit     CC/Reason for Visit: 12-month follow-up chronic medical condition  HPI: Lorann Kristle Wesch is a 71 y.o. female who is coming in today for the above mentioned reasons. Past Medical History is significant for: COPD with chronic hypoxemic respiratory failure, hypertension, hyperlipidemia, osteoporosis on Fosamax.  She was recently told that she needed dental extraction but was told she will need to have this done by oral surgeon as she is on Fosamax.  She is otherwise feeling well.   Past Medical/Surgical History: Past Medical History:  Diagnosis Date   Age-related osteoporosis with current pathological fracture 03/11/2017   DEXA bone scan at [facility] 03/29/2017 T score -3.1 at Rt total femur   COPD (chronic obstructive pulmonary disease) (HCC)    Coronary atherosclerosis of native coronary artery 07/22/2017   Seen on chest Ct 07/21/17. Aortic and branch vessel atherosclerosis. Normal heart size, without pericardial effusion. Multivessel coronary artery atherosclerosis.   Hypertension    Non-traumatic compression fracture of vertebral column ([place]) 02/18/2017   Chest CT 07/21/17: Moderate T5 and moderate to severe T6 compression deformities w/o significant canal encroachment. Moderate T8 compression deformity is also w/o canal encroachment   Squamous cell cancer of external ear, right 03/2017   right, excison by Dr. Benjamine Mola 04/2017    Past Surgical History:  Procedure Laterality Date   EYE SURGERY     MASS EXCISION Right 04/12/2017   Procedure: EXCISION OF RIGHT EAR  MASS;  Surgeon: Leta Baptist, MD;  Location: [place];  Service: ENT;  Laterality: Right; squamous cell cancer with clear margins   SKIN FULL THICKNESS GRAFT Left 04/12/2017   Procedure: SKIN GRAFT FULL THICKNESS FROM ABDOMEN TO RIGHT EAR;  Surgeon: Leta Baptist, MD;  Location: [place];  Service: ENT;  Laterality: Right   TONSILLECTOMY      Social History:   reports that she quit smoking about 4 years ago. Her smoking use included cigarettes. She has never used smokeless tobacco. She reports that she does not drink alcohol and does not use drugs.  Allergies: Allergies  Allergen Reactions   Cefzil [Cefprozil] Other (See Comments)    Burning sensation in lower legs.    Zanaflex [Tizanidine Hcl] Nausea And Vomiting    Family History:  Family History  Problem Relation Age of Onset   Heart attack Mother    Stroke Mother    Lung cancer Father    Arthritis Sister      Current Outpatient Medications:    acetaminophen (TYLENOL) 500 MG tablet, Take 500 mg by mouth every 6 (six) hours as needed for mild pain or moderate pain., Disp: , Rfl:    albuterol (VENTOLIN HFA) 108 (90 Base) MCG/ACT inhaler, Inhale 2 puffs into the lungs every 4 (four) hours as needed for wheezing or shortness of breath (cough, shortness of breath or wheezing.)., Disp: 18 g, Rfl: 1   alendronate (FOSAMAX) 70 MG tablet, Take 1 tablet (70 mg total) by mouth once a week., Disp: 12 tablet, Rfl: 1   aspirin EC 81 MG tablet, Take 1 tablet (81 mg total) by mouth daily., Disp: 30 tablet, Rfl: 11   Fluticasone-Umeclidin-Vilant (TRELEGY ELLIPTA) 100-62.5-25 MCG/ACT AEPB, Inhale 1 puff into the lungs daily., Disp: 180 each, Rfl: 1   hydrochlorothiazide (HYDRODIURIL) 25 MG tablet, Take 1 tablet (25 mg total) by mouth daily., Disp: 90 tablet, Rfl: 1   naproxen sodium (ALEVE) 220 MG tablet, Take 220 mg by mouth  2 (two) times daily as needed (for pain)., Disp: , Rfl:    rosuvastatin (CRESTOR) 10 MG tablet, Take 1 tablet (10 mg total) by mouth daily., Disp: 90 tablet, Rfl: 1  Review of Systems:  Constitutional: Denies fever, chills, diaphoresis, appetite change and fatigue.  HEENT: Denies photophobia, eye pain, redness, hearing loss, ear pain, congestion, sore throat, rhinorrhea, sneezing, mouth sores, trouble swallowing, neck pain, neck stiffness and tinnitus.   Respiratory: Denies SOB,  DOE, cough, chest tightness,  and wheezing.   Cardiovascular: Denies chest pain, palpitations and leg swelling.  Gastrointestinal: Denies nausea, vomiting, abdominal pain, diarrhea, constipation, blood in stool and abdominal distention.  Genitourinary: Denies dysuria, urgency, frequency, hematuria, flank pain and difficulty urinating.  Endocrine: Denies: hot or cold intolerance, sweats, changes in hair or nails, polyuria, polydipsia. Musculoskeletal: Denies myalgias, back pain, joint swelling, arthralgias and gait problem.  Skin: Denies pallor, rash and wound.  Neurological: Denies dizziness, seizures, syncope, weakness, light-headedness, numbness and headaches.  Hematological: Denies adenopathy. Easy bruising, personal or family bleeding history  Psychiatric/Behavioral: Denies suicidal ideation, mood changes, confusion, nervousness, sleep disturbance and agitation    Physical Exam: Vitals:   08/20/21 1031 08/20/21 1100  BP: (!) 162/82 122/79  Pulse: 93   Temp: 99 F (37.2 C)   TempSrc: Oral   SpO2: 91%   Weight: 134 lb 1.6 oz (60.8 kg)     Body mass index is 24.53 kg/m.   Constitutional: NAD, calm, comfortable Eyes: PERRL, lids and conjunctivae normal, wears corrective lenses ENMT: Mucous membranes are moist.  Respiratory: clear to auscultation bilaterally, no wheezing, no crackles. Normal respiratory effort. No accessory muscle use.  Cardiovascular: Regular rate and rhythm, no murmurs / rubs / gallops. No extremity edema.  Neurologic: Grossly intact and nonfocal Psychiatric: Normal judgment and insight. Alert and oriented x 3. Normal mood.    Impression and Plan:  Acute on chronic respiratory failure with hypoxia ([place]) Pulmonary emphysema, unspecified emphysema type (HCC) -Chronic followed by pulmonary  Essential hypertension -Well-documented history of whitecoat syndrome.  Home blood pressure with heart rate between 110/57-122/79.  Osteoporosis -Not sure why she was  informed she cannot have dental extraction while on Fosamax, she will follow-up with oral surgeon as directed, have advised that if necessary she can come off Fosamax while getting her teeth extracted.    Time spent:33 minutes reviewing chart, interviewing and examining patient and formulating plan of care.     Lelon Frohlich, MD [facility] at [facility]

## 2021-09-10 DIAGNOSIS — L570 Actinic keratosis: Secondary | ICD-10-CM | POA: Diagnosis not present

## 2021-09-10 DIAGNOSIS — H61031 Chondritis of right external ear: Secondary | ICD-10-CM | POA: Diagnosis not present

## 2021-09-10 DIAGNOSIS — D225 Melanocytic nevi of trunk: Secondary | ICD-10-CM | POA: Diagnosis not present

## 2021-09-10 DIAGNOSIS — L821 Other seborrheic keratosis: Secondary | ICD-10-CM | POA: Diagnosis not present

## 2021-09-10 DIAGNOSIS — L814 Other melanin hyperpigmentation: Secondary | ICD-10-CM | POA: Diagnosis not present

## 2021-09-14 ENCOUNTER — Other Ambulatory Visit: Payer: Self-pay | Admitting: Internal Medicine

## 2021-09-14 DIAGNOSIS — I2510 Atherosclerotic heart disease of native coronary artery without angina pectoris: Secondary | ICD-10-CM

## 2021-09-14 DIAGNOSIS — R030 Elevated blood-pressure reading, without diagnosis of hypertension: Secondary | ICD-10-CM

## 2021-09-18 ENCOUNTER — Telehealth: Payer: Self-pay | Admitting: Internal Medicine

## 2021-09-18 DIAGNOSIS — M8000XD Age-related osteoporosis with current pathological fracture, unspecified site, subsequent encounter for fracture with routine healing: Secondary | ICD-10-CM

## 2021-09-18 NOTE — Telephone Encounter (Signed)
Pt requesting a referral for a DEXA scan to be sent to [facility] [phone]

## 2021-09-18 NOTE — Telephone Encounter (Signed)
Referral placed.

## 2021-10-02 ENCOUNTER — Other Ambulatory Visit: Payer: Self-pay | Admitting: Pulmonary Disease

## 2021-10-02 DIAGNOSIS — J432 Centrilobular emphysema: Secondary | ICD-10-CM

## 2021-10-02 DIAGNOSIS — J449 Chronic obstructive pulmonary disease, unspecified: Secondary | ICD-10-CM

## 2021-10-02 DIAGNOSIS — J9611 Chronic respiratory failure with hypoxia: Secondary | ICD-10-CM

## 2021-10-13 ENCOUNTER — Telehealth: Payer: Self-pay | Admitting: Pulmonary Disease

## 2021-10-13 DIAGNOSIS — J432 Centrilobular emphysema: Secondary | ICD-10-CM

## 2021-10-13 DIAGNOSIS — J449 Chronic obstructive pulmonary disease, unspecified: Secondary | ICD-10-CM

## 2021-10-13 DIAGNOSIS — J9611 Chronic respiratory failure with hypoxia: Secondary | ICD-10-CM

## 2021-10-13 MED ORDER — FLUTICASONE-UMECLIDIN-VILANT 100-62.5-25 MCG/ACT IN AEPB: 1.0000 | INHALATION_SPRAY | Freq: Every day | RESPIRATORY_TRACT | 0 refills | Status: DC

## 2021-10-13 NOTE — Telephone Encounter (Signed)
Refill sent in. Patient made aware. Nothing further needed

## 2021-12-09 ENCOUNTER — Ambulatory Visit: Payer: Medicare HMO | Admitting: Pulmonary Disease

## 2021-12-09 ENCOUNTER — Encounter: Payer: Self-pay | Admitting: Pulmonary Disease

## 2021-12-09 VITALS — BP 140/86 | HR 76 | Temp 97.8°F | Ht 61.75 in | Wt 133.8 lb

## 2021-12-09 DIAGNOSIS — J439 Emphysema, unspecified: Secondary | ICD-10-CM | POA: Diagnosis not present

## 2021-12-09 DIAGNOSIS — J432 Centrilobular emphysema: Secondary | ICD-10-CM

## 2021-12-09 DIAGNOSIS — G4736 Sleep related hypoventilation in conditions classified elsewhere: Secondary | ICD-10-CM | POA: Diagnosis not present

## 2021-12-09 NOTE — Patient Instructions (Signed)
Will arrange for humidifier to use with your oxygen set up  Follow up in March 2024

## 2021-12-09 NOTE — Progress Notes (Addendum)
[facility]  Chief Complaint  Patient presents with   Follow-up    Breathing doing well since last ov.  Nose gets dried out after using O2 at night     Constitutional:  BP (!) 140/86 (BP Location: Left Arm, Patient Position: Sitting)   Pulse 76   Temp 97.8 F (36.6 C) (Temporal)   Ht 5' 1.75" (1.568 m)   Wt 133 lb 12.8 oz (60.7 kg)   SpO2 92% Comment: ra  BMI 24.67 kg/m   Past Medical History:  HTN, CAD, Osteoporosis, COVID September 2022  Past Surgical History:  She  has a past surgical history that includes Tonsillectomy; Eye surgery; Mass excision (Right, 04/12/2017); and Full thickness skin graft (Left, 04/12/2017).  Brief Summary:  Beth [name] is a 71 y.o. female former smoker with emphysema and right middle lung scarring.      Subjective:   She had COVID in September 2022.  No issues after.  Had low dose CT in February.  Stable nodules and emphysema.  She is getting dry in her nose from using oxygen at night.  Not having cough, wheeze, sputum, or chest pain.  Sleeping well otherwise.  Gets winded when she has to bends over or when walking up a hill.  Does okay on level ground.  Physical Exam:   Appearance - well kempt   ENMT - no sinus tenderness, no oral exudate, no LAN, Mallampati 3 airway, no stridor  Respiratory - equal breath sounds bilaterally, no wheezing or rales  CV - s1s2 regular rate and rhythm, no murmurs  Ext - no clubbing, no edema  Skin - no rashes  Psych - normal mood and affect   Pulmonary testing:  PFT 11/09/17 >> FEV1 0.71 (35%), FEV1% 41, TLC 5.52 (119%), DLCO 48%, no BD A1AT 10/04/17 >> 108, MS PFT 11/09/17 >> FEV1 0.71 (35%), FEV1% 41, TLC 5.52 (119%), RV 3.71 (182%), DLCO 48, no BD Ambulatory oximetry RA 12/05/18 >> SpO2 88%  Chest Imaging:  CT chest 07/21/17 >> atherosclerosis, bullous emphysema, RML partial collapse LDCT chest 02/28/19 >> mild/mod emphysema, 2.8 RUL nodule  new, fatty liver, atherosclerosis LDCT chest 02/29/20 >> Lung RADS 2 LDCT 04/24/21 >> Lung RADS 2  Sleep Tests:  ONO with RA 02/06/19 >> test time 4 hrs 23 min.  Baseline SpO2 86%, low SpO2 74%.  Spent 3 hrs 30 min with SpO2 < 88%.  Cardiac Tests:    Social History:  She  reports that she quit smoking about 4 years ago. Her smoking use included cigarettes. She has never used smokeless tobacco. She reports that she does not drink alcohol and does not use drugs.  Family History:  Her family history includes Arthritis in her sister; Heart attack in her mother; Lung cancer in her father; Stroke in her mother.     Assessment/Plan:   COPD with emphysema. - continue trelegy 100 one puff daily; she qualified for financial assistance and has 0 dollar copay with 3 months supplies - prn albuterol   Chronic respiratory failure with hypoxia. - 2 liters oxygen at night and prn with exertion - will get her set up with humidifier to use with oxygen equipment   History of tobacco abuse. - f/u low dose CT chest in February 2024 as part of lung cancer screening  Preoperative respiratory assessment. - she plans to have dental extraction in [facility] in December 2023 - explained that there are no pulmonary contraindications to her proceeding  with surgery if her respiratory status remains stable from now to then   Time Spent Involved in Patient Care on Day of Examination:  27 minutes  Follow up:   Patient Instructions  Will arrange for humidifier to use with your oxygen set up  Follow up in March 2024  Medication List:   Allergies as of 12/09/2021       Reactions   Cefzil [cefprozil] Other (See Comments)   Burning sensation in lower legs.    Zanaflex [tizanidine Hcl] Nausea And Vomiting        Medication List        Accurate as of December 09, 2021 11:05 AM. If you have any questions, ask your nurse or doctor.          acetaminophen 500 MG tablet Commonly known as:  TYLENOL Take 500 mg by mouth every 6 (six) hours as needed for mild pain or moderate pain.   albuterol 108 (90 Base) MCG/ACT inhaler Commonly known as: VENTOLIN HFA Inhale 2 puffs into the lungs every 4 (four) hours as needed for wheezing or shortness of breath (cough, shortness of breath or wheezing.).   alendronate 70 MG tablet Commonly known as: FOSAMAX Take 1 tablet (70 mg total) by mouth once a week.   aspirin EC 81 MG tablet Take 1 tablet (81 mg total) by mouth daily.   Fluticasone-Umeclidin-Vilant 100-62.5-25 MCG/ACT Aepb Commonly known as: Trelegy Ellipta Inhale 1 puff into the lungs daily.   hydrochlorothiazide 25 MG tablet Commonly known as: HYDRODIURIL Take 1 tablet by mouth once daily   naproxen sodium 220 MG tablet Commonly known as: ALEVE Take 220 mg by mouth 2 (two) times daily as needed (for pain).   rosuvastatin 10 MG tablet Commonly known as: CRESTOR Take 1 tablet by mouth once daily        Signature:  Chesley Mires, MD [place] Pager - [phone] 12/09/2021, 11:05 AM

## 2021-12-18 DIAGNOSIS — H04123 Dry eye syndrome of bilateral lacrimal glands: Secondary | ICD-10-CM | POA: Diagnosis not present

## 2021-12-20 ENCOUNTER — Other Ambulatory Visit: Payer: Self-pay | Admitting: Internal Medicine

## 2021-12-20 DIAGNOSIS — I2510 Atherosclerotic heart disease of native coronary artery without angina pectoris: Secondary | ICD-10-CM

## 2021-12-20 DIAGNOSIS — R030 Elevated blood-pressure reading, without diagnosis of hypertension: Secondary | ICD-10-CM

## 2021-12-29 ENCOUNTER — Other Ambulatory Visit: Payer: Self-pay | Admitting: Pulmonary Disease

## 2021-12-29 DIAGNOSIS — J432 Centrilobular emphysema: Secondary | ICD-10-CM

## 2021-12-29 DIAGNOSIS — J9611 Chronic respiratory failure with hypoxia: Secondary | ICD-10-CM

## 2021-12-29 DIAGNOSIS — J449 Chronic obstructive pulmonary disease, unspecified: Secondary | ICD-10-CM

## 2022-02-12 ENCOUNTER — Other Ambulatory Visit: Payer: Self-pay | Admitting: Internal Medicine

## 2022-02-12 ENCOUNTER — Ambulatory Visit (INDEPENDENT_AMBULATORY_CARE_PROVIDER_SITE_OTHER): Payer: Medicare HMO | Admitting: Internal Medicine

## 2022-02-12 VITALS — BP 130/80 | HR 88 | Temp 98.2°F | Ht 61.75 in | Wt 129.5 lb

## 2022-02-12 DIAGNOSIS — J439 Emphysema, unspecified: Secondary | ICD-10-CM | POA: Diagnosis not present

## 2022-02-12 DIAGNOSIS — Z0000 Encounter for general adult medical examination without abnormal findings: Secondary | ICD-10-CM

## 2022-02-12 DIAGNOSIS — M8000XD Age-related osteoporosis with current pathological fracture, unspecified site, subsequent encounter for fracture with routine healing: Secondary | ICD-10-CM

## 2022-02-12 DIAGNOSIS — I10 Essential (primary) hypertension: Secondary | ICD-10-CM | POA: Diagnosis not present

## 2022-02-12 DIAGNOSIS — R739 Hyperglycemia, unspecified: Secondary | ICD-10-CM | POA: Diagnosis not present

## 2022-02-12 DIAGNOSIS — Z1211 Encounter for screening for malignant neoplasm of colon: Secondary | ICD-10-CM

## 2022-02-12 DIAGNOSIS — Z13820 Encounter for screening for osteoporosis: Secondary | ICD-10-CM

## 2022-02-12 DIAGNOSIS — J029 Acute pharyngitis, unspecified: Secondary | ICD-10-CM

## 2022-02-12 DIAGNOSIS — J020 Streptococcal pharyngitis: Secondary | ICD-10-CM | POA: Diagnosis not present

## 2022-02-12 LAB — COMPREHENSIVE METABOLIC PANEL
ALT: 18 U/L (ref 0–35)
AST: 27 U/L (ref 0–37)
Albumin: 4.6 g/dL (ref 3.5–5.2)
Alkaline Phosphatase: 63 U/L (ref 39–117)
BUN: 14 mg/dL (ref 6–23)
CO2: 31 mEq/L (ref 19–32)
Calcium: 10.1 mg/dL (ref 8.4–10.5)
Chloride: 97 mEq/L (ref 96–112)
Creatinine, Ser: 0.69 mg/dL (ref 0.40–1.20)
GFR: 85.92 mL/min (ref >60.00)
Glucose, Bld: 108 mg/dL — ABNORMAL HIGH (ref 70–99)
Potassium: 4.3 mEq/L (ref 3.5–5.1)
Sodium: 136 mEq/L (ref 135–145)
Total Bilirubin: 0.5 mg/dL (ref 0.2–1.2)
Total Protein: 8 g/dL (ref 6.0–8.3)

## 2022-02-12 LAB — CBC WITH DIFFERENTIAL/PLATELET
Basophils Absolute: 0.1 10*3/uL (ref 0.0–0.1)
Basophils Relative: 1.3 % (ref 0.0–3.0)
Eosinophils Absolute: 0.1 10*3/uL (ref 0.0–0.7)
Eosinophils Relative: 1.2 % (ref 0.0–5.0)
HCT: 43.9 % (ref 36.0–46.0)
Hemoglobin: 14.7 g/dL (ref 12.0–15.0)
Lymphocytes Relative: 18.7 % (ref 12.0–46.0)
Lymphs Abs: 1.4 10*3/uL (ref 0.7–4.0)
MCHC: 33.4 g/dL (ref 30.0–36.0)
MCV: 90.1 fl (ref 78.0–100.0)
Monocytes Absolute: 0.7 10*3/uL (ref 0.1–1.0)
Monocytes Relative: 9.6 % (ref 3.0–12.0)
Neutro Abs: 5.3 10*3/uL (ref 1.4–7.7)
Neutrophils Relative %: 69.2 % (ref 43.0–77.0)
Platelets: 248 10*3/uL (ref 150.0–400.0)
RBC: 4.87 Mil/uL (ref 3.87–5.11)
RDW: 13.6 % (ref 11.5–15.5)
WBC: 7.7 10*3/uL (ref 4.0–10.5)

## 2022-02-12 LAB — POCT RAPID STREP A (OFFICE): Rapid Strep A Screen: POSITIVE — AB

## 2022-02-12 LAB — LIPID PANEL
Cholesterol: 118 mg/dL (ref 0–200)
HDL: 67.3 mg/dL (ref >39.00)
LDL Cholesterol: 35 mg/dL (ref 0–99)
NonHDL: 50.34
Total CHOL/HDL Ratio: 2
Triglycerides: 77 mg/dL (ref 0.0–149.0)
VLDL: 15.4 mg/dL (ref 0.0–40.0)

## 2022-02-12 LAB — VITAMIN B12: Vitamin B-12: 316 pg/mL (ref 211–911)

## 2022-02-12 LAB — HEMOGLOBIN A1C: Hgb A1c MFr Bld: 6.3 % (ref 4.6–6.5)

## 2022-02-12 LAB — VITAMIN D 25 HYDROXY (VIT D DEFICIENCY, FRACTURES): VITD: 56.12 ng/mL (ref 30.00–100.00)

## 2022-02-12 LAB — TSH: TSH: 0.69 u[IU]/mL (ref 0.35–5.50)

## 2022-02-12 MED ORDER — AMOXICILLIN 500 MG PO CAPS: 500.0000 mg | ORAL_CAPSULE | Freq: Two times a day (BID) | ORAL | 0 refills | Status: AC

## 2022-02-12 NOTE — Patient Instructions (Signed)
-  Nice seeing you today!!  -Lab work today; will notify you once results are available.  -Consider updating COVID and RSV vaccines.  -Schedule follow up in 6 months.

## 2022-02-12 NOTE — Progress Notes (Signed)
Established Patient Office Visit     CC/Reason for Visit: Annual preventive exam and subsequent Medicare wellness visit  HPI: Beth [name] is a 71 y.o. female who is coming in today for the above mentioned reasons. Past Medical History is significant for: COPD followed by pulmonary, hypertension, hyperlipidemia, osteoporosis.  She is feeling well and has no acute complaints, she has routine eye and dental care.  No perceived hearing difficulty she exercises on a stationary bike about 1.5 hours a day.  She is due for COVID and RSV vaccines.  She is due for bone density scan as well as colon cancer screening.   Past Medical/Surgical History: Past Medical History:  Diagnosis Date   Age-related osteoporosis with current pathological fracture 03/11/2017   [facility] at [facility] 03/29/2017 T score -3.1 at Rt total femur   COPD (chronic obstructive pulmonary disease) (HCC)    Coronary atherosclerosis of native coronary artery 07/22/2017   Seen on chest Ct 07/21/17. Aortic and branch vessel atherosclerosis. Normal heart size, without pericardial effusion. Multivessel coronary artery atherosclerosis.   Hypertension    Non-traumatic compression fracture of vertebral column ([place]) 02/18/2017   Chest CT 07/21/17: Moderate T5 and moderate to severe T6 compression deformities w/o significant canal encroachment. Moderate T8 compression deformity is also w/o canal encroachment   Squamous cell cancer of external ear, right 03/2017   right, excison by Dr. Benjamine Mola 04/2017    Past Surgical History:  Procedure Laterality Date   EYE SURGERY     MASS EXCISION Right 04/12/2017   Procedure: EXCISION OF RIGHT EAR  MASS;  Surgeon: Leta Baptist, MD;  Location: [place];  Service: ENT;  Laterality: Right; squamous cell cancer with clear margins   SKIN FULL THICKNESS GRAFT Left 04/12/2017   Procedure: SKIN GRAFT FULL THICKNESS FROM ABDOMEN TO RIGHT EAR;  Surgeon: Leta Baptist, MD;  Location:  [place];  Service: ENT;  Laterality: Right   TONSILLECTOMY      Social History:  reports that she quit smoking about 5 years ago. Her smoking use included cigarettes. She has never used smokeless tobacco. She reports that she does not drink alcohol and does not use drugs.  Allergies: Allergies  Allergen Reactions   Cefzil [Cefprozil] Other (See Comments)    Burning sensation in lower legs.    Zanaflex [Tizanidine Hcl] Nausea And Vomiting    Family History:  Family History  Problem Relation Age of Onset   Heart attack Mother    Stroke Mother    Lung cancer Father    Arthritis Sister      Current Outpatient Medications:    amoxicillin (AMOXIL) 500 MG capsule, Take 1 capsule (500 mg total) by mouth 2 (two) times daily for 10 days., Disp: 20 capsule, Rfl: 0   acetaminophen (TYLENOL) 500 MG tablet, Take 500 mg by mouth every 6 (six) hours as needed for mild pain or moderate pain., Disp: , Rfl:    albuterol (VENTOLIN HFA) 108 (90 Base) MCG/ACT inhaler, Inhale 2 puffs into the lungs every 4 (four) hours as needed for wheezing or shortness of breath (cough, shortness of breath or wheezing.)., Disp: 18 g, Rfl: 1   alendronate (FOSAMAX) 70 MG tablet, Take 1 tablet (70 mg total) by mouth once a week., Disp: 12 tablet, Rfl: 1   aspirin EC 81 MG tablet, Take 1 tablet (81 mg total) by mouth daily., Disp: 30 tablet, Rfl: 11   hydrochlorothiazide (HYDRODIURIL) 25 MG  tablet, Take 1 tablet by mouth once daily, Disp: 90 tablet, Rfl: 0   naproxen sodium (ALEVE) 220 MG tablet, Take 220 mg by mouth 2 (two) times daily as needed (for pain)., Disp: , Rfl:    rosuvastatin (CRESTOR) 10 MG tablet, Take 1 tablet by mouth once daily, Disp: 90 tablet, Rfl: 0   TRELEGY ELLIPTA 100-62.5-25 MCG/ACT AEPB, INHALE 1 PUFF INTO THE LUNGS DAILY., Disp: 180 each, Rfl: 10  Review of Systems:  Constitutional: Denies fever, chills, diaphoresis, appetite change and fatigue.  HEENT: Denies photophobia,  eye pain, redness, hearing loss, ear pain, congestion, sore throat, rhinorrhea, sneezing, mouth sores, trouble swallowing, neck pain, neck stiffness and tinnitus.   Respiratory: Denies SOB, DOE, cough, chest tightness,  and wheezing.   Cardiovascular: Denies chest pain, palpitations and leg swelling.  Gastrointestinal: Denies nausea, vomiting, abdominal pain, diarrhea, constipation, blood in stool and abdominal distention.  Genitourinary: Denies dysuria, urgency, frequency, hematuria, flank pain and difficulty urinating.  Endocrine: Denies: hot or cold intolerance, sweats, changes in hair or nails, polyuria, polydipsia. Musculoskeletal: Denies myalgias, back pain, joint swelling, arthralgias and gait problem.  Skin: Denies pallor, rash and wound.  Neurological: Denies dizziness, seizures, syncope, weakness, light-headedness, numbness and headaches.  Hematological: Denies adenopathy. Easy bruising, personal or family bleeding history  Psychiatric/Behavioral: Denies suicidal ideation, mood changes, confusion, nervousness, sleep disturbance and agitation    Physical Exam: Vitals:   02/12/22 0919  BP: 130/80  Pulse: 88  Temp: 98.2 F (36.8 C)  TempSrc: Oral  SpO2: 95%  Weight: 129 lb 8 oz (58.7 kg)  Height: 5' 1.75" (1.568 m)    Body mass index is 23.88 kg/m.   Constitutional: NAD, calm, comfortable Eyes: PERRL, lids and conjunctivae normal, wears corrective lenses ENMT: Mucous membranes are moist. Posterior pharynx is erythematous with significant plaques and exudates.   Tympanic membrane is pearly white, no erythema or bulging. Neck: normal, supple, no masses, no thyromegaly Respiratory: clear to auscultation bilaterally, no wheezing, no crackles. Normal respiratory effort. No accessory muscle use.  Cardiovascular: Regular rate and rhythm, no murmurs / rubs / gallops. No extremity edema. 2+ pedal pulses. No carotid bruits.  Abdomen: no tenderness, no masses palpated. No  hepatosplenomegaly. Bowel sounds positive.  Musculoskeletal: no clubbing / cyanosis. No joint deformity upper and lower extremities. Good ROM, no contractures. Normal muscle tone.  Skin: no rashes, lesions, ulcers. No induration Neurologic: CN 2-12 grossly intact. Sensation intact, DTR normal. Strength 5/5 in all 4.  Psychiatric: Normal judgment and insight. Alert and oriented x 3. Normal mood.   Subsequent Medicare wellness visit   1. Risk factors, based on past  M,S,F -cardiovascular disease risk factors include age, history of hypertension and hyperlipidemia   2.  Physical activities: Stationary bike daily   3.  Depression/mood: Stable, not depressed   4.  Hearing: No perceived issues   5.  ADL's: Independent in all ADLs   6.  Fall risk: Low fall risk   7.  Home safety: No problems identified   8.  Height weight, and visual acuity: height and weight as above, vision:  Vision Screening   Right eye Left eye Both eyes  Without correction     With correction 20/40 20/40 20/40      9.  Counseling: Advised to update age-appropriate vaccines and cancer screenings   10. Lab orders based on risk factors: Laboratory update will be reviewed   11. Referral : None today   12. Care plan: Follow-up with me in 6  months   13. Cognitive assessment: No cognitive impairment   14. Screening: Patient provided with a written and personalized 5-10 year screening schedule in the AVS. yes   15. Provider List Update: PCP, pulmonologist  16. Advance Directives: Full code   17. Opioids: Patient is not on any opioid prescriptions and has no risk factors for a substance use disorder.   [place] Office Visit from 02/12/2022 in [place] at [facility]  PHQ-9 Total Score 0          11/20/2020    7:19 AM 02/19/2021    8:44 AM 03/24/2021    9:51 AM 08/20/2021   10:37 AM 02/12/2022    9:30 AM  Fall Risk  Falls in the past year?  0 0 0 0  Was there an injury with Fall?    0 0   Fall Risk Category Calculator    0 0  Fall Risk Category    Low Low  Patient Fall Risk Level Moderate fall risk Low fall risk  Low fall risk Low fall risk  Patient at Risk for Falls Due to    No Fall Risks No Fall Risks  Fall risk Follow up    Falls evaluation completed Falls evaluation completed      Impression and Plan:  Encounter for preventive health examination  Hyperglycemia - Plan: Hemoglobin A1c, Hemoglobin A1c, CANCELED: POCT glycosylated hemoglobin (Hb A1C)  Screening for malignant neoplasm of colon - Plan: Cologuard  Age-related osteoporosis with current pathological fracture with routine healing, subsequent encounter  Pulmonary emphysema, unspecified emphysema type ([place]) - Plan: TSH, VITAMIN D 25 Hydroxy (Vit-D Deficiency, Fractures), VITAMIN D 25 Hydroxy (Vit-D Deficiency, Fractures), TSH  Essential hypertension - Plan: CBC with Differential/Platelet, Comprehensive metabolic panel, Lipid panel, Vitamin B12, Vitamin B12, Lipid panel, Comprehensive metabolic panel, CBC with Differential/Platelet  Sore throat - Plan: POC Rapid Strep A  Strep pharyngitis  Screening for osteoporosis - Plan: DG Bone Density  -Recommend routine eye and dental care. -Immunizations: Advised to update COVID and RSV vaccines at pharmacy, she declines today despite counseling -Healthy lifestyle discussed in detail. -Labs to be updated today. -Colon cancer screening: Cologuard sent per patient preference -Breast cancer screening: 04/2021 -Cervical cancer screening: Elects to defer -Lung cancer screening: Not applicable -Prostate cancer screening: Not applicable -DEXA: Overdue, will request  -Because of beefy red pharynx and exudate seen on exam, we have performed a rapid strep test which has resulted positive.  I will send in a prescription for amoxicillin for 10 days.     Patient Instructions  -Nice seeing you today!!  -Lab work today; will notify you once results are  available.  -Consider updating COVID and RSV vaccines.  -Schedule follow up in 6 months.      Lelon Frohlich, MD  Primary Care at [facility]

## 2022-02-19 ENCOUNTER — Ambulatory Visit: Payer: Medicare HMO | Admitting: Internal Medicine

## 2022-02-19 ENCOUNTER — Encounter: Payer: Self-pay | Admitting: Internal Medicine

## 2022-02-19 DIAGNOSIS — R7302 Impaired glucose tolerance (oral): Secondary | ICD-10-CM | POA: Insufficient documentation

## 2022-02-21 DIAGNOSIS — Z1211 Encounter for screening for malignant neoplasm of colon: Secondary | ICD-10-CM | POA: Diagnosis not present

## 2022-02-28 LAB — COLOGUARD: COLOGUARD: NEGATIVE

## 2022-03-11 ENCOUNTER — Encounter: Payer: Self-pay | Admitting: Internal Medicine

## 2022-03-11 LAB — COLOGUARD: Cologuard: NEGATIVE

## 2022-03-17 ENCOUNTER — Other Ambulatory Visit: Payer: Self-pay | Admitting: Internal Medicine

## 2022-03-17 DIAGNOSIS — I2510 Atherosclerotic heart disease of native coronary artery without angina pectoris: Secondary | ICD-10-CM

## 2022-03-17 DIAGNOSIS — R030 Elevated blood-pressure reading, without diagnosis of hypertension: Secondary | ICD-10-CM

## 2022-03-19 ENCOUNTER — Other Ambulatory Visit: Payer: Self-pay

## 2022-03-19 DIAGNOSIS — Z87891 Personal history of nicotine dependence: Secondary | ICD-10-CM

## 2022-03-19 DIAGNOSIS — Z122 Encounter for screening for malignant neoplasm of respiratory organs: Secondary | ICD-10-CM

## 2022-03-31 NOTE — Progress Notes (Signed)
This encounter was created in error - please disregard.

## 2022-04-03 ENCOUNTER — Other Ambulatory Visit: Payer: Self-pay | Admitting: Internal Medicine

## 2022-04-03 DIAGNOSIS — M810 Age-related osteoporosis without current pathological fracture: Secondary | ICD-10-CM

## 2022-04-20 ENCOUNTER — Ambulatory Visit
Admission: RE | Admit: 2022-04-20 | Discharge: 2022-04-20 | Disposition: A | Discharge status: Home / Self Care | Payer: Medicare HMO | Source: Ambulatory Visit | Attending: Internal Medicine | Admitting: Internal Medicine

## 2022-04-20 DIAGNOSIS — Z13820 Encounter for screening for osteoporosis: Secondary | ICD-10-CM

## 2022-04-20 DIAGNOSIS — Z1231 Encounter for screening mammogram for malignant neoplasm of breast: Secondary | ICD-10-CM | POA: Diagnosis not present

## 2022-04-27 ENCOUNTER — Ambulatory Visit (HOSPITAL_COMMUNITY)
Admission: RE | Admit: 2022-04-27 | Discharge: 2022-04-27 | Disposition: A | Discharge status: Home / Self Care | Payer: Medicare HMO | Source: Ambulatory Visit | Attending: Acute Care | Admitting: Acute Care

## 2022-04-27 DIAGNOSIS — Z122 Encounter for screening for malignant neoplasm of respiratory organs: Secondary | ICD-10-CM | POA: Diagnosis not present

## 2022-04-27 DIAGNOSIS — Z87891 Personal history of nicotine dependence: Secondary | ICD-10-CM | POA: Insufficient documentation

## 2022-04-28 ENCOUNTER — Other Ambulatory Visit: Payer: Self-pay

## 2022-04-28 DIAGNOSIS — Z122 Encounter for screening for malignant neoplasm of respiratory organs: Secondary | ICD-10-CM

## 2022-04-28 DIAGNOSIS — Z87891 Personal history of nicotine dependence: Secondary | ICD-10-CM

## 2022-04-29 IMAGING — CT CT CHEST LUNG CANCER SCREENING LOW DOSE W/O CM
1 of 2 series · 10 of 20 positions shown, 13 images · non-contrast
Comparison: 02/28/2019.

CLINICAL DATA: 69-year-old female with 52 pack-year history of
smoking. Lung cancer screening.

EXAM:
CT CHEST WITHOUT CONTRAST LOW-DOSE FOR LUNG CANCER SCREENING
TECHNIQUE: Multidetector CT imaging of the chest was performed following the
standard protocol without IV contrast.

[ct lung segmentation data · axial · 0.71mm/px · z∈[+1103,+1103]mm · 10 of 156 frames shown]
[frame 1/156  mediastinal]
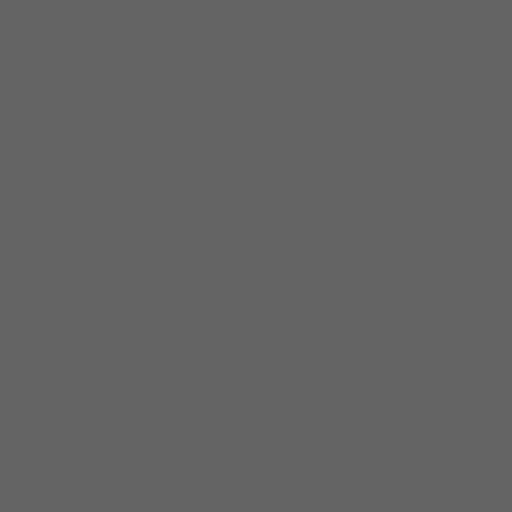
[frame 1/156  lung]
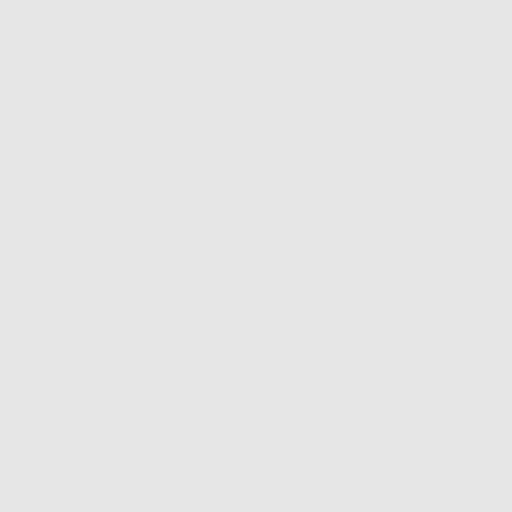
[frame 18/156  lung]
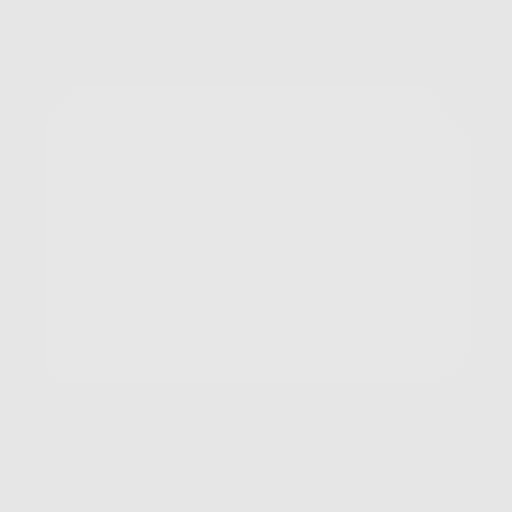
[frame 35/156  lung]
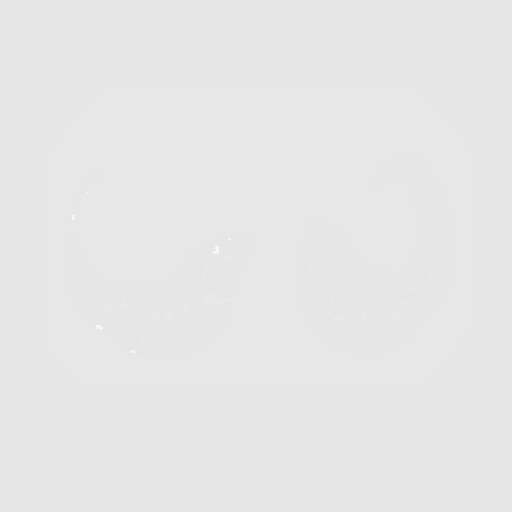
[frame 52/156  lung]
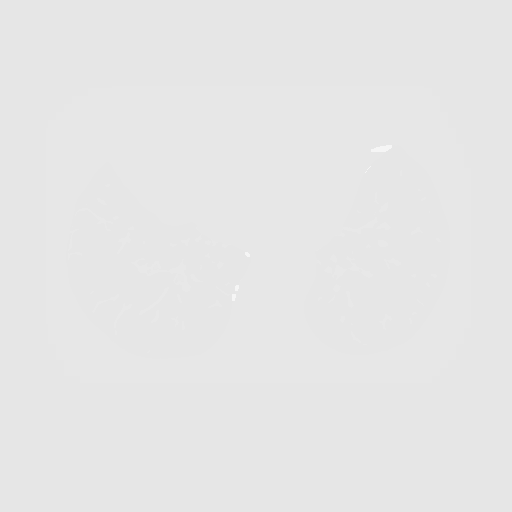
[frame 69/156  mediastinal]
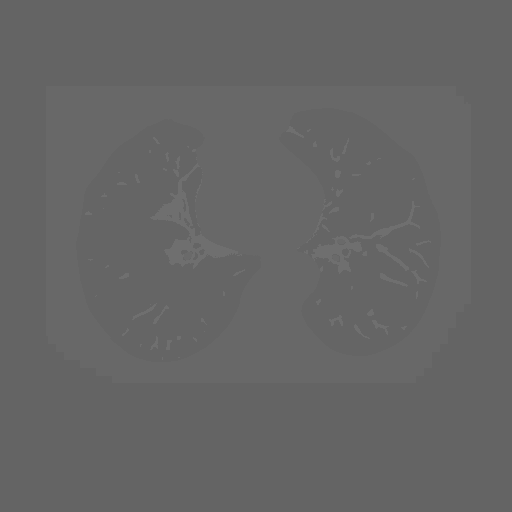
[frame 69/156  lung]
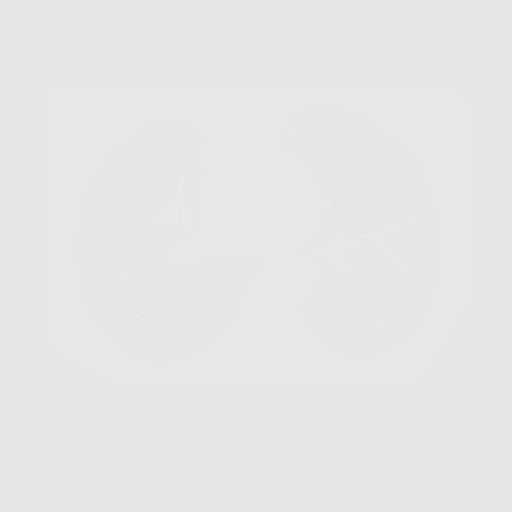
[frame 87/156  lung]
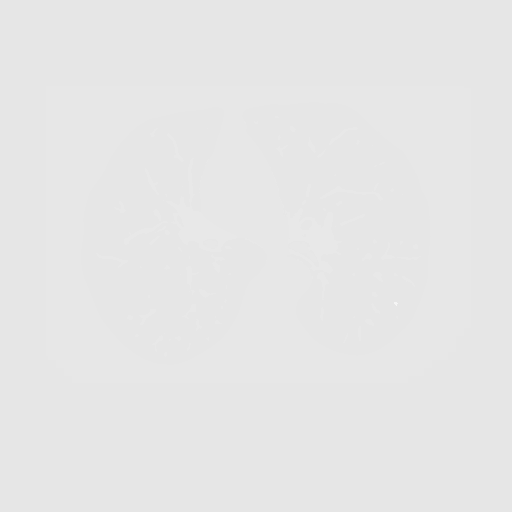
[frame 104/156  lung]
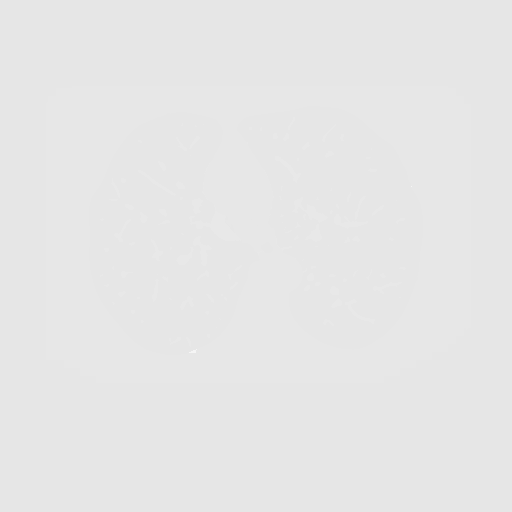
[frame 121/156  lung]
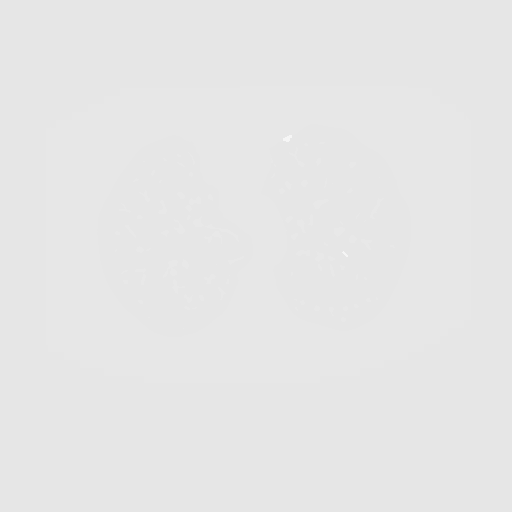
[frame 138/156  mediastinal]
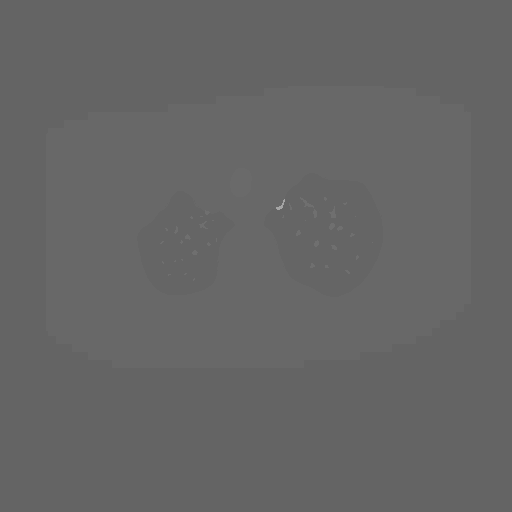
[frame 138/156  lung]
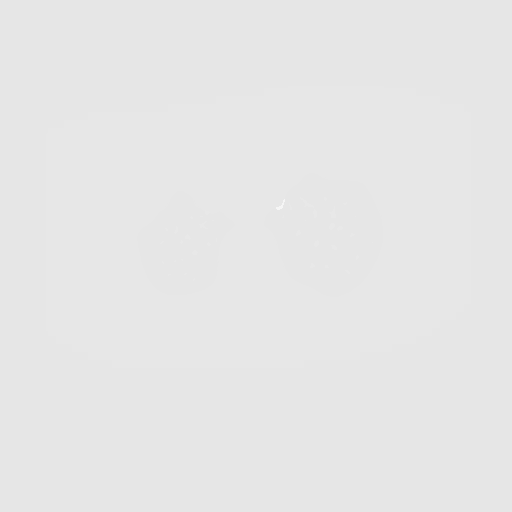
[frame 156/156  lung]
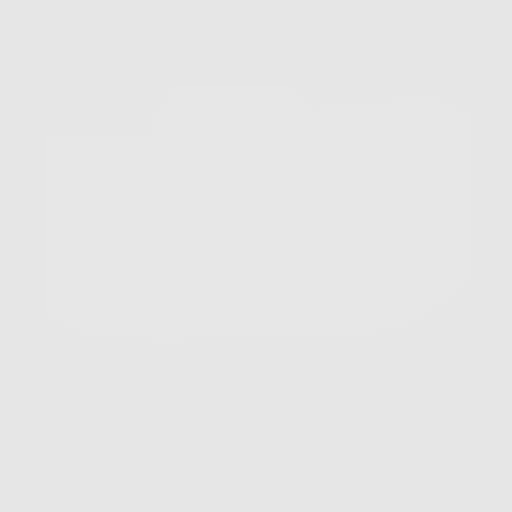

[10 of 20 positions shown; findings below may reference images not displayed]

FINDINGS: Cardiovascular: The heart size is normal. No substantial pericardial
effusion. Coronary artery calcification is evident. Atherosclerotic
calcification is noted in the wall of the thoracic aorta. Slight
decrease in benign appearing right pericardial fluid density lesion.

Mediastinum/Nodes: No mediastinal lymphadenopathy. No evidence for
gross hilar lymphadenopathy although assessment is limited by the
lack of intravenous contrast on today's study. The esophagus has
normal imaging features. There is no axillary lymphadenopathy.

Lungs/Pleura: Centrilobular emphsyema noted. Tiny bilateral
pulmonary nodules measure up to maximum volume derived equivalent
diameter of 3.5 mm. No suspicious pulmonary nodule or mass. No focal
airspace consolidation. No pleural effusion.

Upper Abdomen: The liver shows diffusely decreased attenuation
suggesting fat deposition.

Musculoskeletal: No worrisome lytic or sclerotic osseous
abnormality. Old posterior right rib fractures evident. Compression
deformity noted at T5, T6, and T8.
IMPRESSION: 1. Lung-RADS 2, benign appearance or behavior. Continue annual
screening with low-dose chest CT without contrast in 12 months.
2.  Emphysema (2PDXE-707.Q) and Aortic Atherosclerosis (2PDXE-170.0)

## 2022-05-27 ENCOUNTER — Ambulatory Visit: Payer: Medicare HMO | Admitting: Pulmonary Disease

## 2022-05-27 ENCOUNTER — Encounter: Payer: Self-pay | Admitting: Pulmonary Disease

## 2022-05-27 VITALS — BP 134/68 | HR 88 | Ht 61.75 in | Wt 124.4 lb

## 2022-05-27 DIAGNOSIS — J449 Chronic obstructive pulmonary disease, unspecified: Secondary | ICD-10-CM | POA: Diagnosis not present

## 2022-05-27 DIAGNOSIS — J439 Emphysema, unspecified: Secondary | ICD-10-CM

## 2022-05-27 DIAGNOSIS — G4736 Sleep related hypoventilation in conditions classified elsewhere: Secondary | ICD-10-CM

## 2022-05-27 MED ORDER — ALBUTEROL SULFATE HFA 108 (90 BASE) MCG/ACT IN AERS: 2.0000 | INHALATION_SPRAY | RESPIRATORY_TRACT | 5 refills | Status: AC | PRN

## 2022-05-27 NOTE — Progress Notes (Signed)
[facility]  Chief Complaint  Patient presents with   Follow-up    Breathing is about the same since last ov  Needs refill on albuterol inhaler Today is patients birthday!!     Constitutional:  BP 134/68   Pulse 88   Ht 5' 1.75" (1.568 m)   Wt 124 lb 6.4 oz (56.4 kg)   SpO2 90% Comment: ra  BMI 22.94 kg/m   Past Medical History:  HTN, CAD, Osteoporosis, COVID September 2022  Past Surgical History:  She  has a past surgical history that includes Tonsillectomy; Eye surgery; Mass excision (Right, 04/12/2017); and Full thickness skin graft (Left, 04/12/2017).  Brief Summary:  Beth [name] is a 72 y.o. female former smoker with emphysema and right middle lung scarring.      Subjective:   Low dose CT chest in February was stable.  She rides her stationary bike about 2.5 hours per day in 30 minute increments.  She doesn't always using her oxygen when exercising.  She does using oxygen at night.  Has sinus congestion at night.  Not having chest congestion, cough, wheeze, sputum, or leg swelling.  Physical Exam:   Appearance - well kempt   ENMT - no sinus tenderness, no oral exudate, no LAN, Mallampati 3 airway, no stridor  Respiratory - equal breath sounds bilaterally, no wheezing or rales  CV - s1s2 regular rate and rhythm, no murmurs  Ext - no clubbing, no edema  Skin - no rashes  Psych - normal mood and affect    Pulmonary testing:  PFT 11/09/17 >> FEV1 0.71 (35%), FEV1% 41, TLC 5.52 (119%), DLCO 48%, no BD A1AT 10/04/17 >> 108, MS PFT 11/09/17 >> FEV1 0.71 (35%), FEV1% 41, TLC 5.52 (119%), RV 3.71 (182%), DLCO 48, no BD Ambulatory oximetry RA 12/05/18 >> SpO2 88%  Chest Imaging:  CT chest 07/21/17 >> atherosclerosis, bullous emphysema, RML partial collapse LDCT chest 02/28/19 >> mild/mod emphysema, 2.8 RUL nodule new, fatty liver, atherosclerosis LDCT chest 02/29/20 >> Lung RADS 2 LDCT 04/24/21 >> Lung RADS 2 LDCT  chest 04/27/22 >> Lung RADS 2  Sleep Tests:  ONO with RA 02/06/19 >> test time 4 hrs 23 min.  Baseline SpO2 86%, low SpO2 74%.  Spent 3 hrs 30 min with SpO2 < 88%.  Cardiac Tests:    Social History:  She  reports that she quit smoking about 5 years ago. Her smoking use included cigarettes. She has never used smokeless tobacco. She reports that she does not drink alcohol and does not use drugs.  Family History:  Her family history includes Arthritis in her sister; Heart attack in her mother; Lung cancer in her father; Stroke in her mother.     Assessment/Plan:   COPD with emphysema. - continue trelegy 100 one puff daily - prn albuterol   Chronic respiratory failure with hypoxia. - goal SpO2 > 90% - 2 liters oxygen at night and as needed with activity   History of tobacco abuse. - f/u low dose CT chest in February 2025  Chronic rhinitis. - she can try nasal irrigation at night as needed   Time Spent Involved in Patient Care on Day of Examination:  26 minutes  Follow up:   Patient Instructions  Follow up in 6 months  Medication List:   Allergies as of 05/27/2022       Reactions   Cefzil [cefprozil] Other (See Comments)   Burning sensation in lower legs.  Zanaflex [tizanidine Hcl] Nausea And Vomiting        Medication List        Accurate as of May 27, 2022  9:59 AM. If you have any questions, ask your nurse or doctor.          acetaminophen 500 MG tablet Commonly known as: TYLENOL Take 500 mg by mouth every 6 (six) hours as needed for mild pain or moderate pain.   albuterol 108 (90 Base) MCG/ACT inhaler Commonly known as: VENTOLIN HFA Inhale 2 puffs into the lungs every 4 (four) hours as needed for wheezing or shortness of breath (cough, shortness of breath or wheezing.).   alendronate 70 MG tablet Commonly known as: FOSAMAX Take 1 tablet by mouth once a week   aspirin EC 81 MG tablet Take 1 tablet (81 mg total) by mouth daily.    hydrochlorothiazide 25 MG tablet Commonly known as: HYDRODIURIL Take 1 tablet by mouth once daily   naproxen sodium 220 MG tablet Commonly known as: ALEVE Take 220 mg by mouth 2 (two) times daily as needed (for pain).   rosuvastatin 10 MG tablet Commonly known as: CRESTOR Take 1 tablet by mouth once daily   Trelegy Ellipta 100-62.5-25 MCG/ACT Aepb Generic drug: Fluticasone-Umeclidin-Vilant INHALE 1 PUFF INTO THE LUNGS DAILY.        Signature:  Chesley Mires, MD [place] Pager - [phone] 05/27/2022, 9:59 AM

## 2022-05-27 NOTE — Patient Instructions (Signed)
Follow up in 6 months 

## 2022-07-21 ENCOUNTER — Ambulatory Visit
Admission: RE | Admit: 2022-07-21 | Discharge: 2022-07-21 | Disposition: A | Discharge status: Home / Self Care | Payer: Medicare HMO | Source: Ambulatory Visit | Attending: Nurse Practitioner | Admitting: Nurse Practitioner

## 2022-07-21 VITALS — BP 164/61 | HR 99 | Temp 98.7°F | Resp 15

## 2022-07-21 DIAGNOSIS — J039 Acute tonsillitis, unspecified: Secondary | ICD-10-CM | POA: Insufficient documentation

## 2022-07-21 DIAGNOSIS — J029 Acute pharyngitis, unspecified: Secondary | ICD-10-CM | POA: Diagnosis not present

## 2022-07-21 LAB — POCT RAPID STREP A (OFFICE): Rapid Strep A Screen: NEGATIVE

## 2022-07-21 MED ORDER — NYSTATIN 100000 UNIT/ML MT SUSP: 500000.0000 [IU] | Freq: Four times a day (QID) | OROMUCOSAL | 0 refills | Status: DC

## 2022-07-21 NOTE — ED Triage Notes (Signed)
Pt c/o sore throat x 2 weeks, mostly dry causing throat to be sore.

## 2022-07-21 NOTE — ED Provider Notes (Signed)
[facility]    CSN: [id number] Arrival date & time: 07/21/22  0949      History   Chief Complaint Chief Complaint  Patient presents with   Sore Throat    Dry mouth, white spots - Entered by patient    HPI Beth [name] is a 72 y.o. female.   Patient presents today with sore throat for the past 2 weeks.  She describes it as a "dry" throat.  Patient denies fever, body aches or chills, new cough, shortness of breath or chest pain, headache, ear pain, abdominal pain, nausea/vomiting, and diarrhea.  No decreased appetite or fatigue.  Reports she typically wakes up with a congested cough and nasal congestion that improves after she coughs up some mucus.  Reports that she uses oxygen overnight as recommended by her pulmonologist and she thinks the dry throat is coming from the oxygen.  No known sick contacts or contacts with strep throat that she knows of.  Patient reports history of strep throat approximately 5 months ago.  Also reports history of thrush in the past, does use Breztri inhaler.  Patient denies antibiotic use in the past 90 days.    Past Medical History:  Diagnosis Date   Age-related osteoporosis with current pathological fracture 03/11/2017   DEXA bone scan at [facility] 03/29/2017 T score -3.1 at Rt total femur   COPD (chronic obstructive pulmonary disease) (HCC)    Coronary atherosclerosis of native coronary artery 07/22/2017   Seen on chest Ct 07/21/17. Aortic and branch vessel atherosclerosis. Normal heart size, without pericardial effusion. Multivessel coronary artery atherosclerosis.   Hypertension    Non-traumatic compression fracture of vertebral column (HCC) 02/18/2017   Chest CT 07/21/17: Moderate T5 and moderate to severe T6 compression deformities w/o significant canal encroachment. Moderate T8 compression deformity is also w/o canal encroachment   Squamous cell cancer of external ear, right 03/2017   right, excison by Dr. Suszanne Conners 04/2017     Patient Active Problem List   Diagnosis Date Noted   IGT (impaired glucose tolerance) 02/19/2022   COVID-19 virus infection 11/17/2020   Acute on chronic respiratory failure with hypoxia (HCC) 11/17/2020   Hyponatremia 11/17/2020   Essential hypertension 04/04/2020   Chronic respiratory failure with hypoxia (HCC) 03/20/2020   Abnormality of left breast on screening mammogram 01/23/2018   History of tobacco use 07/22/2017   Coronary atherosclerosis of native coronary artery 07/22/2017   Collapse of right lung 07/22/2017   Age-related osteoporosis with current pathological fracture 03/11/2017   Chronic obstructive pulmonary disease (HCC) 03/11/2017   Acute bilateral thoracic back pain 03/11/2017   White coat syndrome with high blood pressure but without hypertension 03/11/2017    Past Surgical History:  Procedure Laterality Date   EYE SURGERY     MASS EXCISION Right 04/12/2017   Procedure: EXCISION OF RIGHT EAR  MASS;  Surgeon: Newman Pies, MD;  Location: [facility];  Service: ENT;  Laterality: Right; squamous cell cancer with clear margins   SKIN FULL THICKNESS GRAFT Left 04/12/2017   Procedure: SKIN GRAFT FULL THICKNESS FROM ABDOMEN TO RIGHT EAR;  Surgeon: Newman Pies, MD;  Location: [facility];  Service: ENT;  Laterality: Right   TONSILLECTOMY      OB History   No obstetric history on file.      Home Medications    Prior to Admission medications   Medication Sig Start Date End Date Taking? Authorizing Provider  nystatin (MYCOSTATIN) 100000 UNIT/ML suspension Take  5 mLs (500,000 Units total) by mouth 4 (four) times daily. 07/21/22  Yes Valentino Nose, NP  acetaminophen (TYLENOL) 500 MG tablet Take 500 mg by mouth every 6 (six) hours as needed for mild pain or moderate pain.    [provider]  albuterol (VENTOLIN HFA) 108 (90 Base) MCG/ACT inhaler Inhale 2 puffs into the lungs every 4 (four) hours as needed for wheezing or shortness of  breath (cough, shortness of breath or wheezing.). 05/27/22   Coralyn Helling, MD  alendronate (FOSAMAX) 70 MG tablet Take 1 tablet by mouth once a week 04/06/22   Philip Aspen, Limmie Patricia, MD  aspirin EC 81 MG tablet Take 1 tablet (81 mg total) by mouth daily. 11/30/18   Doristine Bosworth, MD  hydrochlorothiazide (HYDRODIURIL) 25 MG tablet Take 1 tablet by mouth once daily 03/17/22   Philip Aspen, Limmie Patricia, MD  naproxen sodium (ALEVE) 220 MG tablet Take 220 mg by mouth 2 (two) times daily as needed (for pain).    [provider]  rosuvastatin (CRESTOR) 10 MG tablet Take 1 tablet by mouth once daily 03/17/22   Philip Aspen, Limmie Patricia, MD  TRELEGY ELLIPTA 100-62.5-25 MCG/ACT AEPB INHALE 1 PUFF INTO THE LUNGS DAILY. 12/29/21   Coralyn Helling, MD    Family History Family History  Problem Relation Age of Onset   Heart attack Mother    Stroke Mother    Lung cancer Father    Arthritis Sister     Social History Social History   Tobacco Use   Smoking status: Former    Types: Cigarettes    Quit date: 02/03/2017    Years since quitting: 5.4   Smokeless tobacco: Never  Vaping Use   Vaping Use: Never used  Substance Use Topics   Alcohol use: No   Drug use: No     Allergies   Cefzil [cefprozil] and Zanaflex [tizanidine hcl]   Review of Systems Review of Systems Per HPI  Physical Exam Triage Vital Signs ED Triage Vitals  Enc Vitals Group     BP 07/21/22 1032 (!) 164/61     Pulse Rate 07/21/22 1032 99     Resp 07/21/22 1032 15     Temp 07/21/22 1032 98.7 F (37.1 C)     Temp Source 07/21/22 1032 Oral     SpO2 07/21/22 1032 91 %     Weight --      Height --      Head Circumference --      Peak Flow --      Pain Score 07/21/22 1033 4     Pain Loc --      Pain Edu? --      Excl. in GC? --    No data found.  Updated Vital Signs BP (!) 164/61 (BP Location: Right Arm)   Pulse 99   Temp 98.7 F (37.1 C) (Oral)   Resp 15   SpO2 91%   Visual Acuity Right Eye  Distance:   Left Eye Distance:   Bilateral Distance:    Right Eye Near:   Left Eye Near:    Bilateral Near:     Physical Exam Vitals and nursing note reviewed.  Constitutional:      General: She is not in acute distress.    Appearance: She is well-developed. She is not toxic-appearing.  HENT:     Head: Normocephalic and atraumatic.     Right Ear: Tympanic membrane and ear canal normal. No drainage, swelling  or tenderness. No middle ear effusion. Tympanic membrane is not erythematous.     Left Ear: Tympanic membrane and ear canal normal. No drainage, swelling or tenderness.  No middle ear effusion. Tympanic membrane is not erythematous.     Nose: No congestion or rhinorrhea.     Mouth/Throat:     Mouth: Mucous membranes are moist. Oral lesions present.     Pharynx: Uvula midline. Oropharyngeal exudate present. No posterior oropharyngeal erythema.     Tonsils: Tonsillar exudate present. 1+ on the right. 1+ on the left.  Eyes:     Extraocular Movements:     Right eye: Normal extraocular motion.     Left eye: Normal extraocular motion.  Cardiovascular:     Rate and Rhythm: Normal rate and regular rhythm.  Pulmonary:     Effort: Pulmonary effort is normal. No respiratory distress.     Breath sounds: Normal breath sounds. No wheezing, rhonchi or rales.  Abdominal:     General: Bowel sounds are normal. There is no distension.     Palpations: Abdomen is soft.     Tenderness: There is no abdominal tenderness. There is no guarding or rebound.  Musculoskeletal:     Cervical back: Normal range of motion.  Lymphadenopathy:     Cervical: Cervical adenopathy present.  Skin:    General: Skin is warm and dry.     Capillary Refill: Capillary refill takes less than 2 seconds.     Coloration: Skin is not pale.     Findings: No erythema or rash.  Neurological:     Mental Status: She is alert and oriented to person, place, and time.  Psychiatric:        Behavior: Behavior is cooperative.       UC Treatments / Results  Labs (all labs ordered are listed, but only abnormal results are displayed) Labs Reviewed  CULTURE, GROUP A STREP [facility])  POCT RAPID STREP A (OFFICE)    EKG   Radiology No results found.  Procedures Procedures (including critical care time)  Medications Ordered in UC Medications - No data to display  Initial Impression / Assessment and Plan / UC Course  I have reviewed the triage vital signs and the nursing notes.  Pertinent labs & imaging results that were available during my care of the patient were reviewed by me and considered in my medical decision making (see chart for details).   Patient is well-appearing, afebrile, not tachycardic, not tachypneic, oxygenating well on room air.  Patient is mildly hypertensive today.  1. Acute pharyngitis, unspecified etiology 2. Acute tonsillitis, unspecified etiology Rapid strep test is negative Centor score today is 1 Throat culture is pending Will treat for thrush with nystatin rinses while throat culture is pending Strict ER and return precautions discussed with patient  The patient was given the opportunity to ask questions.  All questions answered to their satisfaction.  The patient is in agreement to this plan.    Final Clinical Impressions(s) / UC Diagnoses   Final diagnoses:  Acute pharyngitis, unspecified etiology  Acute tonsillitis, unspecified etiology     Discharge Instructions      The rapid strep throat test today is negative.  The throat culture is pending; we will call you around Thursday if that shows there is bacteria that requires treatment.  In the meantime, start the nystatin rinses to treat possible thrush.  Seek care for persistent or worsening symptoms despite treatment.     ED Prescriptions  Medication Sig Dispense Auth. Provider   nystatin (MYCOSTATIN) 100000 UNIT/ML suspension Take 5 mLs (500,000 Units total) by mouth 4 (four) times daily. 473 mL Valentino Nose, NP      PDMP not reviewed this encounter.   Valentino Nose, NP 07/21/22 1143

## 2022-07-21 NOTE — Discharge Instructions (Signed)
The rapid strep throat test today is negative.  The throat culture is pending; we will call you around Thursday if that shows there is bacteria that requires treatment.  In the meantime, start the nystatin rinses to treat possible thrush.  Seek care for persistent or worsening symptoms despite treatment.

## 2022-07-22 ENCOUNTER — Telehealth: Payer: Self-pay | Admitting: Emergency Medicine

## 2022-07-22 ENCOUNTER — Telehealth: Payer: Self-pay | Admitting: Nurse Practitioner

## 2022-07-22 MED ORDER — CLOTRIMAZOLE 10 MG MT TROC: 10.0000 mg | Freq: Every day | OROMUCOSAL | 0 refills | Status: AC

## 2022-07-22 NOTE — Telephone Encounter (Signed)
Clotrimazole troches sent to pharmacy as nystatin is on national backorder

## 2022-07-22 NOTE — Telephone Encounter (Signed)
Pt reported nystatin on backorder and would like alternative. Provider aware and electronically sent in prescription. Pt called and notified.

## 2022-07-24 LAB — CULTURE, GROUP A STREP (THRC)

## 2022-08-06 ENCOUNTER — Ambulatory Visit
Admission: RE | Admit: 2022-08-06 | Discharge: 2022-08-06 | Disposition: A | Discharge status: Home / Self Care | Payer: Medicare HMO | Source: Ambulatory Visit | Attending: Internal Medicine | Admitting: Internal Medicine

## 2022-08-06 DIAGNOSIS — Z8781 Personal history of (healed) traumatic fracture: Secondary | ICD-10-CM | POA: Diagnosis not present

## 2022-08-06 DIAGNOSIS — Z13820 Encounter for screening for osteoporosis: Secondary | ICD-10-CM

## 2022-08-06 DIAGNOSIS — Z79899 Other long term (current) drug therapy: Secondary | ICD-10-CM | POA: Diagnosis not present

## 2022-08-06 DIAGNOSIS — M810 Age-related osteoporosis without current pathological fracture: Secondary | ICD-10-CM | POA: Diagnosis not present

## 2022-08-06 DIAGNOSIS — E349 Endocrine disorder, unspecified: Secondary | ICD-10-CM | POA: Diagnosis not present

## 2022-08-13 ENCOUNTER — Encounter: Payer: Self-pay | Admitting: Internal Medicine

## 2022-08-13 ENCOUNTER — Ambulatory Visit (INDEPENDENT_AMBULATORY_CARE_PROVIDER_SITE_OTHER): Payer: Medicare HMO | Admitting: Internal Medicine

## 2022-08-13 VITALS — BP 130/80 | HR 84 | Temp 98.1°F | Wt 125.9 lb

## 2022-08-13 DIAGNOSIS — J439 Emphysema, unspecified: Secondary | ICD-10-CM

## 2022-08-13 DIAGNOSIS — M8000XD Age-related osteoporosis with current pathological fracture, unspecified site, subsequent encounter for fracture with routine healing: Secondary | ICD-10-CM

## 2022-08-13 DIAGNOSIS — J392 Other diseases of pharynx: Secondary | ICD-10-CM

## 2022-08-13 DIAGNOSIS — R7302 Impaired glucose tolerance (oral): Secondary | ICD-10-CM | POA: Diagnosis not present

## 2022-08-13 LAB — POCT GLYCOSYLATED HEMOGLOBIN (HGB A1C): Hemoglobin A1C: 5.7 % — AB (ref 4.0–5.6)

## 2022-08-13 NOTE — Assessment & Plan Note (Signed)
Stable, followed routinely by Dr. Craige Cotta, pulmonary.

## 2022-08-13 NOTE — Assessment & Plan Note (Signed)
Recent DEXA scan with improvement in T-scores, continue weekly alendronate.

## 2022-08-13 NOTE — Assessment & Plan Note (Signed)
Stable to slightly improved with an A1c of 5.7 today.  We have discussed continued lifestyle changes today.

## 2022-08-13 NOTE — Progress Notes (Signed)
Established Patient Office Visit     CC/Reason for Visit: Follow-up chronic conditions  HPI: Beth [name] is a 72 y.o. female who is coming in today for the above mentioned reasons. Past Medical History is significant for: Hypertension, hyperlipidemia, impaired glucose tolerance, osteoporosis, COPD on nightly oxygen.  She has been complaining of dry throat for some time.  We thought that by humidifying her oxygen it would improve but it has not.  She went to urgent care on 5/14 for the same and had rapid strep that was negative, no signs of thrush but she was prescribed nystatin which has not improved the dry throat.  She is otherwise feeling well.  She has been taking a daily over-the-counter antihistamine now for months.   Past Medical/Surgical History: Past Medical History:  Diagnosis Date   Age-related osteoporosis with current pathological fracture 03/11/2017   DEXA bone scan at [facility] 03/29/2017 T score -3.1 at Rt total femur   COPD (chronic obstructive pulmonary disease) (HCC)    Coronary atherosclerosis of native coronary artery 07/22/2017   Seen on chest Ct 07/21/17. Aortic and branch vessel atherosclerosis. Normal heart size, without pericardial effusion. Multivessel coronary artery atherosclerosis.   Hypertension    Non-traumatic compression fracture of vertebral column (HCC) 02/18/2017   Chest CT 07/21/17: Moderate T5 and moderate to severe T6 compression deformities w/o significant canal encroachment. Moderate T8 compression deformity is also w/o canal encroachment   Squamous cell cancer of external ear, right 03/2017   right, excison by Dr. Suszanne Conners 04/2017    Past Surgical History:  Procedure Laterality Date   EYE SURGERY     MASS EXCISION Right 04/12/2017   Procedure: EXCISION OF RIGHT EAR  MASS;  Surgeon: Newman Pies, MD;  Location: [facility];  Service: ENT;  Laterality: Right; squamous cell cancer with clear margins   SKIN FULL THICKNESS GRAFT  Left 04/12/2017   Procedure: SKIN GRAFT FULL THICKNESS FROM ABDOMEN TO RIGHT EAR;  Surgeon: Newman Pies, MD;  Location: [facility];  Service: ENT;  Laterality: Right   TONSILLECTOMY      Social History:  reports that she quit smoking about 5 years ago. Her smoking use included cigarettes. She has never used smokeless tobacco. She reports that she does not drink alcohol and does not use drugs.  Allergies: Allergies  Allergen Reactions   Cefzil [Cefprozil] Other (See Comments)    Burning sensation in lower legs.    Zanaflex [Tizanidine Hcl] Nausea And Vomiting    Family History:  Family History  Problem Relation Age of Onset   Heart attack Mother    Stroke Mother    Lung cancer Father    Arthritis Sister      Current Outpatient Medications:    acetaminophen (TYLENOL) 500 MG tablet, Take 500 mg by mouth every 6 (six) hours as needed for mild pain or moderate pain., Disp: , Rfl:    albuterol (VENTOLIN HFA) 108 (90 Base) MCG/ACT inhaler, Inhale 2 puffs into the lungs every 4 (four) hours as needed for wheezing or shortness of breath (cough, shortness of breath or wheezing.)., Disp: 8 g, Rfl: 5   alendronate (FOSAMAX) 70 MG tablet, Take 1 tablet by mouth once a week, Disp: 12 tablet, Rfl: 1   aspirin EC 81 MG tablet, Take 1 tablet (81 mg total) by mouth daily., Disp: 30 tablet, Rfl: 11   hydrochlorothiazide (HYDRODIURIL) 25 MG tablet, Take 1 tablet by mouth once daily, Disp: 90  tablet, Rfl: 1   naproxen sodium (ALEVE) 220 MG tablet, Take 220 mg by mouth 2 (two) times daily as needed (for pain)., Disp: , Rfl:    rosuvastatin (CRESTOR) 10 MG tablet, Take 1 tablet by mouth once daily, Disp: 90 tablet, Rfl: 1   TRELEGY ELLIPTA 100-62.5-25 MCG/ACT AEPB, INHALE 1 PUFF INTO THE LUNGS DAILY., Disp: 180 each, Rfl: 10  Review of Systems:  Negative unless indicated in HPI.   Physical Exam: Vitals:   08/13/22 0849  BP: 130/80  Pulse: 84  Temp: 98.1 F (36.7 C)  TempSrc: Oral   SpO2: 93%  Weight: 125 lb 14.4 oz (57.1 kg)    Body mass index is 23.21 kg/m.   Physical Exam Vitals reviewed.  Constitutional:      Appearance: Normal appearance.  HENT:     Head: Normocephalic and atraumatic.  Eyes:     Conjunctiva/sclera: Conjunctivae normal.     Pupils: Pupils are equal, round, and reactive to light.  Cardiovascular:     Rate and Rhythm: Normal rate and regular rhythm.  Pulmonary:     Effort: Pulmonary effort is normal.     Breath sounds: Normal breath sounds.  Skin:    General: Skin is warm and dry.  Neurological:     General: No focal deficit present.     Mental Status: She is alert and oriented to person, place, and time.  Psychiatric:        Mood and Affect: Mood normal.        Behavior: Behavior normal.        Thought Content: Thought content normal.        Judgment: Judgment normal.      Impression and Plan:  IGT (impaired glucose tolerance) Assessment & Plan: Stable to slightly improved with an A1c of 5.7 today.  We have discussed continued lifestyle changes today.  Orders: -     POCT glycosylated hemoglobin (Hb A1C)  Age-related osteoporosis with current pathological fracture with routine healing, subsequent encounter Assessment & Plan: Recent DEXA scan with improvement in T-scores, continue weekly alendronate.   Pulmonary emphysema, unspecified emphysema type (HCC) Assessment & Plan: Stable, followed routinely by Dr. Craige Cotta, pulmonary.   Dry throat  -Suspect related to daily use of antihistamine.  She will discontinue and see if this improves.   Time spent:33 minutes reviewing chart, interviewing and examining patient and formulating plan of care.     Chaya Jan, MD [facility]

## 2022-09-14 ENCOUNTER — Other Ambulatory Visit: Payer: Self-pay | Admitting: Internal Medicine

## 2022-09-14 DIAGNOSIS — M810 Age-related osteoporosis without current pathological fracture: Secondary | ICD-10-CM

## 2022-09-14 DIAGNOSIS — R030 Elevated blood-pressure reading, without diagnosis of hypertension: Secondary | ICD-10-CM

## 2022-09-14 DIAGNOSIS — I2510 Atherosclerotic heart disease of native coronary artery without angina pectoris: Secondary | ICD-10-CM

## 2022-09-15 DIAGNOSIS — Z85828 Personal history of other malignant neoplasm of skin: Secondary | ICD-10-CM | POA: Diagnosis not present

## 2022-09-15 DIAGNOSIS — D225 Melanocytic nevi of trunk: Secondary | ICD-10-CM | POA: Diagnosis not present

## 2022-09-15 DIAGNOSIS — L821 Other seborrheic keratosis: Secondary | ICD-10-CM | POA: Diagnosis not present

## 2022-09-15 DIAGNOSIS — L814 Other melanin hyperpigmentation: Secondary | ICD-10-CM | POA: Diagnosis not present

## 2022-09-15 DIAGNOSIS — Z08 Encounter for follow-up examination after completed treatment for malignant neoplasm: Secondary | ICD-10-CM | POA: Diagnosis not present

## 2022-11-05 DIAGNOSIS — H01002 Unspecified blepharitis right lower eyelid: Secondary | ICD-10-CM | POA: Diagnosis not present

## 2022-11-05 DIAGNOSIS — H2511 Age-related nuclear cataract, right eye: Secondary | ICD-10-CM | POA: Diagnosis not present

## 2022-11-05 DIAGNOSIS — H2513 Age-related nuclear cataract, bilateral: Secondary | ICD-10-CM | POA: Diagnosis not present

## 2022-11-05 DIAGNOSIS — H01001 Unspecified blepharitis right upper eyelid: Secondary | ICD-10-CM | POA: Diagnosis not present

## 2022-11-05 DIAGNOSIS — H353131 Nonexudative age-related macular degeneration, bilateral, early dry stage: Secondary | ICD-10-CM | POA: Diagnosis not present

## 2022-11-11 ENCOUNTER — Encounter (HOSPITAL_COMMUNITY)
Admission: RE | Admit: 2022-11-11 | Discharge: 2022-11-11 | Disposition: A | Discharge status: Home / Self Care | Payer: Medicare HMO | Source: Ambulatory Visit | Attending: Ophthalmology | Admitting: Ophthalmology

## 2022-11-11 NOTE — H&P (Signed)
Surgical History & Physical  Patient Name: Beth [name]  DOB: Jul 18, 1950  Surgery: Cataract extraction with intraocular lens implant phacoemulsification; Right Eye Surgeon: Fabio Pierce MD Surgery Date: 11/13/2022 Pre-Op Date: 11/05/2022  HPI: A 38 Yr. old female patient present for cataract eval per Dr. Charise Killian. 1. The patient complains of sunlight glare causing poor vision, difficulties reading fine print like on medicine bottles. Her vision has gradually worsened over time OD>OS. The symptoms are constant. This is negatively affecting the patient's quality of life and the patient is unable to function adequately in life with the current level of vision. HPI Completed by Dr. Fabio Pierce.  Medical History: Cataracts  Arthritis Cancer High Blood Pressure LDL Lung Problems  Review of Systems Cardiovascular High Blood Pressure Musculoskeletal arthritis pains Respiratory Emphysema All recorded systems are negative except as noted above  Social Former smoker   Medications Systane,  alendronate ,  hydrochlorothiazide ,  rosuvastatin ,  Trelegy Ellipta   Sx/Procedures Mohs on nose, Skin cancer on ear  Drug Allergies  Cefzil, Zanaflex  History & Physical: Heent: cataracts NECK: supple without bruits LUNGS: lungs clear to auscultation CV: regular rate and rhythm Abdomen: soft and non-tender  Impression & Plan: Assessment: 1.  NUCLEAR SCLEROSIS AGE RELATED; Both Eyes (H25.13) 2.  Hyperopia ; Both Eyes (H52.03) 3.  Age-related Macular Degeneration, DRY; Both Eyes Early (H35.3131) 4.  BLEPHARITIS; Right Upper Lid, Right Lower Lid, Left Upper Lid, Left Lower Lid (H01.001, H01.002,H01.004,H01.005) 5.  ASTIGMATISM, REGULAR; Both Eyes (H52.223)  Plan: 1.  Cataract accounts for the patient's decreased vision. This visual impairment is not correctable with a tolerable change in glasses or contact lenses. Cataract surgery with an implantation of a new lens should significantly  improve the visual and functional status of the patient. Discussed all risks, benefits, alternatives, and potential complications. Discussed the procedures and recovery. Patient desires to have surgery. A-scan ordered and performed today for intra-ocular lens calculations. The surgery will be performed in order to improve vision for driving, reading, and for eye examinations. Recommend phacoemulsification with intra-ocular lens. Recommend Dextenza for post-operative pain and inflammation. Right Eye. Dilates poorly - shugarcaine by protocol. Malyugin Ring. Omidira. Recommend Eyhance Toric Lens.  2. Continue care with Dr. Charise Killian.  3.  Mild changes. OCT macula today shows no fluid. Amsler grid testing weekly. Call with any worsening vision, pain, or any other concerns.  4.  Blepharitis is present - recommend regular lid cleaning.  5.  Recommend Toric IOL OU.

## 2022-11-13 ENCOUNTER — Ambulatory Visit (HOSPITAL_COMMUNITY)
Admission: RE | Admit: 2022-11-13 | Discharge: 2022-11-13 | Disposition: A | Discharge status: Home / Self Care | Payer: Medicare HMO | Attending: Ophthalmology | Admitting: Ophthalmology

## 2022-11-13 ENCOUNTER — Encounter (HOSPITAL_COMMUNITY)
Admission: RE | Disposition: A | Discharge status: Home / Self Care | Payer: Self-pay | Source: Home / Self Care | Attending: Ophthalmology

## 2022-11-13 ENCOUNTER — Ambulatory Visit (HOSPITAL_COMMUNITY): Payer: Medicare HMO | Admitting: Anesthesiology

## 2022-11-13 ENCOUNTER — Encounter (HOSPITAL_COMMUNITY): Payer: Self-pay | Admitting: Ophthalmology

## 2022-11-13 DIAGNOSIS — H2511 Age-related nuclear cataract, right eye: Secondary | ICD-10-CM | POA: Diagnosis not present

## 2022-11-13 DIAGNOSIS — H5711 Ocular pain, right eye: Secondary | ICD-10-CM | POA: Diagnosis not present

## 2022-11-13 DIAGNOSIS — Z9981 Dependence on supplemental oxygen: Secondary | ICD-10-CM | POA: Insufficient documentation

## 2022-11-13 DIAGNOSIS — I2510 Atherosclerotic heart disease of native coronary artery without angina pectoris: Secondary | ICD-10-CM | POA: Insufficient documentation

## 2022-11-13 DIAGNOSIS — Z87891 Personal history of nicotine dependence: Secondary | ICD-10-CM

## 2022-11-13 DIAGNOSIS — J449 Chronic obstructive pulmonary disease, unspecified: Secondary | ICD-10-CM | POA: Diagnosis not present

## 2022-11-13 DIAGNOSIS — H52223 Regular astigmatism, bilateral: Secondary | ICD-10-CM | POA: Diagnosis not present

## 2022-11-13 DIAGNOSIS — H2513 Age-related nuclear cataract, bilateral: Secondary | ICD-10-CM | POA: Insufficient documentation

## 2022-11-13 DIAGNOSIS — I10 Essential (primary) hypertension: Secondary | ICD-10-CM | POA: Insufficient documentation

## 2022-11-13 DIAGNOSIS — H0100B Unspecified blepharitis left eye, upper and lower eyelids: Secondary | ICD-10-CM | POA: Insufficient documentation

## 2022-11-13 DIAGNOSIS — Z79899 Other long term (current) drug therapy: Secondary | ICD-10-CM | POA: Diagnosis not present

## 2022-11-13 DIAGNOSIS — H5203 Hypermetropia, bilateral: Secondary | ICD-10-CM | POA: Insufficient documentation

## 2022-11-13 DIAGNOSIS — H353131 Nonexudative age-related macular degeneration, bilateral, early dry stage: Secondary | ICD-10-CM | POA: Diagnosis not present

## 2022-11-13 DIAGNOSIS — H0100A Unspecified blepharitis right eye, upper and lower eyelids: Secondary | ICD-10-CM | POA: Insufficient documentation

## 2022-11-13 SURGERY — CATARACT EXTRACTION PHACO AND INTRAOCULAR LENS PLACEMENT (IOC) with placement of Corticosteroid
Anesthesia: Monitor Anesthesia Care | Site: Eye | Laterality: Right

## 2022-11-13 MED ORDER — EPINEPHRINE PF 1 MG/ML IJ SOLN
INTRAMUSCULAR | Status: AC
  Filled 2022-11-13: qty 1

## 2022-11-13 MED ORDER — TROPICAMIDE 1 % OP SOLN
1.0000 [drp] | OPHTHALMIC | Status: AC | PRN
  Administered 2022-11-13 (×3): 1 [drp] via OPHTHALMIC

## 2022-11-13 MED ORDER — TETRACAINE HCL 0.5 % OP SOLN
1.0000 [drp] | OPHTHALMIC | Status: AC | PRN
  Administered 2022-11-13 (×3): 1 [drp] via OPHTHALMIC

## 2022-11-13 MED ORDER — PHENYLEPHRINE HCL 2.5 % OP SOLN
1.0000 [drp] | OPHTHALMIC | Status: AC | PRN
  Administered 2022-11-13 (×3): 1 [drp] via OPHTHALMIC

## 2022-11-13 MED ORDER — MIDAZOLAM HCL 2 MG/2ML IJ SOLN
INTRAMUSCULAR | Status: AC
  Filled 2022-11-13: qty 2

## 2022-11-13 MED ORDER — SODIUM HYALURONATE 10 MG/ML IO SOLUTION
PREFILLED_SYRINGE | INTRAOCULAR | Status: DC | PRN
  Administered 2022-11-13: .85 mL via INTRAOCULAR

## 2022-11-13 MED ORDER — ORAL CARE MOUTH RINSE: 15.0000 mL | Freq: Once | OROMUCOSAL | Status: DC

## 2022-11-13 MED ORDER — LIDOCAINE HCL 3.5 % OP GEL
1.0000 | Freq: Once | OPHTHALMIC | Status: AC
  Administered 2022-11-13: 1 via OPHTHALMIC

## 2022-11-13 MED ORDER — BSS IO SOLN
INTRAOCULAR | Status: DC | PRN
  Administered 2022-11-13: 15 mL via INTRAOCULAR

## 2022-11-13 MED ORDER — LIDOCAINE HCL (PF) 1 % IJ SOLN
INTRAOCULAR | Status: DC | PRN
  Administered 2022-11-13: 1 mL via OPHTHALMIC

## 2022-11-13 MED ORDER — PHENYLEPHRINE-KETOROLAC 1-0.3 % IO SOLN
INTRAOCULAR | Status: AC
  Filled 2022-11-13: qty 4

## 2022-11-13 MED ORDER — SODIUM HYALURONATE 23MG/ML IO SOSY
PREFILLED_SYRINGE | INTRAOCULAR | Status: DC | PRN
  Administered 2022-11-13: .6 mL via INTRAOCULAR

## 2022-11-13 MED ORDER — EPINEPHRINE PF 1 MG/ML IJ SOLN
INTRAOCULAR | Status: DC | PRN
  Administered 2022-11-13: 500 mL

## 2022-11-13 MED ORDER — DEXAMETHASONE 0.4 MG OP INST
VAGINAL_INSERT | OPHTHALMIC | Status: AC
  Filled 2022-11-13: qty 1

## 2022-11-13 MED ORDER — MIDAZOLAM HCL 2 MG/2ML IJ SOLN
INTRAMUSCULAR | Status: DC | PRN
  Administered 2022-11-13: 1 mg via INTRAVENOUS

## 2022-11-13 MED ORDER — CHLORHEXIDINE GLUCONATE 0.12 % MT SOLN: 15.0000 mL | Freq: Once | OROMUCOSAL | Status: DC

## 2022-11-13 MED ORDER — POVIDONE-IODINE 5 % OP SOLN
OPHTHALMIC | Status: DC | PRN
  Administered 2022-11-13: 1 via OPHTHALMIC

## 2022-11-13 MED ORDER — STERILE WATER FOR IRRIGATION IR SOLN
Status: DC | PRN
  Administered 2022-11-13: 1000 mL

## 2022-11-13 MED ORDER — MOXIFLOXACIN HCL 5 MG/ML IO SOLN
INTRAOCULAR | Status: DC | PRN
  Administered 2022-11-13: .2 mL via INTRACAMERAL

## 2022-11-13 SURGICAL SUPPLY — 13 items
CATARACT SUITE SIGHTPATH (MISCELLANEOUS) ×1
CLOTH BEACON ORANGE TIMEOUT ST (SAFETY) ×1 IMPLANT
EYE SHIELD UNIVERSAL CLEAR (GAUZE/BANDAGES/DRESSINGS) IMPLANT
FEE CATARACT SUITE SIGHTPATH (MISCELLANEOUS) ×1 IMPLANT
GLOVE BIOGEL PI IND STRL 7.0 (GLOVE) ×2 IMPLANT
LENS IOL TECNIS EYHANCE 22.5 (Intraocular Lens) IMPLANT
NDL HYPO 18GX1.5 BLUNT FILL (NEEDLE) ×1 IMPLANT
NEEDLE HYPO 18GX1.5 BLUNT FILL (NEEDLE) ×1
PAD ARMBOARD 7.5X6 YLW CONV (MISCELLANEOUS) ×1 IMPLANT
POSITIONER HEAD 8X9X4 ADT (SOFTGOODS) ×1 IMPLANT
SYR TB 1ML LL NO SAFETY (SYRINGE) ×1 IMPLANT
TAPE SURG TRANSPORE 1 IN (GAUZE/BANDAGES/DRESSINGS) IMPLANT
WATER STERILE IRR 250ML POUR (IV SOLUTION) ×1 IMPLANT

## 2022-11-13 NOTE — Anesthesia Postprocedure Evaluation (Signed)
Anesthesia Post Note  Patient: [name]  Procedure(s) Performed: CATARACT EXTRACTION PHACO AND INTRAOCULAR LENS PLACEMENT (IOC) with placement of Corticosteroid (Right: Eye)  Patient location during evaluation: Phase II Anesthesia Type: MAC Level of consciousness: awake and alert and oriented Pain management: pain level controlled Vital Signs Assessment: post-procedure vital signs reviewed and stable Respiratory status: spontaneous breathing, nonlabored ventilation and respiratory function stable Cardiovascular status: stable and blood pressure returned to baseline Postop Assessment: no apparent nausea or vomiting Anesthetic complications: no  No notable events documented.   Last Vitals:  Vitals:   11/13/22 0736 11/13/22 0849  BP: (!) 165/69 (!) 140/68  Pulse: 77 75  Resp: 18   Temp: 36.8 C 36.9 C  SpO2: 96% 99%    Last Pain:  Vitals:   11/13/22 0849  TempSrc: Oral  PainSc: 0-No pain                 Drayson Dorko C Keajah Killough

## 2022-11-13 NOTE — Transfer of Care (Signed)
Immediate Anesthesia Transfer of Care Note  Patient: Zuzana Scheckel  Procedure(s) Performed: CATARACT EXTRACTION PHACO AND INTRAOCULAR LENS PLACEMENT (IOC) with placement of Corticosteroid (Right: Eye)  Patient Location: Short Stay  Anesthesia Type:MAC  Level of Consciousness: awake, alert , and oriented  Airway & Oxygen Therapy: Patient Spontanous Breathing  Post-op Assessment: Report given to RN and Post -op Vital signs reviewed and stable  Post vital signs: Reviewed and stable  Last Vitals:  Vitals Value Taken Time  BP 140/68 11/13/22 0849  Temp 36.9 C 11/13/22 0849  Pulse 75 11/13/22 0849  Resp 14   SpO2 99 % 11/13/22 0849    Last Pain:  Vitals:   11/13/22 0849  TempSrc: Oral  PainSc: 0-No pain      Patients Stated Pain Goal: 5 (11/13/22 0736)  Complications: No notable events documented.

## 2022-11-13 NOTE — Op Note (Signed)
Date of procedure: 11/13/22  Pre-operative diagnosis:  Visually significant nuclear age-related cataract, Right Eye (H25.11)  Post-operative diagnosis:   1. Visually significant nuclear age-related cataract, Right Eye (H25.11) 2. Pain and inflammation following cataract surgery Right Eye (H57.11)  Procedure:  Removal of cataract via phacoemulsification and insertion of intra-ocular lens [employer] DIB00 +25.5D into the capsular bag of the Right Eye 2. Placement of Dextenza insert, Right Eye  Attending surgeon: Rudy Jew. Tanija Germani, MD, MA  Anesthesia: MAC, Topical Akten  Complications: None  Estimated Blood Loss: <9mL (minimal)  Specimens: None  Implants: As above  Indications:  Visually significant age-related cataract, Right Eye  Procedure:  The patient was seen and identified in the pre-operative area. The operative eye was identified and dilated.  The operative eye was marked.  Topical anesthesia was administered to the operative eye.     The patient was then to the operative suite and placed in the supine position.  A timeout was performed confirming the patient, procedure to be performed, and all other relevant information.   The patient's face was prepped and draped in the usual fashion for intra-ocular surgery.  A lid speculum was placed into the operative eye and the surgical microscope moved into place and focused.  A superotemporal paracentesis was created using a 20 gauge paracentesis blade.  Shugarcaine was injected into the anterior chamber.  Viscoelastic was injected into the anterior chamber.  A temporal clear-corneal main wound incision was created using a 2.28mm microkeratome.  A continuous curvilinear capsulorrhexis was initiated using an irrigating cystitome and completed using capsulorrhexis forceps.  Hydrodissection and hydrodeliniation were performed.  Viscoelastic was injected into the anterior chamber.  A phacoemulsification handpiece and a chopper as a  second instrument were used to remove the nucleus and epinucleus. The irrigation/aspiration handpiece was used to remove any remaining cortical material.   The capsular bag was reinflated with viscoelastic, checked, and found to be intact.  The intraocular lens was inserted into the capsular bag.  The irrigation/aspiration handpiece was used to remove any remaining viscoelastic.  The clear corneal wound and paracentesis wounds were then hydrated and checked with Weck-Cels to be watertight. 0.41mL of moxifloxacin was injected into the anterior chamber.  The lid-speculum was removed. The lower punctum was dilated. A Dextenza implant was placed in the lower canaliculus without complication.  The drape was removed.  The patient's face was cleaned with a wet and dry 4x4. A clear shield was taped over the eye. The patient was taken to the post-operative care unit in good condition, having tolerated the procedure well.  Post-Op Instructions: The patient will follow up at [facility] for a same day post-operative evaluation and will receive all other orders and instructions.

## 2022-11-13 NOTE — Discharge Instructions (Addendum)
Please discharge patient when stable, will follow up today with Dr. Wrzosek at the [facility] office immediately following discharge.  Leave shield in place until visit.  All paperwork with discharge instructions will be given at the office.  [facility] Address:  [address]  [place], [place] [address]  

## 2022-11-13 NOTE — Anesthesia Preprocedure Evaluation (Signed)
Anesthesia Evaluation  Patient identified by MRN, date of birth, ID band Patient awake    Reviewed: Allergy & Precautions, H&P , NPO status , Patient's Chart, lab work & pertinent test results, reviewed documented beta blocker date and time   Airway Mallampati: II  TM Distance: >3 FB Neck ROM: Full    Dental  (+) Edentulous Upper, Edentulous Lower   Pulmonary COPD (oxygen during night),  COPD inhaler and oxygen dependent, former smoker   Pulmonary exam normal breath sounds clear to auscultation       Cardiovascular hypertension, Pt. on medications and Pt. on home beta blockers + CAD  Normal cardiovascular exam Rhythm:Regular Rate:Normal     Neuro/Psych negative neurological ROS  negative psych ROS   GI/Hepatic negative GI ROS, Neg liver ROS,,,  Endo/Other  negative endocrine ROS    Renal/GU negative Renal ROS  negative genitourinary   Musculoskeletal negative musculoskeletal ROS (+)    Abdominal   Peds negative pediatric ROS (+)  Hematology negative hematology ROS (+)   Anesthesia Other Findings Thoracic back pain  Reproductive/Obstetrics negative OB ROS                             Anesthesia Physical Anesthesia Plan  ASA: 3  Anesthesia Plan: MAC   Post-op Pain Management: Minimal or no pain anticipated   Induction: Intravenous  PONV Risk Score and Plan: 0 and Treatment may vary due to age or medical condition  Airway Management Planned: Nasal Cannula and Natural Airway  Additional Equipment:   Intra-op Plan:   Post-operative Plan:   Informed Consent: I have reviewed the patients History and Physical, chart, labs and discussed the procedure including the risks, benefits and alternatives for the proposed anesthesia with the patient or authorized representative who has indicated his/her understanding and acceptance.     Dental advisory given  Plan Discussed with: CRNA and  Surgeon  Anesthesia Plan Comments:         Anesthesia Quick Evaluation

## 2022-11-13 NOTE — Interval H&P Note (Signed)
History and Physical Interval Note:  11/13/2022 8:21 AM  Beth [name]  has presented today for surgery, with the diagnosis of age related nuclear cataract, right eye.  The various methods of treatment have been discussed with the patient and family. After consideration of risks, benefits and other options for treatment, the patient has consented to  Procedure(s): CATARACT EXTRACTION PHACO AND INTRAOCULAR LENS PLACEMENT (IOC) with placement of Corticosteroid (Right) as a surgical intervention.  The patient's history has been reviewed, patient examined, no change in status, stable for surgery.  I have reviewed the patient's chart and labs.  Questions were answered to the patient's satisfaction.     Fabio Pierce

## 2022-11-23 DIAGNOSIS — H2512 Age-related nuclear cataract, left eye: Secondary | ICD-10-CM | POA: Diagnosis not present

## 2022-11-25 ENCOUNTER — Ambulatory Visit (HOSPITAL_BASED_OUTPATIENT_CLINIC_OR_DEPARTMENT_OTHER): Payer: Medicare HMO | Admitting: Pulmonary Disease

## 2022-11-25 ENCOUNTER — Encounter (HOSPITAL_BASED_OUTPATIENT_CLINIC_OR_DEPARTMENT_OTHER): Payer: Self-pay | Admitting: Pulmonary Disease

## 2022-11-25 VITALS — BP 138/80 | HR 80 | Ht 61.75 in | Wt 126.0 lb

## 2022-11-25 DIAGNOSIS — Z23 Encounter for immunization: Secondary | ICD-10-CM | POA: Diagnosis not present

## 2022-11-25 DIAGNOSIS — J449 Chronic obstructive pulmonary disease, unspecified: Secondary | ICD-10-CM | POA: Diagnosis not present

## 2022-11-25 DIAGNOSIS — G4736 Sleep related hypoventilation in conditions classified elsewhere: Secondary | ICD-10-CM

## 2022-11-25 DIAGNOSIS — J439 Emphysema, unspecified: Secondary | ICD-10-CM

## 2022-11-25 NOTE — H&P (Signed)
Surgical History & Physical  Patient Name: Beth [name]  DOB: 05-08-1950  Surgery: Cataract extraction with intraocular lens implant phacoemulsification; Left Eye Surgeon: Fabio Pierce MD Surgery Date: 11/30/2022 Pre-Op Date: 11/19/2022  HPI: A 38 Yr. old female patient present for 6 day post op OD. Vision is still blurry. Otherwise, doing well. Using Combo TID OD. Patient also here for blurred vision in the left eye. Difficulties reading fine print, seeing writing on the TV, poor night vision, and glare during the day. This is negatively affecting the patient's quality of life and the patient is unable to function adequately in life with the current level of vision. Patient would like to proceed with cataract surgery. HPI Completed by Dr. Fabio Pierce  Medical History: Cataracts  Arthritis Cancer High Blood Pressure LDL Lung Problems  Review of Systems Cardiovascular High Blood Pressure Musculoskeletal arthritis pains Respiratory Emphysema All recorded systems are negative except as noted above.  Social Former smoker   Medication Systane, Prednisolone-moxiflox-bromfen,  alendronate ,  hydrochlorothiazide ,  rosuvastatin ,  Trelegy Ellipta   Sx/Procedures Phaco c IOL OD with Dextenza,  Mohs on nose, Skin cancer on ear  Drug Allergies  Zanaflex ,  Cefzil    History & Physical: Heent: cataracts NECK: supple without bruits LUNGS: lungs clear to auscultation CV: regular rate and rhythm Abdomen: soft and non-tender  Impression & Plan: Assessment: 1.  CATARACT EXTRACTION STATUS; Right Eye (Z98.41) 2.  NUCLEAR SCLEROSIS AGE RELATED; Left Eye (H25.12) 3.  INTRAOCULAR LENS IOL ; Right Eye (Z96.1)  Plan: 1.  6 days after cataract surgery. Doing well with improved vision and normal eye pressure. Call with any problems or concerns. Stop drops - Dextenza. Call with any concerning symptoms.  2.  Cataract accounts for the patient's decreased vision. This visual impairment  is not correctable with a tolerable change in glasses or contact lenses. Cataract surgery with an implantation of a new lens should significantly improve the visual and functional status of the patient. Discussed all risks, benefits, alternatives, and potential complications. Discussed the procedures and recovery. Patient desires to have surgery. A-scan ordered and performed today for intra-ocular lens calculations. The surgery will be performed in order to improve vision for driving, reading, and for eye examinations. Recommend phacoemulsification with intra-ocular lens. Recommend Dextenza for post-operative pain and inflammation. Left Eye. Surgery required to correct imbalance of vision. Dilates well - shugarcaine by protocol.  3.  Doing well since surgery

## 2022-11-25 NOTE — Progress Notes (Signed)
[facility]  Chief Complaint  Patient presents with   Emphysema    Constitutional:  BP 138/80   Pulse 80   Ht 5' 1.75" (1.568 m)   Wt 126 lb (57.2 kg)   SpO2 93%   BMI 23.23 kg/m   Past Medical History:  HTN, CAD, Osteoporosis, COVID September 2022  Past Surgical History:  She  has a past surgical history that includes Tonsillectomy; Eye surgery; Mass excision (Right, 04/12/2017); and Full thickness skin graft (Left, 04/12/2017).  Brief Summary:  Beth [name] is a 72 y.o. female former smoker with emphysema and right middle lung scarring.      Subjective:   Breathing has been okay.  Not having much cough.  Not having wheeze.  Sleeping okay.  Uses 2 liters oxygen at night.  SpO2 stays above 90% during the day.  Uses albuterol a couple times per week.  Physical Exam:   Appearance - well kempt   ENMT - no sinus tenderness, no oral exudate, no LAN, Mallampati 3 airway, no stridor  Respiratory - equal breath sounds bilaterally, no wheezing or rales  CV - s1s2 regular rate and rhythm, no murmurs  Ext - no clubbing, no edema  Skin - no rashes  Psych - normal mood and affect    Pulmonary testing:  PFT 11/09/17 >> FEV1 0.71 (35%), FEV1% 41, TLC 5.52 (119%), DLCO 48%, no BD A1AT 10/04/17 >> 108, MS PFT 11/09/17 >> FEV1 0.71 (35%), FEV1% 41, TLC 5.52 (119%), RV 3.71 (182%), DLCO 48, no BD Ambulatory oximetry RA 12/05/18 >> SpO2 88%  Chest Imaging:  CT chest 07/21/17 >> atherosclerosis, bullous emphysema, RML partial collapse LDCT chest 02/28/19 >> mild/mod emphysema, 2.8 RUL nodule new, fatty liver, atherosclerosis LDCT chest 02/29/20 >> Lung RADS 2 LDCT 04/24/21 >> Lung RADS 2 LDCT chest 04/27/22 >> Lung RADS 2  Sleep Tests:  ONO with RA 02/06/19 >> test time 4 hrs 23 min.  Baseline SpO2 86%, low SpO2 74%.  Spent 3 hrs 30 min with SpO2 < 88%.  Cardiac Tests:    Social History:  She  reports that she quit smoking  about 5 years ago. Her smoking use included cigarettes. She has never used smokeless tobacco. She reports that she does not drink alcohol and does not use drugs.  Family History:  Her family history includes Arthritis in her sister; Heart attack in her mother; Lung cancer in her father; Stroke in her mother.     Assessment/Plan:   COPD with emphysema. - continue trelegy 100 one puff daily - prn albuterol - high dose flu shot today   Chronic respiratory failure with hypoxia. - goal SpO2 > 90% - 2 liters oxygen at night and as needed with activity   History of tobacco abuse. - f/u low dose CT chest in February 2025  Chronic rhinitis. - she can try nasal irrigation at night as needed   Time Spent Involved in Patient Care on Day of Examination:  25 minutes  Follow up:   Patient Instructions  Follow up in 6 months  Medication List:   Allergies as of 11/25/2022       Reactions   Cefzil [cefprozil] Other (See Comments)   Burning sensation in lower legs.    Zanaflex [tizanidine Hcl] Nausea And Vomiting        Medication List        Accurate as of November 25, 2022 11:02 AM. If you have any questions,  ask your nurse or doctor.          acetaminophen 500 MG tablet Commonly known as: TYLENOL Take 500 mg by mouth every 6 (six) hours as needed for mild pain or moderate pain.   albuterol 108 (90 Base) MCG/ACT inhaler Commonly known as: VENTOLIN HFA Inhale 2 puffs into the lungs every 4 (four) hours as needed for wheezing or shortness of breath (cough, shortness of breath or wheezing.).   alendronate 70 MG tablet Commonly known as: FOSAMAX Take 1 tablet by mouth once a week   aspirin EC 81 MG tablet Take 1 tablet (81 mg total) by mouth daily.   hydrochlorothiazide 25 MG tablet Commonly known as: HYDRODIURIL Take 1 tablet by mouth once daily   naproxen sodium 220 MG tablet Commonly known as: ALEVE Take 220 mg by mouth 2 (two) times daily as needed (for  pain).   rosuvastatin 10 MG tablet Commonly known as: CRESTOR Take 1 tablet by mouth once daily   Trelegy Ellipta 100-62.5-25 MCG/ACT Aepb Generic drug: Fluticasone-Umeclidin-Vilant INHALE 1 PUFF INTO THE LUNGS DAILY.        Signature:  Coralyn Helling, MD [facility] Pulmonary/Critical Care Pager - [phone] 11/25/2022, 11:02 AM

## 2022-11-25 NOTE — Patient Instructions (Signed)
Follow up in 6 months 

## 2022-11-26 ENCOUNTER — Encounter (HOSPITAL_COMMUNITY): Payer: Self-pay

## 2022-11-26 ENCOUNTER — Encounter (HOSPITAL_COMMUNITY)
Admit: 2022-11-26 | Discharge: 2022-11-26 | Disposition: A | Discharge status: Home / Self Care | Payer: Medicare HMO | Attending: Ophthalmology | Admitting: Ophthalmology

## 2022-11-30 ENCOUNTER — Encounter (HOSPITAL_COMMUNITY)
Admission: RE | Disposition: A | Discharge status: Home / Self Care | Payer: Self-pay | Source: Home / Self Care | Attending: Ophthalmology

## 2022-11-30 ENCOUNTER — Ambulatory Visit (HOSPITAL_BASED_OUTPATIENT_CLINIC_OR_DEPARTMENT_OTHER): Payer: Medicare HMO | Admitting: Certified Registered"

## 2022-11-30 ENCOUNTER — Ambulatory Visit (HOSPITAL_COMMUNITY)
Admission: RE | Admit: 2022-11-30 | Discharge: 2022-11-30 | Disposition: A | Discharge status: Home / Self Care | Payer: Medicare HMO | Attending: Ophthalmology | Admitting: Ophthalmology

## 2022-11-30 ENCOUNTER — Ambulatory Visit (HOSPITAL_COMMUNITY): Payer: Medicare HMO | Admitting: Certified Registered"

## 2022-11-30 ENCOUNTER — Encounter (HOSPITAL_COMMUNITY): Payer: Self-pay | Admitting: Ophthalmology

## 2022-11-30 DIAGNOSIS — H2512 Age-related nuclear cataract, left eye: Secondary | ICD-10-CM

## 2022-11-30 DIAGNOSIS — I2510 Atherosclerotic heart disease of native coronary artery without angina pectoris: Secondary | ICD-10-CM | POA: Insufficient documentation

## 2022-11-30 DIAGNOSIS — J439 Emphysema, unspecified: Secondary | ICD-10-CM | POA: Insufficient documentation

## 2022-11-30 DIAGNOSIS — Z87891 Personal history of nicotine dependence: Secondary | ICD-10-CM | POA: Insufficient documentation

## 2022-11-30 DIAGNOSIS — I10 Essential (primary) hypertension: Secondary | ICD-10-CM | POA: Insufficient documentation

## 2022-11-30 DIAGNOSIS — M810 Age-related osteoporosis without current pathological fracture: Secondary | ICD-10-CM | POA: Insufficient documentation

## 2022-11-30 DIAGNOSIS — Z9981 Dependence on supplemental oxygen: Secondary | ICD-10-CM | POA: Insufficient documentation

## 2022-11-30 DIAGNOSIS — J449 Chronic obstructive pulmonary disease, unspecified: Secondary | ICD-10-CM

## 2022-11-30 DIAGNOSIS — Z85828 Personal history of other malignant neoplasm of skin: Secondary | ICD-10-CM | POA: Diagnosis not present

## 2022-11-30 DIAGNOSIS — H5712 Ocular pain, left eye: Secondary | ICD-10-CM | POA: Diagnosis not present

## 2022-11-30 SURGERY — CATARACT EXTRACTION PHACO AND INTRAOCULAR LENS PLACEMENT (IOC) with placement of Corticosteroid
Anesthesia: Monitor Anesthesia Care | Site: Eye | Laterality: Left

## 2022-11-30 MED ORDER — MOXIFLOXACIN HCL 5 MG/ML IO SOLN
INTRAOCULAR | Status: DC | PRN
  Administered 2022-11-30: .3 mL via OPHTHALMIC

## 2022-11-30 MED ORDER — EPINEPHRINE PF 1 MG/ML IJ SOLN
INTRAOCULAR | Status: DC | PRN
  Administered 2022-11-30: 500 mL

## 2022-11-30 MED ORDER — DEXAMETHASONE 0.4 MG OP INST
VAGINAL_INSERT | OPHTHALMIC | Status: AC
  Filled 2022-11-30: qty 1

## 2022-11-30 MED ORDER — BSS IO SOLN
INTRAOCULAR | Status: DC | PRN
  Administered 2022-11-30: 15 mL via INTRAOCULAR

## 2022-11-30 MED ORDER — SODIUM HYALURONATE 23MG/ML IO SOSY
PREFILLED_SYRINGE | INTRAOCULAR | Status: DC | PRN
  Administered 2022-11-30: .6 mL via INTRAOCULAR

## 2022-11-30 MED ORDER — SODIUM CHLORIDE 0.9% FLUSH
INTRAVENOUS | Status: DC | PRN
Start: 2022-11-30 — End: 2022-11-30
  Administered 2022-11-30: 5 mL via INTRAVENOUS

## 2022-11-30 MED ORDER — STERILE WATER FOR IRRIGATION IR SOLN
Status: DC | PRN
  Administered 2022-11-30: 25 mL

## 2022-11-30 MED ORDER — MIDAZOLAM HCL 2 MG/2ML IJ SOLN
INTRAMUSCULAR | Status: AC
  Filled 2022-11-30: qty 2

## 2022-11-30 MED ORDER — EPINEPHRINE PF 1 MG/ML IJ SOLN
INTRAMUSCULAR | Status: AC
  Filled 2022-11-30: qty 1

## 2022-11-30 MED ORDER — SODIUM HYALURONATE 10 MG/ML IO SOLUTION
PREFILLED_SYRINGE | INTRAOCULAR | Status: DC | PRN
  Administered 2022-11-30: .85 mL via INTRAOCULAR

## 2022-11-30 MED ORDER — TETRACAINE HCL 0.5 % OP SOLN
1.0000 [drp] | OPHTHALMIC | Status: AC | PRN
  Administered 2022-11-30 (×3): 1 [drp] via OPHTHALMIC

## 2022-11-30 MED ORDER — PHENYLEPHRINE-KETOROLAC 1-0.3 % IO SOLN
INTRAOCULAR | Status: AC
  Filled 2022-11-30: qty 4

## 2022-11-30 MED ORDER — DEXAMETHASONE 0.4 MG OP INST
VAGINAL_INSERT | OPHTHALMIC | Status: DC | PRN
  Administered 2022-11-30: .4 mg via OPHTHALMIC

## 2022-11-30 MED ORDER — TROPICAMIDE 1 % OP SOLN
1.0000 [drp] | OPHTHALMIC | Status: AC | PRN
  Administered 2022-11-30 (×3): 1 [drp] via OPHTHALMIC

## 2022-11-30 MED ORDER — LIDOCAINE HCL 3.5 % OP GEL
1.0000 | Freq: Once | OPHTHALMIC | Status: AC
  Administered 2022-11-30: 1 via OPHTHALMIC

## 2022-11-30 MED ORDER — POVIDONE-IODINE 5 % OP SOLN
OPHTHALMIC | Status: DC | PRN
  Administered 2022-11-30: 1 via OPHTHALMIC

## 2022-11-30 MED ORDER — PHENYLEPHRINE HCL 2.5 % OP SOLN
1.0000 [drp] | OPHTHALMIC | Status: AC | PRN
  Administered 2022-11-30 (×3): 1 [drp] via OPHTHALMIC

## 2022-11-30 MED ORDER — LIDOCAINE HCL (PF) 1 % IJ SOLN
INTRAOCULAR | Status: DC | PRN
  Administered 2022-11-30: 1 mL via OPHTHALMIC

## 2022-11-30 MED ORDER — MOXIFLOXACIN HCL 5 MG/ML IO SOLN
INTRAOCULAR | Status: AC
  Filled 2022-11-30: qty 1

## 2022-11-30 MED ORDER — MIDAZOLAM HCL 2 MG/2ML IJ SOLN
INTRAMUSCULAR | Status: DC | PRN
  Administered 2022-11-30: 1 mg via INTRAVENOUS

## 2022-11-30 SURGICAL SUPPLY — 14 items
CATARACT SUITE SIGHTPATH (MISCELLANEOUS) ×1
CLOTH BEACON ORANGE TIMEOUT ST (SAFETY) ×1 IMPLANT
EYE SHIELD UNIVERSAL CLEAR (GAUZE/BANDAGES/DRESSINGS) IMPLANT
FEE CATARACT SUITE SIGHTPATH (MISCELLANEOUS) ×1 IMPLANT
GLOVE BIOGEL PI IND STRL 6.5 (GLOVE) IMPLANT
GLOVE BIOGEL PI IND STRL 7.0 (GLOVE) ×2 IMPLANT
LENS IOL TECNIS EYHANCE 28.0 (Intraocular Lens) IMPLANT
NDL HYPO 18GX1.5 BLUNT FILL (NEEDLE) ×1 IMPLANT
NEEDLE HYPO 18GX1.5 BLUNT FILL (NEEDLE) ×1
PAD ARMBOARD 7.5X6 YLW CONV (MISCELLANEOUS) ×1 IMPLANT
POSITIONER HEAD 8X9X4 ADT (SOFTGOODS) ×1 IMPLANT
SYR TB 1ML LL NO SAFETY (SYRINGE) ×1 IMPLANT
TAPE SURG TRANSPORE 1 IN (GAUZE/BANDAGES/DRESSINGS) IMPLANT
WATER STERILE IRR 250ML POUR (IV SOLUTION) ×1 IMPLANT

## 2022-11-30 NOTE — Anesthesia Procedure Notes (Addendum)
Procedure Name: MAC Date/Time: 11/30/2022 1:21 PM  Performed by: Julian Reil, CRNAPre-anesthesia Checklist: Patient identified, Emergency Drugs available, Suction available and Patient being monitored Patient Re-evaluated:Patient Re-evaluated prior to induction Oxygen Delivery Method: Nasal cannula Placement Confirmation: positive ETCO2

## 2022-11-30 NOTE — Interval H&P Note (Signed)
History and Physical Interval Note:  11/30/2022 1:16 PM  Beth [name]  has presented today for surgery, with the diagnosis of age related nuclear cataract, left eye.  The various methods of treatment have been discussed with the patient and family. After consideration of risks, benefits and other options for treatment, the patient has consented to  Procedure(s): CATARACT EXTRACTION PHACO AND INTRAOCULAR LENS PLACEMENT (IOC) with placement of Corticosteroid (Left) as a surgical intervention.  The patient's history has been reviewed, patient examined, no change in status, stable for surgery.  I have reviewed the patient's chart and labs.  Questions were answered to the patient's satisfaction.     Fabio Pierce

## 2022-11-30 NOTE — Discharge Instructions (Signed)
Please discharge patient when stable, will follow up today with Dr. Carnella Fryman at the [facility] office immediately following discharge.  Leave shield in place until visit.  All paperwork with discharge instructions will be given at the office.  [facility] Address:  [address]  [place], [place] [address]  

## 2022-11-30 NOTE — Op Note (Signed)
Date of procedure: 11/30/22  Pre-operative diagnosis: Visually significant age-related nuclear cataract, Left Eye (H25.12)  Post-operative diagnosis:  Visually significant age-related nuclear cataract, Left Eye (H25.12) 2.   Pain and inflammation following cataract surgery, Left Eye (H57.12)  Procedure:  Removal of cataract via phacoemulsification and insertion of intra-ocular lens Johnson and Johnson DIB00 +28.0D into the capsular bag of the Left Eye 2. Placement of Dextenza Implant, Left Lower Lid  Attending surgeon: Rudy Jew. Sekai Nayak, MD, MA  Anesthesia: MAC, Topical Akten  Complications: None  Estimated Blood Loss: <71mL (minimal)  Specimens: None  Implants: As above  Indications:  Visually significant age-related cataract, Left Eye  Procedure:  The patient was seen and identified in the pre-operative area. The operative eye was identified and dilated.  The operative eye was marked.  Topical anesthesia was administered to the operative eye.     The patient was then to the operative suite and placed in the supine position.  A timeout was performed confirming the patient, procedure to be performed, and all other relevant information.   The patient's face was prepped and draped in the usual fashion for intra-ocular surgery.  A lid speculum was placed into the operative eye and the surgical microscope moved into place and focused.  An inferotemporal paracentesis was created using a 20 gauge paracentesis blade.  Shugarcaine was injected into the anterior chamber.  Viscoelastic was injected into the anterior chamber.  A temporal clear-corneal main wound incision was created using a 2.16mm microkeratome.  A continuous curvilinear capsulorrhexis was initiated using an irrigating cystitome and completed using capsulorrhexis forceps.  Hydrodissection and hydrodeliniation were performed.  Viscoelastic was injected into the anterior chamber.  A phacoemulsification handpiece and a chopper as a  second instrument were used to remove the nucleus and epinucleus. The irrigation/aspiration handpiece was used to remove any remaining cortical material.   The capsular bag was reinflated with viscoelastic, checked, and found to be intact.  The intraocular lens was inserted into the capsular bag.  The irrigation/aspiration handpiece was used to remove any remaining viscoelastic.  The clear corneal wound and paracentesis wounds were then hydrated and checked with Weck-Cels to be watertight.  0.75mL of moxifloxacin was injected into the anterior chamber.  The lid-speculum was removed. The lower punctum was dilated. A Dextenza implant was placed in the lower canaliculus without complication.   The drape was removed.  The patient's face was cleaned with a wet and dry 4x4.   A clear shield was taped over the eye. The patient was taken to the post-operative care unit in good condition, having tolerated the procedure well.  Post-Op Instructions: The patient will follow up at [facility] for a same day post-operative evaluation and will receive all other orders and instructions.

## 2022-11-30 NOTE — Anesthesia Preprocedure Evaluation (Signed)
Anesthesia Evaluation  Patient identified by MRN, date of birth, ID band Patient awake    Reviewed: Allergy & Precautions, H&P , NPO status , Patient's Chart, lab work & pertinent test results, reviewed documented beta blocker date and time   Airway Mallampati: II  TM Distance: >3 FB Neck ROM: Full    Dental  (+) Edentulous Upper, Edentulous Lower   Pulmonary COPD (oxygen during night),  COPD inhaler and oxygen dependent, former smoker   Pulmonary exam normal breath sounds clear to auscultation       Cardiovascular hypertension, Pt. on medications and Pt. on home beta blockers + CAD  Normal cardiovascular exam Rhythm:Regular Rate:Normal     Neuro/Psych negative neurological ROS  negative psych ROS   GI/Hepatic negative GI ROS, Neg liver ROS,,,  Endo/Other  negative endocrine ROS    Renal/GU negative Renal ROS  negative genitourinary   Musculoskeletal negative musculoskeletal ROS (+)    Abdominal   Peds negative pediatric ROS (+)  Hematology negative hematology ROS (+)   Anesthesia Other Findings Thoracic back pain  Reproductive/Obstetrics negative OB ROS                             Anesthesia Physical Anesthesia Plan  ASA: 3  Anesthesia Plan: MAC   Post-op Pain Management: Minimal or no pain anticipated   Induction: Intravenous  PONV Risk Score and Plan: 0 and Treatment may vary due to age or medical condition  Airway Management Planned: Nasal Cannula and Natural Airway  Additional Equipment:   Intra-op Plan:   Post-operative Plan:   Informed Consent: I have reviewed the patients History and Physical, chart, labs and discussed the procedure including the risks, benefits and alternatives for the proposed anesthesia with the patient or authorized representative who has indicated his/her understanding and acceptance.     Dental advisory given  Plan Discussed with: CRNA and  Surgeon  Anesthesia Plan Comments:         Anesthesia Quick Evaluation

## 2022-11-30 NOTE — Transfer of Care (Signed)
Immediate Anesthesia Transfer of Care Note  Patient: Beth [name]  Procedure(s) Performed: CATARACT EXTRACTION PHACO AND INTRAOCULAR LENS PLACEMENT (IOC) with placement of Corticosteroid (Left: Eye)  Patient Location: Short Stay  Anesthesia Type:MAC  Level of Consciousness: awake, alert , and oriented  Airway & Oxygen Therapy: Patient Spontanous Breathing  Post-op Assessment: Report given to RN and Post -op Vital signs reviewed and stable  Post vital signs: Reviewed and stable  Last Vitals:  Vitals Value Taken Time  BP    Temp    Pulse    Resp    SpO2      Last Pain:  Vitals:   11/30/22 1214  TempSrc: Oral  PainSc: 0-No pain         Complications: No notable events documented.

## 2022-12-07 NOTE — Anesthesia Postprocedure Evaluation (Signed)
Anesthesia Post Note  Patient: Elisabella Hacker  Procedure(s) Performed: CATARACT EXTRACTION PHACO AND INTRAOCULAR LENS PLACEMENT (IOC) with placement of Corticosteroid (Left: Eye)  Patient location during evaluation: Phase II Anesthesia Type: MAC Level of consciousness: awake Pain management: pain level controlled Vital Signs Assessment: post-procedure vital signs reviewed and stable Respiratory status: spontaneous breathing and respiratory function stable Cardiovascular status: blood pressure returned to baseline and stable Postop Assessment: no headache and no apparent nausea or vomiting Anesthetic complications: no Comments: Late entry   No notable events documented.   Last Vitals:  Vitals:   11/30/22 1214 11/30/22 1342  BP: (!) 142/67 (!) 140/73  Pulse: 93 77  Resp: 20 14  Temp: (!) 36.3 C 36.9 C  SpO2: 95% 96%    Last Pain:  Vitals:   11/30/22 1342  TempSrc: Oral  PainSc: 0-No pain                 Windell Norfolk

## 2022-12-30 DIAGNOSIS — H52223 Regular astigmatism, bilateral: Secondary | ICD-10-CM | POA: Diagnosis not present

## 2022-12-30 DIAGNOSIS — H524 Presbyopia: Secondary | ICD-10-CM | POA: Diagnosis not present

## 2023-02-01 ENCOUNTER — Other Ambulatory Visit: Payer: Self-pay | Admitting: *Deleted

## 2023-02-01 ENCOUNTER — Encounter: Payer: Medicare HMO | Admitting: Internal Medicine

## 2023-02-01 DIAGNOSIS — R030 Elevated blood-pressure reading, without diagnosis of hypertension: Secondary | ICD-10-CM

## 2023-02-01 MED ORDER — HYDROCHLOROTHIAZIDE 25 MG PO TABS: 25.0000 mg | ORAL_TABLET | Freq: Every day | ORAL | 1 refills | Status: DC

## 2023-02-16 ENCOUNTER — Encounter: Payer: Self-pay | Admitting: Internal Medicine

## 2023-02-16 ENCOUNTER — Ambulatory Visit (INDEPENDENT_AMBULATORY_CARE_PROVIDER_SITE_OTHER): Payer: Medicare HMO | Admitting: Internal Medicine

## 2023-02-16 ENCOUNTER — Other Ambulatory Visit: Payer: Self-pay | Admitting: Emergency Medicine

## 2023-02-16 VITALS — BP 130/80 | HR 98 | Temp 98.2°F | Ht 61.5 in | Wt 126.1 lb

## 2023-02-16 DIAGNOSIS — R7302 Impaired glucose tolerance (oral): Secondary | ICD-10-CM

## 2023-02-16 DIAGNOSIS — Z0000 Encounter for general adult medical examination without abnormal findings: Secondary | ICD-10-CM

## 2023-02-16 DIAGNOSIS — J9611 Chronic respiratory failure with hypoxia: Secondary | ICD-10-CM | POA: Diagnosis not present

## 2023-02-16 DIAGNOSIS — I10 Essential (primary) hypertension: Secondary | ICD-10-CM

## 2023-02-16 DIAGNOSIS — M8000XD Age-related osteoporosis with current pathological fracture, unspecified site, subsequent encounter for fracture with routine healing: Secondary | ICD-10-CM

## 2023-02-16 DIAGNOSIS — J439 Emphysema, unspecified: Secondary | ICD-10-CM | POA: Diagnosis not present

## 2023-02-16 LAB — CBC WITH DIFFERENTIAL/PLATELET
Basophils Absolute: 0.1 10*3/uL (ref 0.0–0.1)
Basophils Relative: 0.9 % (ref 0.0–3.0)
Eosinophils Absolute: 0 10*3/uL (ref 0.0–0.7)
Eosinophils Relative: 0.7 % (ref 0.0–5.0)
HCT: 44.5 % (ref 36.0–46.0)
Hemoglobin: 14.8 g/dL (ref 12.0–15.0)
Lymphocytes Relative: 17.1 % (ref 12.0–46.0)
Lymphs Abs: 1.2 10*3/uL (ref 0.7–4.0)
MCHC: 33.2 g/dL (ref 30.0–36.0)
MCV: 92.6 fL (ref 78.0–100.0)
Monocytes Absolute: 0.6 10*3/uL (ref 0.1–1.0)
Monocytes Relative: 8.8 % (ref 3.0–12.0)
Neutro Abs: 5.2 10*3/uL (ref 1.4–7.7)
Neutrophils Relative %: 72.5 % (ref 43.0–77.0)
Platelets: 256 10*3/uL (ref 150.0–400.0)
RBC: 4.81 Mil/uL (ref 3.87–5.11)
RDW: 13.7 % (ref 11.5–15.5)
WBC: 7.2 10*3/uL (ref 4.0–10.5)

## 2023-02-16 LAB — LIPID PANEL
Cholesterol: 131 mg/dL (ref 0–200)
HDL: 72.4 mg/dL (ref >39.00)
LDL Cholesterol: 43 mg/dL (ref 0–99)
NonHDL: 59.06
Total CHOL/HDL Ratio: 2
Triglycerides: 82 mg/dL (ref 0.0–149.0)
VLDL: 16.4 mg/dL (ref 0.0–40.0)

## 2023-02-16 LAB — COMPREHENSIVE METABOLIC PANEL
ALT: 15 U/L (ref 0–35)
AST: 23 U/L (ref 0–37)
Albumin: 4.7 g/dL (ref 3.5–5.2)
Alkaline Phosphatase: 62 U/L (ref 39–117)
BUN: 14 mg/dL (ref 6–23)
CO2: 33 meq/L — ABNORMAL HIGH (ref 19–32)
Calcium: 9.5 mg/dL (ref 8.4–10.5)
Chloride: 96 meq/L (ref 96–112)
Creatinine, Ser: 0.67 mg/dL (ref 0.40–1.20)
GFR: 85.92 mL/min (ref >60.00)
Glucose, Bld: 108 mg/dL — ABNORMAL HIGH (ref 70–99)
Potassium: 3.6 meq/L (ref 3.5–5.1)
Sodium: 138 meq/L (ref 135–145)
Total Bilirubin: 0.5 mg/dL (ref 0.2–1.2)
Total Protein: 7.4 g/dL (ref 6.0–8.3)

## 2023-02-16 LAB — VITAMIN B12: Vitamin B-12: 255 pg/mL (ref 211–911)

## 2023-02-16 LAB — VITAMIN D 25 HYDROXY (VIT D DEFICIENCY, FRACTURES): VITD: 74.11 ng/mL (ref 30.00–100.00)

## 2023-02-16 LAB — HEMOGLOBIN A1C: Hgb A1c MFr Bld: 6.2 % (ref 4.6–6.5)

## 2023-02-16 NOTE — Patient Instructions (Signed)
-  Nice seeing you today!!  -Lab work today; will notify you once results are available.  -Update COVID and RSV vaccines at your pharmacy.  -See you back in 6 months.

## 2023-02-16 NOTE — Addendum Note (Signed)
Addended by: Sumner Boast on: 02/16/2023 12:11 PM   Modules accepted: Orders

## 2023-02-16 NOTE — Progress Notes (Signed)
Medicare Wellness question were answered 02/15/23

## 2023-02-16 NOTE — Progress Notes (Signed)
Established Patient Office Visit     CC/Reason for Visit: Annual preventive exam and subsequent Medicare wellness visit  HPI: Beth [name] is a 72 y.o. female who is coming in today for the above mentioned reasons. Past Medical History is significant for: Hypertension, hyperlipidemia, impaired glucose tolerance, osteoporosis and COPD on nightly oxygen.  Feeling well.  Is due for COVID and RSV vaccines.  Mammogram and colon cancer screening are up-to-date.   Past Medical/Surgical History: Past Medical History:  Diagnosis Date   Age-related osteoporosis with current pathological fracture 03/11/2017   DEXA bone scan at [facility] 03/29/2017 T score -3.1 at Rt total femur   COPD (chronic obstructive pulmonary disease) (HCC)    Coronary atherosclerosis of native coronary artery 07/22/2017   Seen on chest Ct 07/21/17. Aortic and branch vessel atherosclerosis. Normal heart size, without pericardial effusion. Multivessel coronary artery atherosclerosis.   Hypertension    Non-traumatic compression fracture of vertebral column (HCC) 02/18/2017   Chest CT 07/21/17: Moderate T5 and moderate to severe T6 compression deformities w/o significant canal encroachment. Moderate T8 compression deformity is also w/o canal encroachment   Squamous cell cancer of external ear, right 03/2017   right, excison by Dr. Suszanne Conners 04/2017    Past Surgical History:  Procedure Laterality Date   EYE SURGERY     MASS EXCISION Right 04/12/2017   Procedure: EXCISION OF RIGHT EAR  MASS;  Surgeon: Newman Pies, MD;  Location: [facility];  Service: ENT;  Laterality: Right; squamous cell cancer with clear margins   SKIN FULL THICKNESS GRAFT Left 04/12/2017   Procedure: SKIN GRAFT FULL THICKNESS FROM ABDOMEN TO RIGHT EAR;  Surgeon: Newman Pies, MD;  Location: [facility];  Service: ENT;  Laterality: Right   TONSILLECTOMY      Social History:  reports that she quit smoking about 6 years ago. Her  smoking use included cigarettes. She has never used smokeless tobacco. She reports that she does not drink alcohol and does not use drugs.  Allergies: Allergies  Allergen Reactions   Cefzil [Cefprozil] Other (See Comments)    Burning sensation in lower legs.    Zanaflex [Tizanidine Hcl] Nausea And Vomiting    Family History:  Family History  Problem Relation Age of Onset   Heart attack Mother    Stroke Mother    Lung cancer Father    Arthritis Sister      Current Outpatient Medications:    acetaminophen (TYLENOL) 500 MG tablet, Take 500 mg by mouth every 6 (six) hours as needed for mild pain or moderate pain., Disp: , Rfl:    albuterol (VENTOLIN HFA) 108 (90 Base) MCG/ACT inhaler, Inhale 2 puffs into the lungs every 4 (four) hours as needed for wheezing or shortness of breath (cough, shortness of breath or wheezing.)., Disp: 8 g, Rfl: 5   alendronate (FOSAMAX) 70 MG tablet, Take 1 tablet by mouth once a week, Disp: 12 tablet, Rfl: 1   aspirin EC 81 MG tablet, Take 1 tablet (81 mg total) by mouth daily., Disp: 30 tablet, Rfl: 11   hydrochlorothiazide (HYDRODIURIL) 25 MG tablet, Take 1 tablet (25 mg total) by mouth daily., Disp: 90 tablet, Rfl: 1   naproxen sodium (ALEVE) 220 MG tablet, Take 220 mg by mouth 2 (two) times daily as needed (for pain)., Disp: , Rfl:    rosuvastatin (CRESTOR) 10 MG tablet, Take 1 tablet by mouth once daily, Disp: 90 tablet, Rfl: 1   TRELEGY ELLIPTA  100-62.5-25 MCG/ACT AEPB, INHALE 1 PUFF INTO THE LUNGS DAILY., Disp: 180 each, Rfl: 10  Review of Systems:  Negative unless indicated in HPI.   Physical Exam: Vitals:   02/16/23 0921  BP: 130/80  Pulse: 98  Temp: 98.2 F (36.8 C)  TempSrc: Oral  SpO2: 92%  Weight: 126 lb 1.6 oz (57.2 kg)  Height: 5' 1.5" (1.562 m)    Body mass index is 23.44 kg/m.   Physical Exam Vitals reviewed.  Constitutional:      General: She is not in acute distress.    Appearance: Normal appearance. She is not  ill-appearing, toxic-appearing or diaphoretic.  HENT:     Head: Normocephalic.     Right Ear: Tympanic membrane, ear canal and external ear normal. There is no impacted cerumen.     Left Ear: Tympanic membrane, ear canal and external ear normal. There is no impacted cerumen.     Nose: Nose normal.     Mouth/Throat:     Mouth: Mucous membranes are moist.     Pharynx: Oropharynx is clear. No oropharyngeal exudate or posterior oropharyngeal erythema.  Eyes:     General: No scleral icterus.       Right eye: No discharge.        Left eye: No discharge.     Conjunctiva/sclera: Conjunctivae normal.     Pupils: Pupils are equal, round, and reactive to light.  Neck:     Vascular: No carotid bruit.  Cardiovascular:     Rate and Rhythm: Normal rate and regular rhythm.     Pulses: Normal pulses.     Heart sounds: Normal heart sounds.  Pulmonary:     Effort: Pulmonary effort is normal. No respiratory distress.     Breath sounds: Normal breath sounds.  Abdominal:     General: Abdomen is flat. Bowel sounds are normal.     Palpations: Abdomen is soft.  Musculoskeletal:        General: Normal range of motion.     Cervical back: Normal range of motion.  Skin:    General: Skin is warm and dry.  Neurological:     General: No focal deficit present.     Mental Status: She is alert and oriented to person, place, and time. Mental status is at baseline.  Psychiatric:        Mood and Affect: Mood normal.        Behavior: Behavior normal.        Thought Content: Thought content normal.        Judgment: Judgment normal.    Subsequent Medicare wellness visit   1. Risk factors, based on past  M,S,F - Cardiac Risk Factors include: advanced age (>11men, >75 women)   2.  Physical activities: Dietary issues and exercise activities discussed:      3.  Depression/mood:  Flowsheet Row Office Visit from 02/16/2023 in [facility] Total Score 0        4.   ADL's:    02/15/2023    4:49 PM 11/30/2022   12:13 PM  In your present state of health, do you have any difficulty performing the following activities:  Hearing? 1 0  Vision? 0 1  Difficulty concentrating or making decisions? 0 0  Walking or climbing stairs? 1 0  Comment sob   Dressing or bathing? 0 0  Doing errands, shopping? 0   Preparing Food and eating ? N   Using the Toilet? N  In the past six months, have you accidently leaked urine? N   Do you have problems with loss of bowel control? N   Managing your Medications? N   Managing your Finances? N   Housekeeping or managing your Housekeeping? N      5.  Fall risk:     08/20/2021   10:37 AM 02/12/2022    9:30 AM 08/08/2022    9:06 AM 08/13/2022    8:54 AM 02/16/2023    9:28 AM  Fall Risk  Falls in the past year? 0 0 0 0 0  Was there an injury with Fall? 0 0  0 0  Fall Risk Category Calculator 0 0  0 0  Fall Risk Category (Retired) Low Low     (RETIRED) Patient Fall Risk Level Low fall risk Low fall risk     Patient at Risk for Falls Due to No Fall Risks No Fall Risks     Fall risk Follow up Falls evaluation completed Falls evaluation completed  Falls evaluation completed Falls evaluation completed     6.  Home safety: No problems identified   7.  Height weight, and visual acuity: height and weight as above, vision/hearing: Vision Screening   Right eye Left eye Both eyes  Without correction     With correction 20/40 20/40 20/40      8.  Counseling: Counseling given: Not Answered    9. Lab orders based on risk factors: Laboratory update will be reviewed   10. Cognitive assessment:        02/15/2023    4:52 PM 02/14/2019   10:06 AM 01/28/2018   11:01 AM  6CIT Screen  What Year? 0 points 0 points 0 points  What month? 0 points 0 points 0 points  What time? 0 points 0 points 0 points  Count back from 20 0 points 0 points 0 points  Months in reverse 0 points 0 points 0 points  Repeat phrase 0 points 0 points 0  points  Total Score 0 points 0 points 0 points     11. Screening: Patient provided with a written and personalized 5-10 year screening schedule in the AVS. Health Maintenance  Topic Date Due   Zoster (Shingles) Vaccine (1 of 2) 05/27/1998   COVID-19 Vaccine (1 - 2023-24 season) Never done   Medicare Annual Wellness Visit  02/16/2024   Mammogram  04/20/2024   Cologuard (Stool DNA test)  03/11/2025   DTaP/Tdap/Td vaccine (3 - Tdap) 04/09/2030   Pneumonia Vaccine  Completed   Flu Shot  Completed   DEXA scan (bone density measurement)  Completed   Hepatitis C Screening  Completed   HPV Vaccine  Aged Out    12. Provider List Update: Patient Care Team    Relationship Specialty Notifications Start End  Philip Aspen, Limmie Patricia, MD PCP - General Internal Medicine  08/22/20   Newman Pies, MD Consulting Physician Otolaryngology  04/07/17   Adelfa Koh, MD Referring Physician Orthopedic Surgery  07/10/17   Coralyn Helling, MD (Inactive) Consulting Physician Pulmonary Disease  01/23/18    Comment: [place] [facility]     13. Advance Directives: Does Patient Have a Medical Advance Directive?: Yes Type of Advance Directive: Healthcare Power of Attorney, Living will, Out of facility DNR (pink MOST or yellow form) Does patient want to make changes to medical advance directive?: No - Patient declined Copy of Healthcare Power of Attorney in Chart?: No - copy requested  14. Opioids: Patient is not on  any opioid prescriptions and has no risk factors for a substance use disorder.   15.   Goals      Patient Stated     Loose weight .            I have personally reviewed and noted the following in the patient's chart:   Medical and social history Use of alcohol, tobacco or illicit drugs  Current medications and supplements Functional ability and status Nutritional status Physical activity Advanced directives List of other physicians Hospitalizations, surgeries, and ER visits  in previous 12 months Vitals Screenings to include cognitive, depression, and falls Referrals and appointments  In addition, I have reviewed and discussed with patient certain preventive protocols, quality metrics, and best practice recommendations. A written personalized care plan for preventive services as well as general preventive health recommendations were provided to patient.   Impression and Plan:  Essential hypertension -     CBC with Differential/Platelet; Future -     Comprehensive metabolic panel; Future -     Lipid panel; Future  IGT (impaired glucose tolerance) -     Hemoglobin A1c; Future  Medicare annual wellness visit, subsequent  Age-related osteoporosis with current pathological fracture with routine healing, subsequent encounter -     VITAMIN D 25 Hydroxy (Vit-D Deficiency, Fractures); Future  Chronic respiratory failure with hypoxia (HCC)  Pulmonary emphysema, unspecified emphysema type (HCC)  -Recommend routine eye and dental care. -Healthy lifestyle discussed in detail. -Labs to be updated today. -Prostate cancer screening: N/A Health Maintenance  Topic Date Due   Zoster (Shingles) Vaccine (1 of 2) 05/27/1998   COVID-19 Vaccine (1 - 2023-24 season) Never done   Medicare Annual Wellness Visit  02/16/2024   Mammogram  04/20/2024   Cologuard (Stool DNA test)  03/11/2025   DTaP/Tdap/Td vaccine (3 - Tdap) 04/09/2030   Pneumonia Vaccine  Completed   Flu Shot  Completed   DEXA scan (bone density measurement)  Completed   Hepatitis C Screening  Completed   HPV Vaccine  Aged Out    -Advised to update RSV and COVID vaccines     Corleone Biegler Philip Aspen, MD [place]

## 2023-02-16 NOTE — Addendum Note (Signed)
Addended by: Kern Reap B on: 02/16/2023 10:40 AM   Modules accepted: Orders

## 2023-02-25 ENCOUNTER — Other Ambulatory Visit: Payer: Self-pay | Admitting: Acute Care

## 2023-02-25 DIAGNOSIS — Z122 Encounter for screening for malignant neoplasm of respiratory organs: Secondary | ICD-10-CM

## 2023-02-25 DIAGNOSIS — Z87891 Personal history of nicotine dependence: Secondary | ICD-10-CM

## 2023-03-11 ENCOUNTER — Other Ambulatory Visit: Payer: Self-pay | Admitting: Internal Medicine

## 2023-03-11 DIAGNOSIS — Z1231 Encounter for screening mammogram for malignant neoplasm of breast: Secondary | ICD-10-CM

## 2023-03-12 ENCOUNTER — Other Ambulatory Visit: Payer: Self-pay | Admitting: Internal Medicine

## 2023-03-12 DIAGNOSIS — M810 Age-related osteoporosis without current pathological fracture: Secondary | ICD-10-CM

## 2023-03-18 DIAGNOSIS — L988 Other specified disorders of the skin and subcutaneous tissue: Secondary | ICD-10-CM | POA: Diagnosis not present

## 2023-03-18 DIAGNOSIS — Z08 Encounter for follow-up examination after completed treatment for malignant neoplasm: Secondary | ICD-10-CM | POA: Diagnosis not present

## 2023-03-18 DIAGNOSIS — L570 Actinic keratosis: Secondary | ICD-10-CM | POA: Diagnosis not present

## 2023-03-18 DIAGNOSIS — X32XXXA Exposure to sunlight, initial encounter: Secondary | ICD-10-CM | POA: Diagnosis not present

## 2023-03-18 DIAGNOSIS — L578 Other skin changes due to chronic exposure to nonionizing radiation: Secondary | ICD-10-CM | POA: Diagnosis not present

## 2023-03-18 DIAGNOSIS — Z85828 Personal history of other malignant neoplasm of skin: Secondary | ICD-10-CM | POA: Diagnosis not present

## 2023-03-22 ENCOUNTER — Other Ambulatory Visit: Payer: Self-pay | Admitting: Internal Medicine

## 2023-03-22 DIAGNOSIS — I2510 Atherosclerotic heart disease of native coronary artery without angina pectoris: Secondary | ICD-10-CM

## 2023-03-24 ENCOUNTER — Other Ambulatory Visit: Payer: Self-pay | Admitting: *Deleted

## 2023-03-24 DIAGNOSIS — I2510 Atherosclerotic heart disease of native coronary artery without angina pectoris: Secondary | ICD-10-CM

## 2023-03-24 DIAGNOSIS — M810 Age-related osteoporosis without current pathological fracture: Secondary | ICD-10-CM

## 2023-03-24 MED ORDER — ALENDRONATE SODIUM 70 MG PO TABS: 70.0000 mg | ORAL_TABLET | ORAL | 2 refills | Status: DC

## 2023-03-24 MED ORDER — ROSUVASTATIN CALCIUM 10 MG PO TABS: 10.0000 mg | ORAL_TABLET | Freq: Every day | ORAL | 1 refills | Status: DC

## 2023-04-01 DIAGNOSIS — H353131 Nonexudative age-related macular degeneration, bilateral, early dry stage: Secondary | ICD-10-CM | POA: Diagnosis not present

## 2023-04-13 DIAGNOSIS — L570 Actinic keratosis: Secondary | ICD-10-CM | POA: Diagnosis not present

## 2023-04-23 ENCOUNTER — Ambulatory Visit: Payer: Medicare HMO

## 2023-04-29 ENCOUNTER — Ambulatory Visit (HOSPITAL_COMMUNITY)
Admission: RE | Admit: 2023-04-29 | Discharge: 2023-04-29 | Disposition: A | Discharge status: Home / Self Care | Payer: Medicare HMO | Source: Ambulatory Visit | Attending: Acute Care | Admitting: Acute Care

## 2023-04-29 DIAGNOSIS — Z87891 Personal history of nicotine dependence: Secondary | ICD-10-CM | POA: Diagnosis not present

## 2023-04-29 DIAGNOSIS — Z122 Encounter for screening for malignant neoplasm of respiratory organs: Secondary | ICD-10-CM | POA: Diagnosis not present

## 2023-05-04 ENCOUNTER — Ambulatory Visit
Admission: RE | Admit: 2023-05-04 | Discharge: 2023-05-04 | Disposition: A | Discharge status: Home / Self Care | Payer: Medicare HMO | Source: Ambulatory Visit | Attending: Internal Medicine | Admitting: Internal Medicine

## 2023-05-04 DIAGNOSIS — Z1231 Encounter for screening mammogram for malignant neoplasm of breast: Secondary | ICD-10-CM

## 2023-05-19 ENCOUNTER — Other Ambulatory Visit: Payer: Self-pay

## 2023-05-19 DIAGNOSIS — Z87891 Personal history of nicotine dependence: Secondary | ICD-10-CM

## 2023-05-19 DIAGNOSIS — Z122 Encounter for screening for malignant neoplasm of respiratory organs: Secondary | ICD-10-CM

## 2023-05-26 DIAGNOSIS — J9611 Chronic respiratory failure with hypoxia: Secondary | ICD-10-CM | POA: Diagnosis not present

## 2023-05-26 DIAGNOSIS — Z87891 Personal history of nicotine dependence: Secondary | ICD-10-CM | POA: Diagnosis not present

## 2023-05-26 DIAGNOSIS — J432 Centrilobular emphysema: Secondary | ICD-10-CM | POA: Diagnosis not present

## 2023-06-21 ENCOUNTER — Other Ambulatory Visit: Payer: Self-pay | Admitting: *Deleted

## 2023-06-21 DIAGNOSIS — I2510 Atherosclerotic heart disease of native coronary artery without angina pectoris: Secondary | ICD-10-CM

## 2023-06-21 MED ORDER — ROSUVASTATIN CALCIUM 10 MG PO TABS
10.0000 mg | ORAL_TABLET | Freq: Every day | ORAL | 1 refills | Status: DC
Start: 2023-06-21 — End: 2024-01-24

## 2023-07-01 DIAGNOSIS — H524 Presbyopia: Secondary | ICD-10-CM | POA: Diagnosis not present

## 2023-07-01 DIAGNOSIS — H353131 Nonexudative age-related macular degeneration, bilateral, early dry stage: Secondary | ICD-10-CM | POA: Diagnosis not present

## 2023-07-27 DIAGNOSIS — Z85828 Personal history of other malignant neoplasm of skin: Secondary | ICD-10-CM | POA: Diagnosis not present

## 2023-07-27 DIAGNOSIS — L578 Other skin changes due to chronic exposure to nonionizing radiation: Secondary | ICD-10-CM | POA: Diagnosis not present

## 2023-07-27 DIAGNOSIS — R233 Spontaneous ecchymoses: Secondary | ICD-10-CM | POA: Diagnosis not present

## 2023-07-27 DIAGNOSIS — Z08 Encounter for follow-up examination after completed treatment for malignant neoplasm: Secondary | ICD-10-CM | POA: Diagnosis not present

## 2023-08-13 ENCOUNTER — Other Ambulatory Visit: Payer: Self-pay | Admitting: Internal Medicine

## 2023-08-13 DIAGNOSIS — R030 Elevated blood-pressure reading, without diagnosis of hypertension: Secondary | ICD-10-CM

## 2023-08-31 ENCOUNTER — Ambulatory Visit (INDEPENDENT_AMBULATORY_CARE_PROVIDER_SITE_OTHER): Payer: Medicare HMO | Admitting: Internal Medicine

## 2023-08-31 ENCOUNTER — Encounter: Payer: Self-pay | Admitting: Internal Medicine

## 2023-08-31 VITALS — BP 120/84 | HR 82 | Temp 98.1°F | Ht 61.5 in | Wt 126.5 lb

## 2023-08-31 DIAGNOSIS — M8000XD Age-related osteoporosis with current pathological fracture, unspecified site, subsequent encounter for fracture with routine healing: Secondary | ICD-10-CM

## 2023-08-31 DIAGNOSIS — I10 Essential (primary) hypertension: Secondary | ICD-10-CM

## 2023-08-31 DIAGNOSIS — R7302 Impaired glucose tolerance (oral): Secondary | ICD-10-CM | POA: Diagnosis not present

## 2023-08-31 DIAGNOSIS — J439 Emphysema, unspecified: Secondary | ICD-10-CM

## 2023-08-31 LAB — POCT GLYCOSYLATED HEMOGLOBIN (HGB A1C): Hemoglobin A1C: 5.9 % — AB (ref 4.0–5.6)

## 2023-08-31 NOTE — Progress Notes (Signed)
 Established Patient Office Visit     CC/Reason for Visit: Follow-up chronic conditions  HPI: Beth [name] is a 73 y.o. female who is coming in today for the above mentioned reasons. Past Medical History is significant for: Hypertension, hyperlipidemia, impaired glucose tolerance, osteoporosis, COPD.  She feels well today without concerns or complaints.   Past Medical/Surgical History: Past Medical History:  Diagnosis Date   Age-related osteoporosis with current pathological fracture 03/11/2017   DEXA bone scan at [facility] 03/29/2017 T score -3.1 at Rt total femur   COPD (chronic obstructive pulmonary disease) (HCC)    Coronary atherosclerosis of native coronary artery 07/22/2017   Seen on chest Ct 07/21/17. Aortic and branch vessel atherosclerosis. Normal heart size, without pericardial effusion. Multivessel coronary artery atherosclerosis.   Hypertension    Non-traumatic compression fracture of vertebral column (HCC) 02/18/2017   Chest CT 07/21/17: Moderate T5 and moderate to severe T6 compression deformities w/o significant canal encroachment. Moderate T8 compression deformity is also w/o canal encroachment   Squamous cell cancer of external ear, right 03/2017   right, excison by Dr. Karis 04/2017    Past Surgical History:  Procedure Laterality Date   EYE SURGERY     MASS EXCISION Right 04/12/2017   Procedure: EXCISION OF RIGHT EAR  MASS;  Surgeon: Karis Clunes, MD;  Location: [facility];  Service: ENT;  Laterality: Right; squamous cell cancer with clear margins   SKIN FULL THICKNESS GRAFT Left 04/12/2017   Procedure: SKIN GRAFT FULL THICKNESS FROM ABDOMEN TO RIGHT EAR;  Surgeon: Karis Clunes, MD;  Location: [facility];  Service: ENT;  Laterality: Right   TONSILLECTOMY      Social History:  reports that she quit smoking about 6 years ago. Her smoking use included cigarettes. She has never used smokeless tobacco. She reports that she does not drink  alcohol and does not use drugs.  Allergies: Allergies  Allergen Reactions   Cefzil [Cefprozil] Other (See Comments)    Burning sensation in lower legs.    Zanaflex  [Tizanidine  Hcl] Nausea And Vomiting    Family History:  Family History  Problem Relation Age of Onset   Heart attack Mother    Stroke Mother    Lung cancer Father    Arthritis Sister      Current Outpatient Medications:    acetaminophen  (TYLENOL ) 500 MG tablet, Take 500 mg by mouth every 6 (six) hours as needed for mild pain or moderate pain., Disp: , Rfl:    albuterol  (VENTOLIN  HFA) 108 (90 Base) MCG/ACT inhaler, Inhale 2 puffs into the lungs every 4 (four) hours as needed for wheezing or shortness of breath (cough, shortness of breath or wheezing.)., Disp: 8 g, Rfl: 5   alendronate  (FOSAMAX ) 70 MG tablet, Take 1 tablet (70 mg total) by mouth once a week., Disp: 12 tablet, Rfl: 2   aspirin  EC 81 MG tablet, Take 1 tablet (81 mg total) by mouth daily., Disp: 30 tablet, Rfl: 11   hydrochlorothiazide  (HYDRODIURIL ) 25 MG tablet, TAKE 1 TABLET EVERY DAY, Disp: 90 tablet, Rfl: 1   naproxen sodium (ALEVE) 220 MG tablet, Take 220 mg by mouth 2 (two) times daily as needed (for pain)., Disp: , Rfl:    rosuvastatin  (CRESTOR ) 10 MG tablet, Take 1 tablet (10 mg total) by mouth daily., Disp: 90 tablet, Rfl: 1   TRELEGY ELLIPTA  100-62.5-25 MCG/ACT AEPB, INHALE 1 PUFF INTO THE LUNGS DAILY., Disp: 180 each, Rfl: 10  Review of  Systems:  Negative unless indicated in HPI.   Physical Exam: Vitals:   08/31/23 0948  BP: 120/84  Pulse: 82  Temp: 98.1 F (36.7 C)  TempSrc: Oral  SpO2: 94%  Weight: 126 lb 8 oz (57.4 kg)  Height: 5' 1.5 (1.562 m)    Body mass index is 23.51 kg/m.   Physical Exam Vitals reviewed.  Constitutional:      Appearance: Normal appearance.  HENT:     Head: Normocephalic and atraumatic.   Eyes:     Conjunctiva/sclera: Conjunctivae normal.    Cardiovascular:     Rate and Rhythm: Normal rate and  regular rhythm.  Pulmonary:     Effort: Pulmonary effort is normal.     Breath sounds: Normal breath sounds.   Skin:    General: Skin is warm and dry.   Neurological:     General: No focal deficit present.     Mental Status: She is alert and oriented to person, place, and time.   Psychiatric:        Mood and Affect: Mood normal.        Behavior: Behavior normal.        Thought Content: Thought content normal.        Judgment: Judgment normal.     Flowsheet Row Office Visit from 08/31/2023 in [facility] [place]  PHQ-9 Total Score 0     Impression and Plan:  IGT (impaired glucose tolerance) Assessment & Plan: Stable with an A1c of 5.9.  Continue lifestyle changes.  Orders: -     POCT glycosylated hemoglobin (Hb A1C)  Age-related osteoporosis with current pathological fracture with routine healing, subsequent encounter Assessment & Plan: On weekly alendronate .   Pulmonary emphysema, unspecified emphysema type (HCC) Assessment & Plan: Followed by pulmonary.   Essential hypertension Assessment & Plan: Well-controlled on current.      Time spent:30 minutes reviewing chart, interviewing and examining patient and formulating plan of care.     Tully Theophilus Andrews, MD [place] [facility]

## 2023-08-31 NOTE — Assessment & Plan Note (Signed)
 Stable with an A1c of 5.9.  Continue lifestyle changes.

## 2023-08-31 NOTE — Assessment & Plan Note (Signed)
 Well-controlled on current.

## 2023-08-31 NOTE — Assessment & Plan Note (Signed)
 Followed by pulmonary

## 2023-08-31 NOTE — Assessment & Plan Note (Signed)
 On weekly alendronate .

## 2023-09-30 DIAGNOSIS — H353131 Nonexudative age-related macular degeneration, bilateral, early dry stage: Secondary | ICD-10-CM | POA: Diagnosis not present

## 2023-12-02 DIAGNOSIS — J9611 Chronic respiratory failure with hypoxia: Secondary | ICD-10-CM | POA: Diagnosis not present

## 2023-12-02 DIAGNOSIS — J432 Centrilobular emphysema: Secondary | ICD-10-CM | POA: Diagnosis not present

## 2023-12-02 DIAGNOSIS — Z23 Encounter for immunization: Secondary | ICD-10-CM | POA: Diagnosis not present

## 2023-12-29 ENCOUNTER — Ambulatory Visit

## 2023-12-30 DIAGNOSIS — H26493 Other secondary cataract, bilateral: Secondary | ICD-10-CM | POA: Diagnosis not present

## 2023-12-30 DIAGNOSIS — H43813 Vitreous degeneration, bilateral: Secondary | ICD-10-CM | POA: Diagnosis not present

## 2023-12-30 DIAGNOSIS — H353131 Nonexudative age-related macular degeneration, bilateral, early dry stage: Secondary | ICD-10-CM | POA: Diagnosis not present

## 2023-12-30 DIAGNOSIS — H18513 Endothelial corneal dystrophy, bilateral: Secondary | ICD-10-CM | POA: Diagnosis not present

## 2024-01-04 ENCOUNTER — Ambulatory Visit: Admitting: Family Medicine

## 2024-01-04 DIAGNOSIS — Z0000 Encounter for general adult medical examination without abnormal findings: Secondary | ICD-10-CM

## 2024-01-04 NOTE — Progress Notes (Signed)
 PATIENT CHECK-IN and HEALTH RISK ASSESSMENT QUESTIONNAIRE:  -completed by phone/video for upcoming Medicare Preventive Visit  Pre-Visit Check-in: 1)Vitals (height, wt, BP, etc) - record in vitals section for visit on day of visit Request home vitals (wt, BP, etc.) and enter into vitals, THEN update Vital Signs SmartPhrase below at the top of the HPI. See below.  2)Review and Update Medications, Allergies PMH, Surgeries, Social history in Epic 3)Hospitalizations in the last year with date/reason? no  4)Review and Update Care Team (patient's specialists) in Epic 5) Complete PHQ9 in Epic  6) Complete Fall Screening in Epic 7)Review all Health Maintenance Due and order if not done.  Medicare Wellness Patient Questionnaire:  Answer theses question about your habits: How often do you have a drink containing alcohol? n How many drinks containing alcohol do you have on a typical day when you are drinking?na How often do you have six or more drinks on one occasion?na Have you ever smoked?y Quit date if applicable? Quit 8+ years ago  How many packs a day do/did you smoke? 1 pack per week, light, social Do you use smokeless tobacco?n Do you use an illicit drugs?n On average, how many days per week do you engage in moderate to strenuous exercise (like a brisk walk)?about 4+ days per week On average, how many minutes do you engage in exercise at this level? 30 minute, Does balance exercises several days a week, stationary bike 4 days per week, weights 4 days per wee Typical breakfast: usually cereal or waffle, or chicken biscuit, juice, fruit Typical lunch: sandwich Typical dinner: protein, veggies - loves broccoli Typical snacks: sometime chips - not much, sometimes pretzels, apple daily  Beverages: coffee, orange juice, water   Answer theses question about your everyday activities: Can you perform most household chores?y Are you deaf or have significant trouble hearing?n Do you feel that you  have a problem with memory?n Do you feel safe at home?y Last dentist visit?has dentures 8. Do you have any difficulty performing your everyday activities?n Are you having any difficulty walking, taking medications on your own, and or difficulty managing daily home needs?n Do you have difficulty walking or climbing stairs?n Do you have difficulty dressing or bathing?n Do you have difficulty doing errands alone such as visiting a doctor's office or shopping?n, but does not drive,  Do you currently have any difficulty preparing food and eating?n Do you currently have any difficulty using the toilet?n Do you have any difficulty managing your finances?n Do you have any difficulties with housekeeping of managing your housekeeping?n   Do you have Advanced Directives in place (Living Will, Healthcare Power or Attorney)? [email or web address]   Last eye Exam and location?had cataract surgery last year, has mac deg, Dr. Harrie   Do you currently use prescribed or non-prescribed narcotic or opioid pain medications?n  Do you have a history or close family history of breast, ovarian, tubal or peritoneal cancer or a family member with BRCA (breast cancer susceptibility 1 and 2) gene mutations? See pmh/fh     ----------------------------------------------------------------------------------------------------------------------------------------------------------------------------------------------------------------------  Because this visit was a virtual/telehealth visit, some criteria may be missing or patient reported. Any vitals not documented were not able to be obtained and vitals that have been documented are patient reported.    MEDICARE ANNUAL PREVENTIVE VISIT WITH PROVIDER: (Welcome to Medicare, initial annual wellness or annual wellness exam)  Virtual Visit via Phone Note  I connected with Beth [name] on 01/04/24 by phone  and verified that I am speaking with  the correct person using two  identifiers. Prefers phone.   Location patient: home Location provider:work or home office Persons participating in the virtual visit: patient, provider  Concerns and/or follow up today: no concerns at the moment.    See HM section in Epic for other details of completed HM.    ROS: negative for report of fevers, unintentional weight loss, vision changes, vision loss, hearing loss or change, chest pain, sob, hemoptysis, melena, hematochezia, hematuria, falls, bleeding or bruising, thoughts of suicide or self harm, memory loss  Patient-completed extensive health risk assessment - reviewed and discussed with the patient: See Health Risk Assessment completed with patient prior to the visit either above or in recent phone note. This was reviewed in detailed with the patient today and appropriate recommendations, orders and referrals were placed as needed per Summary below and patient instructions.   Review of Medical History: -PMH, PSH, Family History and current specialty and care providers reviewed and updated and listed below   Patient Care Team: Theophilus Andrews, Tully GRADE, MD as PCP - General (Internal Medicine) Karis Clunes, MD as Consulting Physician (Otolaryngology) Evern Donnajean Kayser, MD as Referring Physician (Orthopedic Surgery) Shellia Oh, MD as Consulting Physician (Pulmonary Disease) Harrie Agent, MD as Consulting Physician (Ophthalmology)   Past Medical History:  Diagnosis Date   Age-related osteoporosis with current pathological fracture 03/11/2017   DEXA bone scan at [facility] 03/29/2017 T score -3.1 at Rt total femur   COPD (chronic obstructive pulmonary disease) (HCC)    Coronary atherosclerosis of native coronary artery 07/22/2017   Seen on chest Ct 07/21/17. Aortic and branch vessel atherosclerosis. Normal heart size, without pericardial effusion. Multivessel coronary artery atherosclerosis.   Hypertension    Non-traumatic compression fracture of vertebral  column (HCC) 02/18/2017   Chest CT 07/21/17: Moderate T5 and moderate to severe T6 compression deformities w/o significant canal encroachment. Moderate T8 compression deformity is also w/o canal encroachment   Squamous cell cancer of external ear, right 03/2017   right, excison by Dr. Karis 04/2017    Past Surgical History:  Procedure Laterality Date   EYE SURGERY     MASS EXCISION Right 04/12/2017   Procedure: EXCISION OF RIGHT EAR  MASS;  Surgeon: Karis Clunes, MD;  Location: [facility];  Service: ENT;  Laterality: Right; squamous cell cancer with clear margins   SKIN FULL THICKNESS GRAFT Left 04/12/2017   Procedure: SKIN GRAFT FULL THICKNESS FROM ABDOMEN TO RIGHT EAR;  Surgeon: Karis Clunes, MD;  Location: [facility];  Service: ENT;  Laterality: Right   TONSILLECTOMY      Social History   Socioeconomic History   Marital status: Married    Spouse name: Not on file   Number of children: Not on file   Years of education: Not on file   Highest education level: 12th grade  Occupational History   Not on file  Tobacco Use   Smoking status: Former    Current packs/day: 0.00    Types: Cigarettes    Quit date: 02/03/2017    Years since quitting: 6.9   Smokeless tobacco: Never  Vaping Use   Vaping status: Never Used  Substance and Sexual Activity   Alcohol use: No   Drug use: No   Sexual activity: Not on file  Other Topics Concern   Not on file  Social History Narrative   Not on file   Social Drivers of Health   Financial Resource Strain: Low Risk  (01/03/2024)  Overall Financial Resource Strain (CARDIA)    Difficulty of Paying Living Expenses: Not hard at all  Food Insecurity: No Food Insecurity (01/03/2024)   Hunger Vital Sign    Worried About Running Out of Food in the Last Year: Never true    Ran Out of Food in the Last Year: Never true  Transportation Needs: No Transportation Needs (01/03/2024)   PRAPARE - Administrator, Civil Service  (Medical): No    Lack of Transportation (Non-Medical): No  Physical Activity: Insufficiently Active (01/03/2024)   Exercise Vital Sign    Days of Exercise per Week: 4 days    Minutes of Exercise per Session: 20 min  Stress: Patient Declined (01/03/2024)   [employer] of Occupational Health - Occupational Stress Questionnaire    Feeling of Stress: Patient declined  Social Connections: Unknown (01/03/2024)   Social Connection and Isolation Panel    Frequency of Communication with Friends and Family: Patient declined    Frequency of Social Gatherings with Friends and Family: Patient declined    Attends Religious Services: Patient declined    Database Administrator or Organizations: Patient declined    Attends Banker Meetings: Not on file    Marital Status: Married  Intimate Partner Violence: Not At Risk (02/15/2023)   Humiliation, Afraid, Rape, and Kick questionnaire    Fear of Current or Ex-Partner: No    Emotionally Abused: No    Physically Abused: No    Sexually Abused: No    Family History  Problem Relation Age of Onset   Heart attack Mother    Stroke Mother    Lung cancer Father    Arthritis Sister     Current Outpatient Medications on File Prior to Visit  Medication Sig Dispense Refill   acetaminophen  (TYLENOL ) 500 MG tablet Take 500 mg by mouth every 6 (six) hours as needed for mild pain or moderate pain.     albuterol  (VENTOLIN  HFA) 108 (90 Base) MCG/ACT inhaler Inhale 2 puffs into the lungs every 4 (four) hours as needed for wheezing or shortness of breath (cough, shortness of breath or wheezing.). 8 g 5   alendronate  (FOSAMAX ) 70 MG tablet Take 1 tablet (70 mg total) by mouth once a week. 12 tablet 2   aspirin  EC 81 MG tablet Take 1 tablet (81 mg total) by mouth daily. 30 tablet 11   hydrochlorothiazide  (HYDRODIURIL ) 25 MG tablet TAKE 1 TABLET EVERY DAY 90 tablet 1   naproxen sodium (ALEVE) 220 MG tablet Take 220 mg by mouth 2 (two) times daily as  needed (for pain).     rosuvastatin  (CRESTOR ) 10 MG tablet Take 1 tablet (10 mg total) by mouth daily. 90 tablet 1   TRELEGY ELLIPTA  100-62.5-25 MCG/ACT AEPB INHALE 1 PUFF INTO THE LUNGS DAILY. 180 each 10   No current facility-administered medications on file prior to visit.    Allergies  Allergen Reactions   Cefzil [Cefprozil] Other (See Comments)    Burning sensation in lower legs.    Zanaflex  [Tizanidine  Hcl] Nausea And Vomiting       Physical Exam Vitals requested from patient and listed below if patient had equipment and was able to obtain at home for this virtual visit: There were no vitals filed for this visit. Estimated body mass index is 23.51 kg/m as calculated from the following:   Height as of 08/31/23: 5' 1.5 (1.562 m).   Weight as of 08/31/23: 126 lb 8 oz (57.4 kg).  EKG (  optional): deferred due to virtual visit  GENERAL: alert, oriented, no acute distress detected, full vision exam deferred due to pandemic and/or virtual encounter   PSYCH/NEURO: pleasant and cooperative, no obvious depression or anxiety, speech and thought processing grossly intact, Cognitive function grossly intact  Flowsheet Row Office Visit from 08/31/2023 in [facility] [place]  PHQ-9 Total Score 0        01/04/2024    3:00 PM 08/31/2023   10:12 AM 02/16/2023    9:28 AM 08/13/2022    8:55 AM 02/12/2022    9:29 AM  Depression screen PHQ 2/9  Decreased Interest 0 0 0 0 0  Down, Depressed, Hopeless 0 0 0 0 0  PHQ - 2 Score 0 0 0 0 0  Altered sleeping  0 0 0 0  Tired, decreased energy  0 0 1 0  Change in appetite  0 0 0 0  Feeling bad or failure about yourself   0 0 0 0  Trouble concentrating  0 0 0 0  Moving slowly or fidgety/restless  0 0 0 0  Suicidal thoughts  0 0 0 0  PHQ-9 Score  0 0 1 0  Difficult doing work/chores  Not difficult at all  Not difficult at all Not difficult at all       08/25/2023    9:56 AM 08/31/2023   10:12 AM 12/28/2023   12:12 PM  01/03/2024    9:24 AM 01/04/2024    3:00 PM  Fall Risk  Falls in the past year? 0 0 0 0 0  Was there an injury with Fall?  0 0 0 0  Fall Risk Category Calculator  0 0  0  0  Fall risk Follow up  Falls evaluation completed   Falls evaluation completed;Education provided     Patient-reported     SUMMARY AND PLAN:  Medicare annual wellness visit, subsequent  Discussed applicable health maintenance/preventive health measures and advised and referred or ordered per patient preferences: -discussed vaccine, advised can get at the pharmacy if wishes to do -she does lung cancer screening with her pulmonologist -bone density and other measures utd Health Maintenance  Topic Date Due   COVID-19 Vaccine (1 - 2025-26 season) Never done   Lung Cancer Screening  04/28/2024   Medicare Annual Wellness (AWV)  01/03/2025   Fecal DNA (Cologuard)  03/11/2025   DTaP/Tdap/Td (3 - Tdap) 04/09/2030   Pneumococcal Vaccine: 50+ Years  Completed   Influenza Vaccine  Completed   DEXA SCAN  Completed   Hepatitis C Screening  Completed   Zoster Vaccines- Shingrix  Completed   Meningococcal B Vaccine  Aged Out   Mammogram  Discontinued      Education and counseling on the following was provided based on the above review of health and a plan/checklist for the patient, along with additional information discussed, was provided for the patient in the patient instructions :   -Advised and counseled on a healthy lifestyle  -Reviewed patient's current diet. Advised and counseled on a whole foods based healthy diet. Encouraged to increase veggies, water  and to decrease ultraprocessed foods. A summary of a healthy diet was provided in the Patient Instructions.  -reviewed patient's current physical activity level and discussed exercise guidelines for adults. Congratulated on healthy choices. Discussed  ideas for safe exercise to assist in meeting exercise guideline recommendations in a safe and healthy way. Further  resources provided in patient instructions.  -Advise yearly dental visits at minimum  and regular eye exams   Follow up: see patient instructions     Patient Instructions  I really enjoyed getting to talk with you today! I am available on Tuesdays and Thursdays for virtual visits if you have any questions or concerns, or if I can be of any further assistance.   CHECKLIST FROM ANNUAL WELLNESS VISIT:  -Follow up (please call to schedule if not scheduled after visit):   -yearly for annual wellness visit with primary care office  Here is a list of your preventive care/health maintenance measures and the plan for each if any are due:  PLAN For any measures below that may be due:    1. Can get vaccine at the pharmacy - please let us  know if you do so that we can update your record.  Health Maintenance  Topic Date Due   COVID-19 Vaccine (1 - 2025-26 season) Never done   Medicare Annual Wellness (AWV)  02/16/2024   Lung Cancer Screening  04/28/2024   Fecal DNA (Cologuard)  03/11/2025   DTaP/Tdap/Td (3 - Tdap) 04/09/2030   Pneumococcal Vaccine: 50+ Years  Completed   Influenza Vaccine  Completed   DEXA SCAN  Completed   Hepatitis C Screening  Completed   Zoster Vaccines- Shingrix  Completed   Meningococcal B Vaccine  Aged Out   Mammogram  Discontinued    -See a dentist at least yearly  -Get your eyes checked and then per your eye specialist's recommendations  -Other issues addressed today:   -I have included below further information regarding a healthy whole foods based diet, physical activity guidelines for adults, stress management and opportunities for social connections. I hope you find this information useful.    -----------------------------------------------------------------------------------------------------------------------------------------------------------------------------------------------------------------------------------------------------------    NUTRITION: -eat real food: lots of colorful vegetables (half the plate) and fruits -5-7 servings of vegetables and fruits per day (fresh or steamed is best), exp. 2 servings of vegetables with lunch and dinner and 2 servings of fruit per day. Berries and greens such as kale and collards are great choices.  -consume on a regular basis:  fresh fruits, fresh veggies, fish, nuts, seeds, healthy oils (such as olive oil, avocado oil), whole grains (make sure for bread/pasta/crackers/etc., that the first ingredient on label contains the word whole), legumes. -can eat small amounts of dairy and lean meat (no larger than the palm of your hand), but avoid processed meats such as ham, bacon, lunch meat, etc. -drink water  -try to avoid fast food and pre-packaged foods, processed meat, ultra processed foods/beverages (donuts, candy, etc.) -most experts advise limiting sodium to < 2300mg  per day, should limit further is any chronic conditions such as high blood pressure, heart disease, diabetes, etc. The American Heart Association advised that < 1500mg  is is ideal -try to avoid foods/beverages that contain any ingredients with names you do not recognize  -try to avoid foods/beverages  with added sugar or sweeteners/sweets  -try to avoid sweet drinks (including diet drinks): soda, juice, Gatorade, sweet tea, power drinks, diet drinks -try to avoid white rice, white bread, pasta (unless whole grain)  EXERCISE GUIDELINES FOR ADULTS: -if you wish to increase your physical activity, do so gradually and with the approval of your doctor -STOP and seek medical care immediately if you have any chest pain, chest discomfort or trouble breathing when starting or  increasing exercise  -move and stretch your body, legs, feet and arms when sitting for long periods -Physical activity guidelines for optimal health in adults: -get at least 150  minutes per week of moderate exercise (can talk, but not sing); this is about 20-30 minutes of sustained activity 5-7 days per week or two 10-15 minute episodes of sustained activity 5-7 days per week -do some muscle building/resistance training/strength training at least 2 days per week  -balance exercises 3+ days per week:   Stand somewhere where you have something sturdy to hold onto if you lose balance    1) lift up on toes, then back down, start with 5x per day and work up to 20x   2) stand and lift one leg straight out to the side so that foot is a few inches of the floor, start with 5x each side and work up to 20x each side   3) stand on one foot, start with 5 seconds each side and work up to 20 seconds on each side  If you need ideas or help with getting more active:  -Silver sneakers https://tools.silversneakers.com  -Walk with a Doc: Http://www.duncan-williams.com/  -try to include resistance (weight lifting/strength building) and balance exercises twice per week: or the following link for ideas: http://castillo-powell.com/  buyducts.dk  STRESS MANAGEMENT: -can try meditating, or just sitting quietly with deep breathing while intentionally relaxing all parts of your body for 5 minutes daily -if you need further help with stress, anxiety or depression please follow up with your primary doctor or contact the wonderful folks at [facility]: [phone]  SOCIAL CONNECTIONS: -options in [place] if you wish to engage in more social and exercise related activities:  -Silver sneakers https://tools.silversneakers.com  -Walk with a Doc: Http://www.duncan-williams.com/  -Check out the [facility] Active Adults 50+  section on the [place] of [employer] companies (hiking clubs, book clubs, cards and games, chess, exercise classes, aquatic classes and much more) - see the website for details: https://www.[place]-.gov/departments/parks-recreation/active-adults50  -YouTube has lots of exercise videos for different ages and abilities as well  -[facility] (a variety of indoor and outdoor inperson activities for adults). ([phone])539-9014. [address].  -Virtual Online Classes (a variety of topics): see seniorplanet.org or call [phone]  -consider volunteering at a school, hospice center, church, senior center or elsewhere            Chiquita JONELLE Cramp, DO

## 2024-01-04 NOTE — Patient Instructions (Signed)
 I really enjoyed getting to talk with you today! I am available on Tuesdays and Thursdays for virtual visits if you have any questions or concerns, or if I can be of any further assistance.   CHECKLIST FROM ANNUAL WELLNESS VISIT:  -Follow up (please call to schedule if not scheduled after visit):   -yearly for annual wellness visit with primary care office  Here is a list of your preventive care/health maintenance measures and the plan for each if any are due:  PLAN For any measures below that may be due:    1. Can get vaccine at the pharmacy - please let us  know if you do so that we can update your record.  Health Maintenance  Topic Date Due   COVID-19 Vaccine (1 - 2025-26 season) Never done   Medicare Annual Wellness (AWV)  02/16/2024   Lung Cancer Screening  04/28/2024   Fecal DNA (Cologuard)  03/11/2025   DTaP/Tdap/Td (3 - Tdap) 04/09/2030   Pneumococcal Vaccine: 50+ Years  Completed   Influenza Vaccine  Completed   DEXA SCAN  Completed   Hepatitis C Screening  Completed   Zoster Vaccines- Shingrix  Completed   Meningococcal B Vaccine  Aged Out   Mammogram  Discontinued    -See a dentist at least yearly  -Get your eyes checked and then per your eye specialist's recommendations  -Other issues addressed today:   -I have included below further information regarding a healthy whole foods based diet, physical activity guidelines for adults, stress management and opportunities for social connections. I hope you find this information useful.   -----------------------------------------------------------------------------------------------------------------------------------------------------------------------------------------------------------------------------------------------------------    NUTRITION: -eat real food: lots of colorful vegetables (half the plate) and fruits -5-7 servings of vegetables and fruits per day (fresh or steamed is best), exp. 2 servings of  vegetables with lunch and dinner and 2 servings of fruit per day. Berries and greens such as kale and collards are great choices.  -consume on a regular basis:  fresh fruits, fresh veggies, fish, nuts, seeds, healthy oils (such as olive oil, avocado oil), whole grains (make sure for bread/pasta/crackers/etc., that the first ingredient on label contains the word whole), legumes. -can eat small amounts of dairy and lean meat (no larger than the palm of your hand), but avoid processed meats such as ham, bacon, lunch meat, etc. -drink water  -try to avoid fast food and pre-packaged foods, processed meat, ultra processed foods/beverages (donuts, candy, etc.) -most experts advise limiting sodium to < 2300mg  per day, should limit further is any chronic conditions such as high blood pressure, heart disease, diabetes, etc. The American Heart Association advised that < 1500mg  is is ideal -try to avoid foods/beverages that contain any ingredients with names you do not recognize  -try to avoid foods/beverages  with added sugar or sweeteners/sweets  -try to avoid sweet drinks (including diet drinks): soda, juice, Gatorade, sweet tea, power drinks, diet drinks -try to avoid white rice, white bread, pasta (unless whole grain)  EXERCISE GUIDELINES FOR ADULTS: -if you wish to increase your physical activity, do so gradually and with the approval of your doctor -STOP and seek medical care immediately if you have any chest pain, chest discomfort or trouble breathing when starting or increasing exercise  -move and stretch your body, legs, feet and arms when sitting for long periods -Physical activity guidelines for optimal health in adults: -get at least 150 minutes per week of moderate exercise (can talk, but not sing); this is about 20-30 minutes of sustained activity  5-7 days per week or two 10-15 minute episodes of sustained activity 5-7 days per week -do some muscle building/resistance training/strength training  at least 2 days per week  -balance exercises 3+ days per week:   Stand somewhere where you have something sturdy to hold onto if you lose balance    1) lift up on toes, then back down, start with 5x per day and work up to 20x   2) stand and lift one leg straight out to the side so that foot is a few inches of the floor, start with 5x each side and work up to 20x each side   3) stand on one foot, start with 5 seconds each side and work up to 20 seconds on each side  If you need ideas or help with getting more active:  -Silver sneakers https://tools.silversneakers.com  -Walk with a Doc: Http://www.duncan-williams.com/  -try to include resistance (weight lifting/strength building) and balance exercises twice per week: or the following link for ideas: http://castillo-powell.com/  buyducts.dk  STRESS MANAGEMENT: -can try meditating, or just sitting quietly with deep breathing while intentionally relaxing all parts of your body for 5 minutes daily -if you need further help with stress, anxiety or depression please follow up with your primary doctor or contact the wonderful folks at [facility]: [phone]  SOCIAL CONNECTIONS: -options in [place] if you wish to engage in more social and exercise related activities:  -Silver sneakers https://tools.silversneakers.com  -Walk with a Doc: Http://www.duncan-williams.com/  -Check out the [facility] Active Adults 50+ section on the [place] of [employer] companies (hiking clubs, book clubs, cards and games, chess, exercise classes, aquatic classes and much more) - see the website for details: https://www.[place].gov/departments/parks-recreation/active-adults50  -YouTube has lots of exercise videos for different ages and abilities as well  -[facility] (a variety of indoor and outdoor inperson activities for adults). 8160582927. [address]  -Virtual Online Classes (a variety of topics): see seniorplanet.org or call [phone]  -consider volunteering at a school, hospice center, church, senior center or elsewhere

## 2024-01-06 ENCOUNTER — Other Ambulatory Visit: Payer: Self-pay | Admitting: Internal Medicine

## 2024-01-06 DIAGNOSIS — M810 Age-related osteoporosis without current pathological fracture: Secondary | ICD-10-CM

## 2024-01-12 DIAGNOSIS — H04123 Dry eye syndrome of bilateral lacrimal glands: Secondary | ICD-10-CM | POA: Diagnosis not present

## 2024-01-22 ENCOUNTER — Other Ambulatory Visit: Payer: Self-pay | Admitting: Internal Medicine

## 2024-01-22 DIAGNOSIS — I2510 Atherosclerotic heart disease of native coronary artery without angina pectoris: Secondary | ICD-10-CM

## 2024-01-22 DIAGNOSIS — R030 Elevated blood-pressure reading, without diagnosis of hypertension: Secondary | ICD-10-CM

## 2024-01-27 DIAGNOSIS — L814 Other melanin hyperpigmentation: Secondary | ICD-10-CM | POA: Diagnosis not present

## 2024-01-27 DIAGNOSIS — L821 Other seborrheic keratosis: Secondary | ICD-10-CM | POA: Diagnosis not present

## 2024-01-27 DIAGNOSIS — Z85828 Personal history of other malignant neoplasm of skin: Secondary | ICD-10-CM | POA: Diagnosis not present

## 2024-01-27 DIAGNOSIS — D225 Melanocytic nevi of trunk: Secondary | ICD-10-CM | POA: Diagnosis not present

## 2024-01-27 DIAGNOSIS — Z08 Encounter for follow-up examination after completed treatment for malignant neoplasm: Secondary | ICD-10-CM | POA: Diagnosis not present

## 2024-01-27 DIAGNOSIS — R233 Spontaneous ecchymoses: Secondary | ICD-10-CM | POA: Diagnosis not present

## 2024-02-17 ENCOUNTER — Ambulatory Visit: Admitting: Internal Medicine

## 2024-02-17 ENCOUNTER — Encounter: Payer: Self-pay | Admitting: Internal Medicine

## 2024-02-17 VITALS — BP 130/80 | HR 94 | Temp 98.1°F | Ht 61.5 in | Wt 128.3 lb

## 2024-02-17 DIAGNOSIS — J9611 Chronic respiratory failure with hypoxia: Secondary | ICD-10-CM

## 2024-02-17 DIAGNOSIS — M8000XD Age-related osteoporosis with current pathological fracture, unspecified site, subsequent encounter for fracture with routine healing: Secondary | ICD-10-CM

## 2024-02-17 DIAGNOSIS — Z0000 Encounter for general adult medical examination without abnormal findings: Secondary | ICD-10-CM

## 2024-02-17 DIAGNOSIS — R7302 Impaired glucose tolerance (oral): Secondary | ICD-10-CM

## 2024-02-17 DIAGNOSIS — I10 Essential (primary) hypertension: Secondary | ICD-10-CM

## 2024-02-17 DIAGNOSIS — J439 Emphysema, unspecified: Secondary | ICD-10-CM

## 2024-02-17 LAB — CBC WITH DIFFERENTIAL/PLATELET
Basophils Absolute: 0.1 K/uL (ref 0.0–0.1)
Basophils Relative: 0.8 % (ref 0.0–3.0)
Eosinophils Absolute: 0.1 K/uL (ref 0.0–0.7)
Eosinophils Relative: 0.9 % (ref 0.0–5.0)
HCT: 42.4 % (ref 36.0–46.0)
Hemoglobin: 14.3 g/dL (ref 12.0–15.0)
Lymphocytes Relative: 15.7 % (ref 12.0–46.0)
Lymphs Abs: 1.3 K/uL (ref 0.7–4.0)
MCHC: 33.8 g/dL (ref 30.0–36.0)
MCV: 90 fl (ref 78.0–100.0)
Monocytes Absolute: 0.7 K/uL (ref 0.1–1.0)
Monocytes Relative: 8.8 % (ref 3.0–12.0)
Neutro Abs: 6 K/uL (ref 1.4–7.7)
Neutrophils Relative %: 73.8 % (ref 43.0–77.0)
Platelets: 225 K/uL (ref 150.0–400.0)
RBC: 4.71 Mil/uL (ref 3.87–5.11)
RDW: 13.5 % (ref 11.5–15.5)
WBC: 8.2 K/uL (ref 4.0–10.5)

## 2024-02-17 LAB — LIPID PANEL
Cholesterol: 109 mg/dL (ref 0–200)
HDL: 60 mg/dL (ref >39.00)
LDL Cholesterol: 32 mg/dL (ref 0–99)
NonHDL: 48.78
Total CHOL/HDL Ratio: 2
Triglycerides: 84 mg/dL (ref 0.0–149.0)
VLDL: 16.8 mg/dL (ref 0.0–40.0)

## 2024-02-17 LAB — COMPREHENSIVE METABOLIC PANEL WITH GFR
ALT: 16 U/L (ref 0–35)
AST: 23 U/L (ref 0–37)
Albumin: 4.5 g/dL (ref 3.5–5.2)
Alkaline Phosphatase: 57 U/L (ref 39–117)
BUN: 14 mg/dL (ref 6–23)
CO2: 32 meq/L (ref 19–32)
Calcium: 9.7 mg/dL (ref 8.4–10.5)
Chloride: 98 meq/L (ref 96–112)
Creatinine, Ser: 0.66 mg/dL (ref 0.40–1.20)
GFR: 85.62 mL/min (ref >60.00)
Glucose, Bld: 126 mg/dL — ABNORMAL HIGH (ref 70–99)
Potassium: 4 meq/L (ref 3.5–5.1)
Sodium: 138 meq/L (ref 135–145)
Total Bilirubin: 0.5 mg/dL (ref 0.2–1.2)
Total Protein: 7.5 g/dL (ref 6.0–8.3)

## 2024-02-17 LAB — VITAMIN D 25 HYDROXY (VIT D DEFICIENCY, FRACTURES): VITD: 85.49 ng/mL (ref 30.00–100.00)

## 2024-02-17 LAB — VITAMIN B12: Vitamin B-12: 219 pg/mL (ref 211–911)

## 2024-02-17 LAB — HEMOGLOBIN A1C: Hgb A1c MFr Bld: 6.1 % (ref 4.6–6.5)

## 2024-02-17 LAB — TSH: TSH: 1.03 u[IU]/mL (ref 0.35–5.50)

## 2024-02-17 NOTE — Progress Notes (Signed)
 Established Patient Office Visit     CC/Reason for Visit: Annual preventive exam  HPI: Beth [name] is a 73 y.o. female who is coming in today for the above mentioned reasons. Past Medical History is significant for: COPD with chronic hypoxemic respiratory failure, hypertension, hyperlipidemia, impaired glucose tolerance, history of ptosis.  Feeling well without concerns or complaints.  Has routine eye and dental care.  Is due for COVID-vaccine.  DEXA and cancer screening is up-to-date.   Past Medical/Surgical History: Past Medical History:  Diagnosis Date   Age-related osteoporosis with current pathological fracture 03/11/2017   DEX[facility] bone [facility] 03/29/2017 T score -3.1 at Rt total femur   COPD (chronic obstructive pulmonary disease) (HCC)    Coronary atherosclerosis of native coronary artery 07/22/2017   Seen on chest Ct 07/21/17. Aortic and branch vessel atherosclerosis. Normal heart size, without pericardial effusion. Multivessel coronary artery atherosclerosis.   Hypertension    Non-traumatic compression fracture of vertebral column (HCC) 02/18/2017   Chest CT 07/21/17: Moderate T5 and moderate to severe T6 compression deformities w/o significant canal encroachment. Moderate T8 compression deformity is also w/o canal encroachment   Squamous cell cancer of external ear, right 03/2017   right, excison by Dr. Karis 04/2017    Past Surgical History:  Procedure Laterality Date   EYE SURGERY     MASS EXCISION Right 04/12/2017   Procedure: EXCISION OF RIGHT EAR  MASS;  Surgeon: Karis Clunes, MD;  Location: [facility];  Service: ENT;  Laterality: Right; squamous cell cancer with clear margins   SKIN FULL THICKNESS GRAFT Left 04/12/2017   Procedure: SKIN GRAFT FULL THICKNESS FROM ABDOMEN TO RIGHT EAR;  Surgeon: Karis Clunes, MD;  Location: [facility];  Service: ENT;  Laterality: Right   TONSILLECTOMY      Social History:  reports that she quit  smoking about 7 years ago. Her smoking use included cigarettes. She has never used smokeless tobacco. She reports that she does not drink alcohol and does not use drugs.  Allergies: Allergies[1]  Family History:  Family History  Problem Relation Age of Onset   Heart attack Mother    Stroke Mother    Lung cancer Father    Arthritis Sister     Current Medications[2]  Review of Systems:  Negative unless indicated in HPI.   Physical Exam: Vitals:   02/17/24 0752  BP: 130/80  Pulse: 94  Temp: 98.1 F (36.7 C)  TempSrc: Oral  SpO2: 92%  Weight: 128 lb 4.8 oz (58.2 kg)  Height: 5' 1.5 (1.562 m)    Body mass index is 23.85 kg/m.   Physical Exam Vitals reviewed.  Constitutional:      General: She is not in acute distress.    Appearance: Normal appearance. She is not ill-appearing, toxic-appearing or diaphoretic.  HENT:     Head: Normocephalic.     Right Ear: Tympanic membrane, ear canal and external ear normal. There is no impacted cerumen.     Left Ear: Tympanic membrane, ear canal and external ear normal. There is no impacted cerumen.     Nose: Nose normal.     Mouth/Throat:     Mouth: Mucous membranes are moist.     Pharynx: Oropharynx is clear. No oropharyngeal exudate or posterior oropharyngeal erythema.  Eyes:     General: No scleral icterus.       Right eye: No discharge.        Left eye:  No discharge.     Conjunctiva/sclera: Conjunctivae normal.     Pupils: Pupils are equal, round, and reactive to light.  Neck:     Vascular: No carotid bruit.  Cardiovascular:     Rate and Rhythm: Normal rate and regular rhythm.     Pulses: Normal pulses.     Heart sounds: Normal heart sounds.  Pulmonary:     Effort: Pulmonary effort is normal. No respiratory distress.     Breath sounds: Normal breath sounds.  Abdominal:     General: Abdomen is flat. Bowel sounds are normal.     Palpations: Abdomen is soft.  Musculoskeletal:        General: Normal range of motion.      Cervical back: Normal range of motion.  Skin:    General: Skin is warm and dry.  Neurological:     General: No focal deficit present.     Mental Status: She is alert and oriented to person, place, and time. Mental status is at baseline.  Psychiatric:        Mood and Affect: Mood normal.        Behavior: Behavior normal.        Thought Content: Thought content normal.        Judgment: Judgment normal.      Impression and Plan:  Encounter for preventive health examination  Essential hypertension -     CBC with Differential/Platelet; Future -     Comprehensive metabolic panel with GFR; Future -     Lipid panel; Future  IGT (impaired glucose tolerance) -     Hemoglobin A1c; Future  Age-related osteoporosis with current pathological fracture with routine healing, subsequent encounter -     VITAMIN D  25 Hydroxy (Vit-D Deficiency, Fractures); Future  Chronic respiratory failure with hypoxia (HCC) -     TSH; Future -     Vitamin B12; Future  Chronic obstructive pulmonary disease with emphysema, unspecified emphysema type (HCC)   -Recommend routine eye and dental care. -Healthy lifestyle discussed in detail. -Labs to be updated today. -Prostate cancer screening: N/A Health Maintenance  Topic Date Due   COVID-19 Vaccine (1 - 2025-26 season) Never done   Medicare Annual Wellness Visit  01/03/2025   Cologuard (Stool DNA test)  03/11/2025   DTaP/Tdap/Td vaccine (3 - Tdap) 04/09/2030   Pneumococcal Vaccine for age over 43  Completed   Flu Shot  Completed   Osteoporosis screening with Bone Density Scan  Completed   Hepatitis C Screening  Completed   Zoster (Shingles) Vaccine  Completed   Meningitis B Vaccine  Aged Out   Screening for Lung Cancer  Discontinued   Breast Cancer Screening  Discontinued     - Advised to update COVID-vaccine.     Tully Theophilus Andrews, MD [facility]     [1]  Allergies Allergen Reactions   Cefzil  [Cefprozil] Other (See Comments)    Burning sensation in lower legs.    Zanaflex  [Tizanidine  Hcl] Nausea And Vomiting  [2]  Current Outpatient Medications:    acetaminophen  (TYLENOL ) 500 MG tablet, Take 500 mg by mouth every 6 (six) hours as needed for mild pain or moderate pain., Disp: , Rfl:    albuterol  (VENTOLIN  HFA) 108 (90 Base) MCG/ACT inhaler, Inhale 2 puffs into the lungs every 4 (four) hours as needed for wheezing or shortness of breath (cough, shortness of breath or wheezing.)., Disp: 8 g, Rfl: 5   alendronate  (FOSAMAX ) 70 MG tablet, TAKE  1 TABLET ONCE A WEEK, Disp: 12 tablet, Rfl: 3   aspirin  EC 81 MG tablet, Take 1 tablet (81 mg total) by mouth daily., Disp: 30 tablet, Rfl: 11   hydrochlorothiazide  (HYDRODIURIL ) 25 MG tablet, TAKE 1 TABLET EVERY DAY, Disp: 90 tablet, Rfl: 1   naproxen sodium (ALEVE) 220 MG tablet, Take 220 mg by mouth 2 (two) times daily as needed (for pain)., Disp: , Rfl:    rosuvastatin  (CRESTOR ) 10 MG tablet, TAKE 1 TABLET EVERY DAY, Disp: 90 tablet, Rfl: 1   TRELEGY ELLIPTA  100-62.5-25 MCG/ACT AEPB, INHALE 1 PUFF INTO THE LUNGS DAILY., Disp: 180 each, Rfl: 10

## 2024-02-28 ENCOUNTER — Ambulatory Visit: Payer: Self-pay | Admitting: Internal Medicine

## 2024-08-17 ENCOUNTER — Ambulatory Visit: Admitting: Internal Medicine
